# Patient Record
Sex: Female | Born: 1955 | Race: Black or African American | Hispanic: No | Smoking: Never smoker
Health system: Southern US, Community
[De-identification: ages and names within clinical notes are randomized; demographics above are authoritative.]

## PROBLEM LIST (undated history)

## (undated) DIAGNOSIS — R011 Cardiac murmur, unspecified: Secondary | ICD-10-CM

## (undated) DIAGNOSIS — J45909 Unspecified asthma, uncomplicated: Secondary | ICD-10-CM

## (undated) DIAGNOSIS — D649 Anemia, unspecified: Secondary | ICD-10-CM

## (undated) DIAGNOSIS — T7840XA Allergy, unspecified, initial encounter: Secondary | ICD-10-CM

## (undated) DIAGNOSIS — K259 Gastric ulcer, unspecified as acute or chronic, without hemorrhage or perforation: Secondary | ICD-10-CM

## (undated) DIAGNOSIS — I10 Essential (primary) hypertension: Secondary | ICD-10-CM

## (undated) DIAGNOSIS — F419 Anxiety disorder, unspecified: Secondary | ICD-10-CM

## (undated) HISTORY — PX: DILATION AND CURETTAGE OF UTERUS: SHX78

## (undated) HISTORY — DX: Gastric ulcer, unspecified as acute or chronic, without hemorrhage or perforation: K25.9

## (undated) HISTORY — PX: WISDOM TOOTH EXTRACTION: SHX21

## (undated) HISTORY — PX: OTHER SURGICAL HISTORY: SHX169

## (undated) HISTORY — DX: Anxiety disorder, unspecified: F41.9

## (undated) HISTORY — DX: Cardiac murmur, unspecified: R01.1

## (undated) HISTORY — DX: Essential (primary) hypertension: I10

## (undated) HISTORY — DX: Allergy, unspecified, initial encounter: T78.40XA

## (undated) HISTORY — DX: Anemia, unspecified: D64.9

---

## 1986-05-29 HISTORY — PX: PILONIDAL CYST EXCISION: SHX744

## 2010-11-22 ENCOUNTER — Inpatient Hospital Stay (INDEPENDENT_AMBULATORY_CARE_PROVIDER_SITE_OTHER)
Admission: RE | Admit: 2010-11-22 | Discharge: 2010-11-22 | Disposition: A | Discharge status: Home / Self Care | Payer: Self-pay | Source: Ambulatory Visit | Attending: Family Medicine | Admitting: Family Medicine

## 2010-11-22 DIAGNOSIS — I10 Essential (primary) hypertension: Secondary | ICD-10-CM

## 2010-11-22 LAB — POCT I-STAT, CHEM 8
Calcium, Ion: 1.16 mmol/L (ref 1.12–1.32)
Glucose, Bld: 113 mg/dL — ABNORMAL HIGH (ref 70–99)
HCT: 40 % (ref 36.0–46.0)
Hemoglobin: 13.6 g/dL (ref 12.0–15.0)

## 2011-05-30 DIAGNOSIS — K259 Gastric ulcer, unspecified as acute or chronic, without hemorrhage or perforation: Secondary | ICD-10-CM

## 2011-05-30 HISTORY — DX: Gastric ulcer, unspecified as acute or chronic, without hemorrhage or perforation: K25.9

## 2011-12-24 ENCOUNTER — Inpatient Hospital Stay (HOSPITAL_COMMUNITY)
Admission: EM | Admit: 2011-12-24 | Discharge: 2011-12-28 | DRG: 383 | Disposition: A | Discharge status: Home / Self Care | Payer: PRIVATE HEALTH INSURANCE | Attending: Internal Medicine | Admitting: Internal Medicine

## 2011-12-24 ENCOUNTER — Encounter (HOSPITAL_COMMUNITY): Payer: Self-pay | Admitting: Cardiology

## 2011-12-24 ENCOUNTER — Emergency Department (HOSPITAL_COMMUNITY): Payer: PRIVATE HEALTH INSURANCE

## 2011-12-24 ENCOUNTER — Emergency Department (HOSPITAL_COMMUNITY)
Admission: EM | Admit: 2011-12-24 | Discharge: 2011-12-24 | Disposition: A | Discharge status: Nursing Home (Includes ICF) | Payer: Self-pay | Source: Home / Self Care | Attending: Family Medicine | Admitting: Family Medicine

## 2011-12-24 DIAGNOSIS — IMO0001 Reserved for inherently not codable concepts without codable children: Secondary | ICD-10-CM | POA: Diagnosis present

## 2011-12-24 DIAGNOSIS — J45909 Unspecified asthma, uncomplicated: Secondary | ICD-10-CM | POA: Diagnosis present

## 2011-12-24 DIAGNOSIS — R1013 Epigastric pain: Secondary | ICD-10-CM | POA: Diagnosis present

## 2011-12-24 DIAGNOSIS — K859 Acute pancreatitis without necrosis or infection, unspecified: Secondary | ICD-10-CM | POA: Diagnosis present

## 2011-12-24 DIAGNOSIS — R0789 Other chest pain: Secondary | ICD-10-CM

## 2011-12-24 DIAGNOSIS — Z6841 Body Mass Index (BMI) 40.0 and over, adult: Secondary | ICD-10-CM

## 2011-12-24 DIAGNOSIS — R112 Nausea with vomiting, unspecified: Secondary | ICD-10-CM | POA: Diagnosis present

## 2011-12-24 DIAGNOSIS — R030 Elevated blood-pressure reading, without diagnosis of hypertension: Secondary | ICD-10-CM | POA: Diagnosis present

## 2011-12-24 DIAGNOSIS — R110 Nausea: Secondary | ICD-10-CM

## 2011-12-24 DIAGNOSIS — Z7982 Long term (current) use of aspirin: Secondary | ICD-10-CM

## 2011-12-24 DIAGNOSIS — J4520 Mild intermittent asthma, uncomplicated: Secondary | ICD-10-CM | POA: Diagnosis present

## 2011-12-24 DIAGNOSIS — K269 Duodenal ulcer, unspecified as acute or chronic, without hemorrhage or perforation: Principal | ICD-10-CM | POA: Diagnosis present

## 2011-12-24 DIAGNOSIS — Z79899 Other long term (current) drug therapy: Secondary | ICD-10-CM

## 2011-12-24 HISTORY — DX: Unspecified asthma, uncomplicated: J45.909

## 2011-12-24 LAB — COMPREHENSIVE METABOLIC PANEL
ALT: 17 U/L (ref 0–35)
AST: 25 U/L (ref 0–37)
Albumin: 3.6 g/dL (ref 3.5–5.2)
CO2: 27 mEq/L (ref 19–32)
Calcium: 9 mg/dL (ref 8.4–10.5)
GFR calc non Af Amer: >90 mL/min (ref >90)
Sodium: 139 mEq/L (ref 135–145)
Total Protein: 7.6 g/dL (ref 6.0–8.3)

## 2011-12-24 LAB — CBC WITH DIFFERENTIAL/PLATELET
Basophils Absolute: 0 10*3/uL (ref 0.0–0.1)
Eosinophils Absolute: 0.1 10*3/uL (ref 0.0–0.7)
Lymphocytes Relative: 44 % (ref 12–46)
Lymphs Abs: 3.1 10*3/uL (ref 0.7–4.0)
MCH: 24.9 pg — ABNORMAL LOW (ref 26.0–34.0)
Neutrophils Relative %: 43 % (ref 43–77)
Platelets: 312 10*3/uL (ref 150–400)
RBC: 4.49 MIL/uL (ref 3.87–5.11)
WBC: 6.9 10*3/uL (ref 4.0–10.5)

## 2011-12-24 LAB — POCT I-STAT, CHEM 8
Hemoglobin: 12.2 g/dL (ref 12.0–15.0)
Sodium: 140 mEq/L (ref 135–145)
TCO2: 23 mmol/L (ref 0–100)

## 2011-12-24 LAB — POCT URINALYSIS DIP (DEVICE)
Leukocytes, UA: NEGATIVE
Protein, ur: NEGATIVE mg/dL
Urobilinogen, UA: 0.2 mg/dL (ref 0.0–1.0)

## 2011-12-24 LAB — TROPONIN I: Troponin I: <0.3 ng/mL (ref <0.30)

## 2011-12-24 MED ORDER — SODIUM CHLORIDE 0.9 % IV BOLUS (SEPSIS)
1000.0000 mL | Freq: Once | INTRAVENOUS | Status: AC
  Administered 2011-12-24: 1000 mL via INTRAVENOUS

## 2011-12-24 MED ORDER — SODIUM CHLORIDE 0.9 % IV SOLN: INTRAVENOUS | Status: DC

## 2011-12-24 MED ORDER — ONDANSETRON HCL 4 MG/2ML IJ SOLN
4.0000 mg | Freq: Once | INTRAMUSCULAR | Status: AC
  Administered 2011-12-24: 4 mg via INTRAVENOUS
  Filled 2011-12-24: qty 2

## 2011-12-24 MED ORDER — ASPIRIN 81 MG PO CHEW
CHEWABLE_TABLET | ORAL | Status: AC
  Filled 2011-12-24: qty 4

## 2011-12-24 MED ORDER — ASPIRIN 81 MG PO CHEW
324.0000 mg | CHEWABLE_TABLET | Freq: Once | ORAL | Status: AC
  Administered 2011-12-24: 324 mg via ORAL

## 2011-12-24 MED ORDER — ONDANSETRON HCL 4 MG/2ML IJ SOLN
INTRAMUSCULAR | Status: AC
  Filled 2011-12-24: qty 2

## 2011-12-24 MED ORDER — ONDANSETRON HCL 4 MG/2ML IJ SOLN
4.0000 mg | Freq: Once | INTRAMUSCULAR | Status: AC
  Administered 2011-12-24: 4 mg via INTRAVENOUS

## 2011-12-24 NOTE — ED Notes (Signed)
Patient transfered from Urgent Care via [facility]. [facility] reports onset of chest pain 2 months ago. Currently pain free.

## 2011-12-24 NOTE — Progress Notes (Signed)
10:58 PM 56 yo woman sent from urgent care center because of "chest pain."  What she had was abdominal pain.  EKG and cardiac markers negative.  Lipase elevated at 283.  Recommend abdominal Ultrasound to check for gallstones.

## 2011-12-24 NOTE — ED Notes (Signed)
[facility] notified for transport 

## 2011-12-24 NOTE — ED Provider Notes (Signed)
History     CSN: [id number]  Arrival date & time 12/24/11  1641   First MD Initiated Contact with Patient 12/24/11 1710      Chief Complaint  Patient presents with  . Nausea  . Dizziness  . Abdominal Pain    (Consider location/radiation/quality/duration/timing/severity/associated sxs/prior treatment) HPI Comments: 56 year old female nondiabetic here complaining of epigastric pain associated with diaphoresis, dizziness and chest pressure intermittently for the last 2 months. Worse is getting outside or exerting. Recurrent symptoms today patient reports epigastric pain associated with cold sweat, dizziness and nausea after eating lunch at 11 AM also reports associated feeling of chest heaviness "like an elephant sitting in my chest"  here with persistent epigastric discomfort and nausea also reports persistent chest pressure type of discomfort.    Past Medical History  Diagnosis Date  . Asthma     as child    History reviewed. No pertinent past surgical history.  No family history on file.  History  Substance Use Topics  . Smoking status: Never Smoker   . Smokeless tobacco: Not on file  . Alcohol Use: No    OB History    Grav Para Term Preterm Abortions TAB SAB Ect Mult Living                  Review of Systems  Constitutional: Positive for diaphoresis. Negative for fever and chills.  Respiratory: Positive for chest tightness.   Cardiovascular: Negative for palpitations and leg swelling.  Gastrointestinal: Positive for nausea and abdominal pain. Negative for vomiting and diarrhea.  Genitourinary: Negative for dysuria.  Skin: Negative for rash.  Neurological: Positive for dizziness.  All other systems reviewed and are negative.    Allergies  Penicillins  Home Medications   Current Outpatient Rx  Name Route Sig Dispense Refill  . ASPIRIN 325 MG PO TABS Oral Take 325 mg by mouth daily.    Marland Kitchen CALCIUM CARBONATE ANTACID 500 MG PO CHEW Oral Chew 2 tablets by mouth  as needed.      BP 197/88  Pulse 70  Temp 97.9 F (36.6 C) (Oral)  Resp 20  SpO2 98%  Physical Exam  Nursing note and vitals reviewed. Constitutional: She is oriented to person, place, and time. She appears well-developed and well-nourished.       Looks uncomfortable  HENT:  Head: Normocephalic and atraumatic.  Mouth/Throat: No oropharyngeal exudate.  Eyes: Conjunctivae and EOM are normal. Pupils are equal, round, and reactive to light. No scleral icterus.  Neck: Neck supple. No JVD present. No thyromegaly present.  Cardiovascular: Normal rate, regular rhythm and normal heart sounds.  Exam reveals no gallop and no friction rub.   No murmur heard. Pulmonary/Chest: Effort normal and breath sounds normal. No respiratory distress. She has no wheezes. She has no rales. She exhibits no tenderness.  Abdominal: Soft. Bowel sounds are normal. She exhibits no distension and no mass. There is no rebound and no guarding.       Epigastric tenderness  Lymphadenopathy:    She has no cervical adenopathy.  Neurological: She is alert and oriented to person, place, and time.  Skin: No rash noted.    ED Course  Procedures (including critical care time)   Labs Reviewed  POCT URINALYSIS DIP (DEVICE)  POCT I-STAT, CHEM 8   No results found.   1. Nausea   2. Epigastric pain   3. Chest discomfort       MDM  56 year old female nondiabetic here complaining of epigastric pain  associated with diaphoresis and chest pressure intermittently for the last 2 months. Recurrent symptoms today patient reports epigastric pain diaphoresis and nausea after eating lunch at 11 AM here with persistent epigastric discomfort and nausea also reports perssitent chest pressure. Non diaphoretic here.  EKG: Ventricular rate 59 beats per minute. With T-wave he inversions in D1, D2 aVR aVL and lateral leads (V5,V6). No ST changes or other acute ischemic changes. Vital signs stable with blood pressure with a wide  differential 197/88. Possible angina pectoris, concerned about acute coronary syndrome vs thoracic aortic disease. Decided to transfer to the emergency department via EMS. Ondansetron 4 mg IV administered at [facility] prior to discharge.         Sharin Grave, MD 12/25/11 1136

## 2011-12-24 NOTE — ED Notes (Addendum)
Pt reports nausea, dizziness, abdominal pain that feels like a "runners cramp" for the 2 months. Pt thought is was the flu back in June but has not felt good sent. Pt had diarrhea episode at the onset of symptoms back in June with bright red blood in it. Pt reports temp at home 98.7 that feels warm for her. Pt has not been able to drink water as she was 2 weeks ago now only able to sip. Pt reports chest feeling like something is sitting on chest and won't move. Worse when outside.

## 2011-12-24 NOTE — ED Notes (Signed)
Pt given 4 baby aspirin prior to departure for [facility] ED. Report given to [facility] with [facility]. Pt to [facility] ED.

## 2011-12-24 NOTE — ED Notes (Signed)
Patient C/O nausea for 2 months.  Nausea is intermittent and is brought on by drinking water. Patient is asymptomatic at this time. States that she cannot handle feeling bad any longer.

## 2011-12-24 NOTE — ED Notes (Signed)
Report called to [place] Ana at [facility] ED.

## 2011-12-25 ENCOUNTER — Emergency Department (HOSPITAL_COMMUNITY): Payer: PRIVATE HEALTH INSURANCE

## 2011-12-25 ENCOUNTER — Encounter (HOSPITAL_COMMUNITY): Payer: Self-pay | Admitting: *Deleted

## 2011-12-25 DIAGNOSIS — R112 Nausea with vomiting, unspecified: Secondary | ICD-10-CM | POA: Diagnosis present

## 2011-12-25 DIAGNOSIS — K859 Acute pancreatitis without necrosis or infection, unspecified: Secondary | ICD-10-CM

## 2011-12-25 DIAGNOSIS — J45909 Unspecified asthma, uncomplicated: Secondary | ICD-10-CM

## 2011-12-25 DIAGNOSIS — R030 Elevated blood-pressure reading, without diagnosis of hypertension: Secondary | ICD-10-CM

## 2011-12-25 DIAGNOSIS — J4520 Mild intermittent asthma, uncomplicated: Secondary | ICD-10-CM | POA: Diagnosis present

## 2011-12-25 DIAGNOSIS — IMO0001 Reserved for inherently not codable concepts without codable children: Secondary | ICD-10-CM | POA: Diagnosis present

## 2011-12-25 DIAGNOSIS — R1013 Epigastric pain: Secondary | ICD-10-CM | POA: Diagnosis present

## 2011-12-25 DIAGNOSIS — R52 Pain, unspecified: Secondary | ICD-10-CM

## 2011-12-25 LAB — POCT I-STAT TROPONIN I

## 2011-12-25 MED ORDER — ONDANSETRON HCL 4 MG/2ML IJ SOLN: 4.0000 mg | Freq: Four times a day (QID) | INTRAMUSCULAR | Status: DC | PRN

## 2011-12-25 MED ORDER — ONDANSETRON HCL 4 MG/2ML IJ SOLN
4.0000 mg | Freq: Once | INTRAMUSCULAR | Status: AC
  Administered 2011-12-25: 4 mg via INTRAVENOUS
  Filled 2011-12-25: qty 2

## 2011-12-25 MED ORDER — ONDANSETRON HCL 4 MG PO TABS: 4.0000 mg | ORAL_TABLET | Freq: Four times a day (QID) | ORAL | Status: DC | PRN

## 2011-12-25 MED ORDER — MORPHINE SULFATE 2 MG/ML IJ SOLN
2.0000 mg | INTRAMUSCULAR | Status: DC | PRN
  Administered 2011-12-25: 2 mg via INTRAVENOUS
  Filled 2011-12-25: qty 2

## 2011-12-25 MED ORDER — HYDRALAZINE HCL 20 MG/ML IJ SOLN
5.0000 mg | INTRAMUSCULAR | Status: DC | PRN
  Administered 2011-12-25 – 2011-12-28 (×5): 5 mg via INTRAVENOUS
  Filled 2011-12-25 (×3): qty 0.25

## 2011-12-25 MED ORDER — MORPHINE SULFATE 4 MG/ML IJ SOLN: 4.0000 mg | INTRAMUSCULAR | Status: DC | PRN

## 2011-12-25 MED ORDER — SODIUM CHLORIDE 0.9 % IV SOLN
INTRAVENOUS | Status: AC
  Administered 2011-12-25 (×2): via INTRAVENOUS

## 2011-12-25 MED ORDER — SODIUM CHLORIDE 0.9 % IV SOLN
INTRAVENOUS | Status: DC
  Administered 2011-12-25: 04:00:00 via INTRAVENOUS

## 2011-12-25 MED ORDER — SODIUM CHLORIDE 0.9 % IV SOLN: INTRAVENOUS | Status: DC

## 2011-12-25 MED ORDER — HYDROMORPHONE HCL PF 1 MG/ML IJ SOLN
1.0000 mg | INTRAMUSCULAR | Status: AC
  Administered 2011-12-25: 1 mg via INTRAVENOUS
  Filled 2011-12-25: qty 1

## 2011-12-25 MED ORDER — MORPHINE SULFATE 2 MG/ML IJ SOLN
2.0000 mg | INTRAMUSCULAR | Status: DC | PRN
  Administered 2011-12-25: 2 mg via INTRAVENOUS
  Filled 2011-12-25 (×2): qty 1

## 2011-12-25 MED ORDER — MORPHINE SULFATE 2 MG/ML IJ SOLN
1.0000 mg | INTRAMUSCULAR | Status: DC | PRN
  Administered 2011-12-25: 1 mg via INTRAVENOUS
  Filled 2011-12-25: qty 1

## 2011-12-25 MED ORDER — ONDANSETRON HCL 4 MG/2ML IJ SOLN: 4.0000 mg | Freq: Three times a day (TID) | INTRAMUSCULAR | Status: DC | PRN

## 2011-12-25 NOTE — ED Notes (Signed)
Patient to US via stretcher

## 2011-12-25 NOTE — ED Notes (Signed)
Patient continues to await [place] of abd. States that she pain medication help a little. No acute distress noted at present.

## 2011-12-25 NOTE — ED Provider Notes (Signed)
Medical screening examination/treatment/procedure(s) were performed by non-physician practitioner and as supervising physician I was immediately available for consultation/collaboration.   Can Lucci M Belinda Bringhurst, DO 12/25/11 1038 

## 2011-12-25 NOTE — Progress Notes (Signed)
Notified Craige Cotta, NP by text page that patient's BP is 175/79 HR 58. No call returned. Will continue to monitor patient. Nelda Marseille, RN

## 2011-12-25 NOTE — Progress Notes (Signed)
-  NPO, continue IV fluids IV narcotics. Alejandra Vega sign positive, with significant epigastric tenderness. -Get a renal ultrasound to rule out stones. -LFTs within normal limits triglycerides within normal limits she does not consume alcohol.

## 2011-12-25 NOTE — ED Notes (Signed)
Patient reports having abd pain. EDP

## 2011-12-25 NOTE — ED Provider Notes (Signed)
4:09 AM Spoke with Dr. Onalee Hua who will accept the patient for admission for pancreatitis. Discussed with patient and Dr. Clarene Duke , Who agree with plan   Results for orders placed during the hospital encounter of 12/24/11  CBC WITH DIFFERENTIAL      Component Value Range   WBC 6.9  4.0 - 10.5 K/uL   RBC 4.49  3.87 - 5.11 MIL/uL   Hemoglobin 11.2 (*) 12.0 - 15.0 g/dL   HCT 16.1 (*) 09.6 - 04.5 %   MCV 78.2  78.0 - 100.0 fL   MCH 24.9 (*) 26.0 - 34.0 pg   MCHC 31.9  30.0 - 36.0 g/dL   RDW 40.9 (*) 81.1 - 91.4 %   Platelets 312  150 - 400 K/uL   Neutrophils Relative 43  43 - 77 %   Neutro Abs 3.0  1.7 - 7.7 K/uL   Lymphocytes Relative 44  12 - 46 %   Lymphs Abs 3.1  0.7 - 4.0 K/uL   Monocytes Relative 10  3 - 12 %   Monocytes Absolute 0.7  0.1 - 1.0 K/uL   Eosinophils Relative 2  0 - 5 %   Eosinophils Absolute 0.1  0.0 - 0.7 K/uL   Basophils Relative 1  0 - 1 %   Basophils Absolute 0.0  0.0 - 0.1 K/uL  TROPONIN I      Component Value Range   Troponin I <0.30  <0.30 ng/mL  COMPREHENSIVE METABOLIC PANEL      Component Value Range   Sodium 139  135 - 145 mEq/L   Potassium 4.2  3.5 - 5.1 mEq/L   Chloride 105  96 - 112 mEq/L   CO2 27  19 - 32 mEq/L   Glucose, Bld 97  70 - 99 mg/dL   BUN 9  6 - 23 mg/dL   Creatinine, Ser 7.82  0.50 - 1.10 mg/dL   Calcium 9.0  8.4 - 95.6 mg/dL   Total Protein 7.6  6.0 - 8.3 g/dL   Albumin 3.6  3.5 - 5.2 g/dL   AST 25  0 - 37 U/L   ALT 17  0 - 35 U/L   Alkaline Phosphatase 75  39 - 117 U/L   Total Bilirubin 0.4  0.3 - 1.2 mg/dL   GFR calc non Af Amer >90  >90 mL/min   GFR calc Af Amer >90  >90 mL/min  LIPASE, BLOOD      Component Value Range   Lipase 283 (*) 11 - 59 U/L   Dg Chest 2 View  12/24/2011  *RADIOLOGY REPORT*  Clinical Data: Epigastric pain off and on for 2 months.  Shortness of breath.  CHEST - 2 VIEW  Comparison: None  Findings: Heart is mildly enlarged.  No focal consolidations or pleural effusions.  No pulmonary edema. Visualized  osseous structures have a normal appearance.  IMPRESSION:  1.  Cardiomegaly. 2. No evidence for acute pulmonary abnormality.  Original Report Authenticated By: Patterson Hammersmith, M.D.   US Abdomen Complete  12/25/2011  *RADIOLOGY REPORT*  Clinical Data:  Epigastric pain, elevated lipase  COMPLETE ABDOMINAL ULTRASOUND  Comparison:  None.  Findings:  Gallbladder:  No gallstones, gallbladder wall thickening, or pericholecystic fluid.  Common bile duct:  Measures 4 mm where seen proximally.  The distal duct is obscured.  Liver:  Mildly heterogeneous/increased in echogenicity.  This limits focal liver lesion detection.  IVC:  Appears normal.  Pancreas:  Poorly visualized due to overlying bowel gas  artifact.  Spleen:  Measures proximally 9 cm.  No focal abnormality.  Right Kidney:  Measures 11.0 cm.  No hydronephrosis or focal abnormality.  Left Kidney:  Measures 11.6 cm.  Hydronephrosis or focal abnormality.  Abdominal aorta:  Measures up to 2.2 cm proximally.  The distal aorta / bifurcation is obscured by overlying bowel gas artifact.  IMPRESSION: No cholelithiasis or sonographic evidence for cholecystitis.  Heterogeneous liver echogenicity may reflect fatty infiltration. Focal lesion detection is limited in the setting.  Pancreas is poorly visualized.  Original Report Authenticated By: Waneta Martins, M.D.      Thomasene Lot, PA-C 12/25/11 0410

## 2011-12-25 NOTE — ED Provider Notes (Signed)
History     CSN: [id number]  Arrival date & time 12/24/11  1952   First MD Initiated Contact with Patient 12/24/11 2033      Chief Complaint  Patient presents with  . Chest Pain    (Consider location/radiation/quality/duration/timing/severity/associated sxs/prior treatment) HPI  Pt to the ER from the Urgent Care with complaints of epigastric pain. She denies having chest pain or SOB but does admit that she has had some chest pressure over the past month but not in the past couple of days. She believes that she is having indigestion and feels that she is fine. She denies that she has had vomiting or diarrhea but admits to extreme nausea.  She denies having LUQ or RLQ abdominal pain.She has not had any temperature. She is in NAD and is unsure of why the UC sent her here.   Past Medical History  Diagnosis Date  . Asthma     as child    No past surgical history on file.  No family history on file.  History  Substance Use Topics  . Smoking status: Never Smoker   . Smokeless tobacco: Not on file  . Alcohol Use: No    OB History    Grav Para Term Preterm Abortions TAB SAB Ect Mult Living                  Review of Systems   HEENT: denies blurry vision or change in hearing PULMONARY: Denies difficulty breathing and SOB CARDIAC: denies chest pain or heart palpitations MUSCULOSKELETAL:  denies being unable to ambulate ABDOMEN AL: denies diarrhea GU: denies loss of bowel or urinary control NEURO: denies numbness and tingling in extremities SKIN: no new rashes PSYCH: patient denies anxiety or depression. NECK: Pt denies having neck pain     Allergies  Glutethimides; Penicillins; Shrimp; and Pork-derived products  Home Medications   Current Outpatient Rx  Name Route Sig Dispense Refill  . ASPIRIN 325 MG PO TABS Oral Take 325 mg by mouth daily.    Marland Kitchen CALCIUM CARBONATE ANTACID 500 MG PO CHEW Oral Chew 2 tablets by mouth as needed.    Marland Kitchen CETIRIZINE HCL 10 MG PO TABS  Oral Take 10 mg by mouth daily.    Marland Kitchen NAPHAZOLINE-GLYCERIN 0.012-0.2 % OP SOLN Both Eyes Place 1-2 drops into both eyes every 4 (four) hours as needed. For allergy relief      BP 150/97  Pulse 70  Resp 18  SpO2 98%  Physical Exam  Nursing note and vitals reviewed. Constitutional: She appears well-developed and well-nourished. No distress.  HENT:  Head: Normocephalic and atraumatic.  Eyes: Pupils are equal, round, and reactive to light.  Neck: Normal range of motion. Neck supple.  Cardiovascular: Normal rate and regular rhythm.   Pulmonary/Chest: Effort normal. She has no wheezes. She has no rales.  Abdominal: Soft. Bowel sounds are normal. There is tenderness in the epigastric area. There is no rigidity, no rebound, no guarding, no CVA tenderness, no tenderness at McBurney's point and negative Murphy's sign.    Neurological: She is alert.  Skin: Skin is warm and dry.    ED Course  Procedures (including critical care time)  Labs Reviewed  CBC WITH DIFFERENTIAL - Abnormal; Notable for the following:    Hemoglobin 11.2 (*)     HCT 35.1 (*)     MCH 24.9 (*)     RDW 15.8 (*)     All other components within normal limits  LIPASE, BLOOD -  Abnormal; Notable for the following:    Lipase 283 (*)     All other components within normal limits  TROPONIN I  COMPREHENSIVE METABOLIC PANEL   Dg Chest 2 View  12/24/2011  *RADIOLOGY REPORT*  Clinical Data: Epigastric pain off and on for 2 months.  Shortness of breath.  CHEST - 2 VIEW  Comparison: None  Findings: Heart is mildly enlarged.  No focal consolidations or pleural effusions.  No pulmonary edema. Visualized osseous structures have a normal appearance.  IMPRESSION:  1.  Cardiomegaly. 2. No evidence for acute pulmonary abnormality.  Original Report Authenticated By: Patterson Hammersmith, M.D.     No diagnosis found.    MDM  Dr. Ignacia Palma has seen patient as well.  Highly doubt patients symptoms are cardiac. Patient lipase is  elevated, no hx of pancreatitis, or gall bladder disease. She denies being a big alcohol drinker. Dr. Ignacia Palma has advised that I order a US abdomen to r/o gall bladder disease.   End of shift care, pt still waiting for [place] abd, pt care handed over to [place], [place].       Dorthula Matas, PA 12/25/11 [phone]

## 2011-12-25 NOTE — Progress Notes (Signed)
Patient's B/P 196/85. Dr. Truitt Merle. Will continue to monitor.

## 2011-12-25 NOTE — ED Notes (Signed)
Pt updated about poss admission to hosp

## 2011-12-25 NOTE — H&P (Signed)
Chief Complaint:  abd pain  HPI: 56 yo female with on/off epi abd pain for over a week with radiation to ruq and associated nausea with minimal vomiting.  Nonbloody.  She thought it was heartburn but over the last several days has gotten severe.  No diarrhea.  No fevers.  Still has gallbladder.  Asked to admit for acute panc,.  Review of Systems:  O/w neg no new meds recetnly  Past Medical History: Past Medical History  Diagnosis Date  . Asthma     as child   No past surgical history on file.  Medications: Prior to Admission medications   Medication Sig Start Date End Date Taking? Authorizing Provider  aspirin 325 MG tablet Take 325 mg by mouth daily.   Yes Historical Provider, MD  calcium carbonate (TUMS - DOSED IN MG ELEMENTAL CALCIUM) 500 MG chewable tablet Chew 2 tablets by mouth as needed.   Yes Historical Provider, MD  cetirizine (ZYRTEC) 10 MG tablet Take 10 mg by mouth daily.   Yes Historical Provider, MD  naphazoline-glycerin (CLEAR EYES) 0.012-0.2 % SOLN Place 1-2 drops into both eyes every 4 (four) hours as needed. For allergy relief   Yes Historical Provider, MD    Allergies:   Allergies  Allergen Reactions  . Glutethimides Shortness Of Breath  . Penicillins Hives, Shortness Of Breath, Itching and Rash  . Shrimp (Shellfish Allergy) Anaphylaxis  . Pork-Derived Products Other (See Comments)    Head ache     Social History:  reports that she has never smoked. She does not have any smokeless tobacco history on file. She reports that she does not drink alcohol or use illicit drugs.  Physical Exam: Filed Vitals:   12/25/11 0100 12/25/11 0200 12/25/11 0330 12/25/11 0400  BP: 153/94 146/95 151/96 135/87  Pulse: 65 61 68 61  Resp: 16 16 15 13   SpO2: 96% 98% 97% 97%   General appearance: alert, cooperative and no distress Lungs: clear to auscultation bilaterally Heart: regular rate and rhythm, S1, S2 normal, no murmur, click, rub or gallop Abdomen: soft,  non-tender; bowel sounds normal; no masses,  no organomegaly Extremities: extremities normal, atraumatic, no cyanosis or edema Pulses: 2+ and symmetric Skin: Skin color, texture, turgor normal. No rashes or lesions Neurologic: Grossly normal    Labs on Admission:   [facility] 12/24/11 2058 12/24/11 1807  NA 139 140  K 4.2 3.9  CL 105 106  CO2 27 --  GLUCOSE 97 93  BUN 9 10  CREATININE 0.67 0.80  CALCIUM 9.0 --  MG -- --  PHOS -- --    Basename 12/24/11 2058  AST 25  ALT 17  ALKPHOS 75  BILITOT 0.4  PROT 7.6  ALBUMIN 3.6    Basename 12/24/11 2058  LIPASE 283*  AMYLASE --    Basename 12/24/11 2058 12/24/11 1807  WBC 6.9 --  NEUTROABS 3.0 --  HGB 11.2* 12.2  HCT 35.1* 36.0  MCV 78.2 --  PLT 312 --    Basename 12/24/11 2100  CKTOTAL --  CKMB --  CKMBINDEX --  TROPONINI <0.30   Radiological Exams on Admission: Dg Chest 2 View  12/24/2011  *RADIOLOGY REPORT*  Clinical Data: Epigastric pain off and on for 2 months.  Shortness of breath.  CHEST - 2 VIEW  Comparison: None  Findings: Heart is mildly enlarged.  No focal consolidations or pleural effusions.  No pulmonary edema. Visualized osseous structures have a normal appearance.  IMPRESSION:  1.  Cardiomegaly. 2. No  evidence for acute pulmonary abnormality.  Original Report Authenticated By: Patterson Hammersmith, M.D.   US Abdomen Complete  12/25/2011  *RADIOLOGY REPORT*  Clinical Data:  Epigastric pain, elevated lipase  COMPLETE ABDOMINAL ULTRASOUND  Comparison:  None.  Findings:  Gallbladder:  No gallstones, gallbladder wall thickening, or pericholecystic fluid.  Common bile duct:  Measures 4 mm where seen proximally.  The distal duct is obscured.  Liver:  Mildly heterogeneous/increased in echogenicity.  This limits focal liver lesion detection.  IVC:  Appears normal.  Pancreas:  Poorly visualized due to overlying bowel gas artifact.  Spleen:  Measures proximally 9 cm.  No focal abnormality.  Right Kidney:  Measures  11.0 cm.  No hydronephrosis or focal abnormality.  Left Kidney:  Measures 11.6 cm.  Hydronephrosis or focal abnormality.  Abdominal aorta:  Measures up to 2.2 cm proximally.  The distal aorta / bifurcation is obscured by overlying bowel gas artifact.  IMPRESSION: No cholelithiasis or sonographic evidence for cholecystitis.  Heterogeneous liver echogenicity may reflect fatty infiltration. Focal lesion detection is limited in the setting.  Pancreas is poorly visualized.  Original Report Authenticated By: Waneta Martins, M.D.    Assessment/Plan Present on Admission:  56 yo female with acute pancreatitis .Nausea & vomiting .Abdominal pain, acute, epigastric .Acute pancreatitis .Asthma .Elevated BP  Acute panc, npo ice chips.  Bowel rest.  Ivf.  lfts and alk phos nml.  May have passed a stone.  Will eventually need surgical eval to see if needs gallbladder out.  Ck trig level.    Ludia Gartland A [phone] 12/25/2011, 4:38 AM

## 2011-12-25 NOTE — ED Provider Notes (Signed)
Medical screening examination/treatment/procedure(s) were conducted as a shared visit with non-physician practitioner(s) and myself.  I personally evaluated the patient during the encounter 10:58 PM  56 yo woman sent from urgent care center because of "chest pain." What she had was abdominal pain. EKG and cardiac markers negative. Lipase elevated at 283. Recommend abdominal Ultrasound to check for gallstones.        Carleene Cooper III, MD 12/25/11 [phone]

## 2011-12-25 NOTE — ED Notes (Signed)
Patient continues to await [place]. No reports of pain.

## 2011-12-25 NOTE — Progress Notes (Signed)
Shyenne Maggard [id number] Admission Data: 12/25/2011 10:03 AM Attending Provider: Marinda Elk, MD  PCP:No primary provider on file. Consults/ Treatment Team:    Allia Sylvain is a 56 y.o. female patient admitted from ED awake, alert  & orientated  X 3,  Full Code, VSS - Blood pressure 123/77, pulse 66, temperature 98.9 F (37.2 C), temperature source Oral, resp. rate 18, SpO2 99.00%., no c/o shortness of breath, no distress noted. .   IV site WDL:  hand left, condition patent and no redness with a transparent dsg that's clean dry and intact.  Allergies:   Allergies  Allergen Reactions  . Glutethimides Shortness Of Breath  . Penicillins Hives, Shortness Of Breath, Itching and Rash  . Shrimp (Shellfish Allergy) Anaphylaxis  . Pork-Derived Products Other (See Comments)    Head ache      Past Medical History  Diagnosis Date  . Asthma     as child    History:  obtained from the patient. Tobacco/alcohol: denied none  Pt orientation to unit, room and routine. Information packet given to patient/family and safety video watched.  Admission INP armband ID verified with patient/family, and in place. SR up x 2, fall risk assessment complete with Patient and family verbalizing understanding of risks associated with falls. Pt verbalizes an understanding of how to use the call bell and to call for help before getting out of bed.  Skin, clean-dry- intact without evidence of bruising, or skin tears.   No evidence of skin break down noted on exam. no rashes, no ecchymoses, no petechiae, no nodules, no jaundice, no purpura, no wounds    Will cont to monitor and assist as needed.  Adilson Grafton Consuella Lose, RN 12/25/2011 10:03 AM

## 2011-12-26 DIAGNOSIS — R112 Nausea with vomiting, unspecified: Secondary | ICD-10-CM

## 2011-12-26 LAB — CBC
Hemoglobin: 11.8 g/dL — ABNORMAL LOW (ref 12.0–15.0)
Platelets: 327 10*3/uL (ref 150–400)
RBC: 4.72 MIL/uL (ref 3.87–5.11)
WBC: 6.9 10*3/uL (ref 4.0–10.5)

## 2011-12-26 LAB — COMPREHENSIVE METABOLIC PANEL
AST: 25 U/L (ref 0–37)
BUN: 6 mg/dL (ref 6–23)
CO2: 29 mEq/L (ref 19–32)
Calcium: 9.6 mg/dL (ref 8.4–10.5)
Creatinine, Ser: 0.73 mg/dL (ref 0.50–1.10)
GFR calc Af Amer: >90 mL/min (ref >90)
GFR calc non Af Amer: >90 mL/min (ref >90)
Glucose, Bld: 98 mg/dL (ref 70–99)

## 2011-12-26 MED ORDER — DEXTROSE-NACL 5-0.45 % IV SOLN
INTRAVENOUS | Status: DC
  Administered 2011-12-26 – 2011-12-27 (×2): via INTRAVENOUS

## 2011-12-26 MED ORDER — ACETAMINOPHEN 325 MG PO TABS
650.0000 mg | ORAL_TABLET | Freq: Once | ORAL | Status: AC
  Administered 2011-12-26: 650 mg via ORAL
  Filled 2011-12-26: qty 2

## 2011-12-26 NOTE — Progress Notes (Signed)
[facility] PROGRESS NOTE  Alejandra Vega EXB:[id number] DOB: Mar 27, 1956 DOA: 12/24/2011   Assessment/Plan: Patient Active Hospital Problem List: Acute pancreatitis (12/25/2011) -continues to have abdominal pain, she tried ice chips and exacerbated her pain. Continue n.p.o., continue IV fluids and IV narcotics. -Unclear etiology, we'll check an ANA.Marland Kitchen  Asthma () -stable continue to monitor.  Elevated BP without diagnosis of hypertension (12/25/2011) -her blood pressure continues to be elevated been fluctuating this most likely secondary to pain.  Code Status: Full code Family Communication: Sister Disposition Plan: To be determined   LOS: 2 days   Subjective: She relates that it shouldn't made her stomach hurt. Is comfortable laying in bed will like to continue n.p.o.  Objective: Filed Vitals:   12/26/11 0453 12/26/11 0605 12/26/11 0642 12/26/11 1320  BP: 189/91 187/83 163/73 151/89  Pulse: 68   71  Temp: 98.3 F (36.8 C)   98.1 F (36.7 C)  TempSrc: Oral   Oral  Resp:      Height:      Weight:      SpO2: 95%   98%    Intake/Output Summary (Last 24 hours) at 12/26/11 1636 Last data filed at 12/26/11 1300  Gross per 24 hour  Intake    915 ml  Output      0 ml  Net    915 ml   Weight change:   Exam:  General: Alert, awake, oriented x3, in no acute distress.  HEENT: No bruits, no goiter.  Heart: Regular rate and rhythm, without murmurs, rubs, gallops.  Lungs: Good air movement, bilateral air movement.  Abdomen: Soft, , nondistended epigastric tenderness positive bowel sounds.   Data Reviewed: Basic Metabolic Panel:  Lab 12/26/11 1027 12/24/11 2058 12/24/11 1807  NA 139 139 140  K 3.8 4.2 --  CL 101 105 106  CO2 29 27 --  GLUCOSE 98 97 93  BUN 6 9 10   CREATININE 0.73 0.67 0.80  CALCIUM 9.6 9.0 --  MG -- -- --  PHOS -- -- --   Liver Function Tests:  Lab 12/26/11 0606 12/24/11 2058  AST 25 25  ALT 17 17  ALKPHOS 76 75  BILITOT 0.5 0.4    PROT 7.8 7.6  ALBUMIN 3.7 3.6    Lab 12/24/11 2058  LIPASE 283*  AMYLASE --   No results found for this basename: AMMONIA:5 in the last 168 hours CBC:  Lab 12/26/11 0606 12/24/11 2058 12/24/11 1807  WBC 6.9 6.9 --  NEUTROABS -- 3.0 --  HGB 11.8* 11.2* 12.2  HCT 37.0 35.1* 36.0  MCV 78.4 78.2 --  PLT 327 312 --   Cardiac Enzymes:  Lab 12/24/11 2100  CKTOTAL --  CKMB --  CKMBINDEX --  TROPONINI <0.30   BNP: No components found with this basename: POCBNP:5 CBG: No results found for this basename: GLUCAP:5 in the last 168 hours  No results found for this or any previous visit (from the past 240 hour(s)).   Studies: Dg Chest 2 View  12/24/2011  *RADIOLOGY REPORT*  Clinical Data: Epigastric pain off and on for 2 months.  Shortness of breath.  CHEST - 2 VIEW  Comparison: None  Findings: Heart is mildly enlarged.  No focal consolidations or pleural effusions.  No pulmonary edema. Visualized osseous structures have a normal appearance.  IMPRESSION:  1.  Cardiomegaly. 2. No evidence for acute pulmonary abnormality.  Original Report Authenticated By: Patterson Hammersmith, M.D.   US Abdomen Complete  12/25/2011  *RADIOLOGY REPORT*  Clinical Data:  Epigastric pain, elevated lipase  COMPLETE ABDOMINAL ULTRASOUND  Comparison:  None.  Findings:  Gallbladder:  No gallstones, gallbladder wall thickening, or pericholecystic fluid.  Common bile duct:  Measures 4 mm where seen proximally.  The distal duct is obscured.  Liver:  Mildly heterogeneous/increased in echogenicity.  This limits focal liver lesion detection.  IVC:  Appears normal.  Pancreas:  Poorly visualized due to overlying bowel gas artifact.  Spleen:  Measures proximally 9 cm.  No focal abnormality.  Right Kidney:  Measures 11.0 cm.  No hydronephrosis or focal abnormality.  Left Kidney:  Measures 11.6 cm.  Hydronephrosis or focal abnormality.  Abdominal aorta:  Measures up to 2.2 cm proximally.  The distal aorta / bifurcation is  obscured by overlying bowel gas artifact.  IMPRESSION: No cholelithiasis or sonographic evidence for cholecystitis.  Heterogeneous liver echogenicity may reflect fatty infiltration. Focal lesion detection is limited in the setting.  Pancreas is poorly visualized.  Original Report Authenticated By: Waneta Martins, M.D.    Scheduled Meds:   . acetaminophen  650 mg Oral Once   Continuous Infusions:   . sodium chloride 75 mL/hr at 12/25/11 1807  . dextrose 5 % and 0.45% NaCl 75 mL/hr at 12/26/11 1255    Lambert Keto, MD  [facility] Pager [phone]  If 7PM-7AM, please contact night-coverage [email or web address] Password [facility] 12/26/2011, 4:36 PM

## 2011-12-26 NOTE — Progress Notes (Signed)
Text paged Craige Cotta, NP two times patient requesting tynenol for headache. Notified Craige Cotta, NP that patient's BP is 187/83, gave hydrazaline IV as ordered, follow up BP 167/73.  Patient states her headache is a little better now. No call returned. Will continue to monitor patient. Nelda Marseille, RN

## 2011-12-27 LAB — ANA: Anti Nuclear Antibody(ANA): NEGATIVE

## 2011-12-27 MED ORDER — ALUM & MAG HYDROXIDE-SIMETH 200-200-20 MG/5ML PO SUSP: 15.0000 mL | ORAL | Status: DC | PRN

## 2011-12-27 MED ORDER — PANTOPRAZOLE SODIUM 40 MG PO TBEC
40.0000 mg | DELAYED_RELEASE_TABLET | Freq: Every day | ORAL | Status: DC
  Administered 2011-12-27 – 2011-12-28 (×2): 40 mg via ORAL
  Filled 2011-12-27: qty 1
  Filled 2011-12-27: qty 2

## 2011-12-27 NOTE — Progress Notes (Signed)
Patient ambulated in hallway with RN this morning. Will continue to monitor patient. Alejandra Vega

## 2011-12-27 NOTE — Progress Notes (Signed)
[facility] PROGRESS NOTE  Alejandra Vega WUJ:[id number] DOB: 1956/01/17 DOA: 12/24/2011   Assessment/Plan: Abdominal pain - suspect patient she may have actually duodenal ulcer - she tells me that her pain is better when eating.  - start PPI and resume diet    Asthma () -stable continue to monitor.  Elevated BP without diagnosis of hypertension (12/25/2011) -her blood pressure continues to be elevated - she may have HTN - will see how she does after po intake is resumed   Code Status: Full code Family Communication: Sister Disposition Plan: To be determined   LOS: 3 days   Subjective: Wants food because it helps with the pain   Objective: Filed Vitals:   12/26/11 2122 12/26/11 2205 12/26/11 2257 12/27/11 0350  BP:  179/95 165/93 161/101  Pulse: 64   75  Temp: 98 F (36.7 C)   98 F (36.7 C)  TempSrc: Oral   Oral  Resp: 18   20  Height:      Weight:      SpO2: 98%   95%    Intake/Output Summary (Last 24 hours) at 12/27/11 1252 Last data filed at 12/27/11 0900  Gross per 24 hour  Intake    815 ml  Output      0 ml  Net    815 ml   Weight change:   Exam:  General: Alert, awake, oriented x3, in no acute distress.  HEENT: No bruits, no goiter.  Heart: Regular rate and rhythm, without murmurs, rubs, gallops.  Lungs: Good air movement, bilateral air movement.  Abdomen: Soft, , has mild epigastric tenderness,  positive bowel sounds.   Data Reviewed: Basic Metabolic Panel:  Lab 12/26/11 9562 12/24/11 2058 12/24/11 1807  NA 139 139 140  K 3.8 4.2 --  CL 101 105 106  CO2 29 27 --  GLUCOSE 98 97 93  BUN 6 9 10   CREATININE 0.73 0.67 0.80  CALCIUM 9.6 9.0 --  MG -- -- --  PHOS -- -- --   Liver Function Tests:  Lab 12/26/11 0606 12/24/11 2058  AST 25 25  ALT 17 17  ALKPHOS 76 75  BILITOT 0.5 0.4  PROT 7.8 7.6  ALBUMIN 3.7 3.6    Lab 12/24/11 2058  LIPASE 283*  AMYLASE --   No results found for this basename: AMMONIA:5 in the last 168  hours CBC:  Lab 12/26/11 0606 12/24/11 2058 12/24/11 1807  WBC 6.9 6.9 --  NEUTROABS -- 3.0 --  HGB 11.8* 11.2* 12.2  HCT 37.0 35.1* 36.0  MCV 78.4 78.2 --  PLT 327 312 --   Cardiac Enzymes:  Lab 12/24/11 2100  CKTOTAL --  CKMB --  CKMBINDEX --  TROPONINI <0.30    Studies: Dg Chest 2 View  12/24/2011  *RADIOLOGY REPORT*  Clinical Data: Epigastric pain off and on for 2 months.  Shortness of breath.  CHEST - 2 VIEW  Comparison: None  Findings: Heart is mildly enlarged.  No focal consolidations or pleural effusions.  No pulmonary edema. Visualized osseous structures have a normal appearance.  IMPRESSION:  1.  Cardiomegaly. 2. No evidence for acute pulmonary abnormality.  Original Report Authenticated By: Patterson Hammersmith, M.D.   US Abdomen Complete  12/25/2011  *RADIOLOGY REPORT*  Clinical Data:  Epigastric pain, elevated lipase  COMPLETE ABDOMINAL ULTRASOUND  Comparison:  None.  Findings:  Gallbladder:  No gallstones, gallbladder wall thickening, or pericholecystic fluid.  Common bile duct:  Measures 4 mm where seen proximally.  The distal duct is obscured.  Liver:  Mildly heterogeneous/increased in echogenicity.  This limits focal liver lesion detection.  IVC:  Appears normal.  Pancreas:  Poorly visualized due to overlying bowel gas artifact.  Spleen:  Measures proximally 9 cm.  No focal abnormality.  Right Kidney:  Measures 11.0 cm.  No hydronephrosis or focal abnormality.  Left Kidney:  Measures 11.6 cm.  Hydronephrosis or focal abnormality.  Abdominal aorta:  Measures up to 2.2 cm proximally.  The distal aorta / bifurcation is obscured by overlying bowel gas artifact.  IMPRESSION: No cholelithiasis or sonographic evidence for cholecystitis.  Heterogeneous liver echogenicity may reflect fatty infiltration. Focal lesion detection is limited in the setting.  Pancreas is poorly visualized.  Original Report Authenticated By: Waneta Martins, M.D.    Scheduled Meds:    . pantoprazole   40 mg Oral Q1200   Continuous Infusions:    . DISCONTD: dextrose 5 % and 0.45% NaCl 75 mL/hr at 12/27/11 0021    Alejandra Vega [phone] If 7PM-7AM, please contact night-coverage [email or web address] Password [facility] 12/27/2011, 12:52 PM

## 2011-12-28 MED ORDER — ACETAMINOPHEN 500 MG PO TABS: 500.0000 mg | ORAL_TABLET | Freq: Four times a day (QID) | ORAL | Status: AC | PRN

## 2011-12-28 MED ORDER — TRIAMTERENE-HCTZ 37.5-25 MG PO CAPS: 1.0000 | ORAL_CAPSULE | ORAL | Status: DC

## 2011-12-28 MED ORDER — ACETAMINOPHEN 325 MG PO TABS
650.0000 mg | ORAL_TABLET | Freq: Four times a day (QID) | ORAL | Status: DC | PRN
  Administered 2011-12-28: 650 mg via ORAL

## 2011-12-28 MED ORDER — CAPSAICIN 0.1 % EX CREA: TOPICAL_CREAM | CUTANEOUS | Status: DC

## 2011-12-28 MED ORDER — OMEPRAZOLE 20 MG PO CPDR: 20.0000 mg | DELAYED_RELEASE_CAPSULE | Freq: Every day | ORAL | Status: DC

## 2011-12-28 NOTE — Discharge Summary (Signed)
Physician Discharge Summary  Alejandra Vega WUJ:[id number] DOB: September 23, 1955 DOA: 12/24/2011  PCP: No primary provider on file.  Admit date: 12/24/2011 Discharge date: 12/28/2011  Recommendations for Outpatient Follow-up:  1. Patient will need followup for hypertension 2. If patient has persistent abdominal pain she will need outpatient referral to gastroenterology for endoscopy  Discharge Diagnoses:  1. acute abdominal pain-most likely due to duodenal ulcer with probable mild acute pancreatitis 2. Hypertension History of asthma   Discharge Condition: Good  Diet recommendation: Regular diet  Wt Readings from Last 3 Encounters:  12/25/11 109.402 kg (241 lb 3 oz)    History of present illness:  56 year old woman presented to the emergency room with worsening progressive abdominal pain. She was found to have an elevated lipase and was referred for admission.   Hospital Course:  1. abdominal pain - Patient was kept initially n.p.o. but without much improvement in her abdominal pain. Once we resumed a diet and place the patient on a proton pump inhibitor she felt significantly better fast. I suspect that she may have a small duodenal ulcer may be related to nsaid use. Plan is to treat her for one month's with PPI and if she does not improve to refer to gastroenterology for endoscopy. 2. Hypertension - patient was initiated on a diuretic at discharge and she was urged to follow up with her primary care physician    Procedures:  Abdominal US  Consultations:  None  Discharge Exam: Filed Vitals:   12/28/11 0650  BP: 146/91  Pulse: 91  Temp:   Resp:    Filed Vitals:   12/27/11 2125 12/27/11 2250 12/28/11 0613 12/28/11 0650  BP: 176/88 168/78 186/102 146/91  Pulse: 75 81 81 91  Temp:  98.2 F (36.8 C) 98.2 F (36.8 C)   TempSrc:  Oral Oral   Resp:   22   Height:      Weight:      SpO2:   98%     General: Alert and oriented x3 Cardiovascular: Regular rate and  rhythm Respiratory: Clear to auscultation bilaterally Abdomen soft nontender bowel sounds are present  Discharge Instructions  Discharge Orders    Future Orders Please Complete By Expires   Diet - low sodium heart healthy      Increase activity slowly        Medication List  As of 12/28/2011 11:08 AM   STOP taking these medications         aspirin 325 MG tablet      naphazoline-glycerin 0.012-0.2 % Soln         TAKE these medications         acetaminophen 500 MG tablet   Commonly known as: TYLENOL   Take 1 tablet (500 mg total) by mouth every 6 (six) hours as needed for pain.      calcium carbonate 500 MG chewable tablet   Commonly known as: TUMS - dosed in mg elemental calcium   Chew 2 tablets by mouth as needed.      Capsaicin 0.1 % Crea   To the right knee as needed      cetirizine 10 MG tablet   Commonly known as: ZYRTEC   Take 10 mg by mouth daily.      omeprazole 20 MG capsule   Commonly known as: PRILOSEC   Take 1 capsule (20 mg total) by mouth daily.      triamterene-hydrochlorothiazide 37.5-25 MG per capsule   Commonly known as: DYAZIDE   Take  1 each (1 capsule total) by mouth every morning.           Follow-up Information    Follow up with [facility].   Contact information:   [phone]          The results of significant diagnostics from this hospitalization (including imaging, microbiology, ancillary and laboratory) are listed below for reference.    Significant Diagnostic Studies: Dg Chest 2 View  12/24/2011  *RADIOLOGY REPORT*  Clinical Data: Epigastric pain off and on for 2 months.  Shortness of breath.  CHEST - 2 VIEW  Comparison: None  Findings: Heart is mildly enlarged.  No focal consolidations or pleural effusions.  No pulmonary edema. Visualized osseous structures have a normal appearance.  IMPRESSION:  1.  Cardiomegaly. 2. No evidence for acute pulmonary abnormality.  Original Report Authenticated By: Patterson Hammersmith, M.D.    US Abdomen Complete  12/25/2011  *RADIOLOGY REPORT*  Clinical Data:  Epigastric pain, elevated lipase  COMPLETE ABDOMINAL ULTRASOUND  Comparison:  None.  Findings:  Gallbladder:  No gallstones, gallbladder wall thickening, or pericholecystic fluid.  Common bile duct:  Measures 4 mm where seen proximally.  The distal duct is obscured.  Liver:  Mildly heterogeneous/increased in echogenicity.  This limits focal liver lesion detection.  IVC:  Appears normal.  Pancreas:  Poorly visualized due to overlying bowel gas artifact.  Spleen:  Measures proximally 9 cm.  No focal abnormality.  Right Kidney:  Measures 11.0 cm.  No hydronephrosis or focal abnormality.  Left Kidney:  Measures 11.6 cm.  Hydronephrosis or focal abnormality.  Abdominal aorta:  Measures up to 2.2 cm proximally.  The distal aorta / bifurcation is obscured by overlying bowel gas artifact.  IMPRESSION: No cholelithiasis or sonographic evidence for cholecystitis.  Heterogeneous liver echogenicity may reflect fatty infiltration. Focal lesion detection is limited in the setting.  Pancreas is poorly visualized.  Original Report Authenticated By: Waneta Martins, M.D.    Microbiology: No results found for this or any previous visit (from the past 240 hour(s)).   Labs: Basic Metabolic Panel:  Lab 12/26/11 8295 12/24/11 2058 12/24/11 1807  NA 139 139 140  K 3.8 4.2 3.9  CL 101 105 106  CO2 29 27 --  GLUCOSE 98 97 93  BUN 6 9 10   CREATININE 0.73 0.67 0.80  CALCIUM 9.6 9.0 --  MG -- -- --  PHOS -- -- --   Liver Function Tests:  Lab 12/26/11 0606 12/24/11 2058  AST 25 25  ALT 17 17  ALKPHOS 76 75  BILITOT 0.5 0.4  PROT 7.8 7.6  ALBUMIN 3.7 3.6    Lab 12/24/11 2058  LIPASE 283*  AMYLASE --   No results found for this basename: AMMONIA:5 in the last 168 hours CBC:  Lab 12/26/11 0606 12/24/11 2058 12/24/11 1807  WBC 6.9 6.9 --  NEUTROABS -- 3.0 --  HGB 11.8* 11.2* 12.2  HCT 37.0 35.1* 36.0  MCV 78.4 78.2 --  PLT 327  312 --   Cardiac Enzymes:  Lab 12/24/11 2100  CKTOTAL --  CKMB --  CKMBINDEX --  TROPONINI <0.30   BNP: BNP (last 3 results) No results found for this basename: PROBNP:3 in the last 8760 hours CBG: No results found for this basename: GLUCAP:5 in the last 168 hours  Time coordinating discharge: 40 minutes  Signed:  Aylah Yeary  [facility] 12/28/2011, 11:08 AM

## 2011-12-28 NOTE — Care Management Note (Addendum)
    Page 1 of 2   01/01/2012     10:05:44 AM   CARE MANAGEMENT NOTE 01/01/2012  Patient:  Alejandra Vega, Alejandra Vega   Account Number:  [id number]  Date Initiated:  12/28/2011  Documentation initiated by:  Letha Cape  Subjective/Objective Assessment:   dx acute pancreatitis  admit- lives alone. pta independent.     Action/Plan:   Anticipated DC Date:  12/28/2011   Anticipated DC Plan:  HOME/SELF CARE      DC Planning Services  CM consult      Choice offered to / List presented to:             Status of service:  Completed, signed off Medicare Important Message given?   (If response is "NO", the following Medicare IM given date fields will be blank) Date Medicare IM given:   Date Additional Medicare IM given:    Discharge Disposition:  HOME/SELF CARE  Per UR Regulation:  Reviewed for med. necessity/level of care/duration of stay  If discussed at Long Length of Stay Meetings, dates discussed:    Comments:  01/01/12 10:03 Letha Cape RN, BSN [phone] spoke with patient, informed her I can make this appt for her is she likes, she states she wanted the appt on 8/23 at 9 am,  Called [facility], received appt for patient on 8/23 at 9:45 am which is the earliest they can see her. Called patient back and left vm message with appt time and date and my call back number if she has any questions.   12/29/11 17:24 Letha Cape RN,BSN [phone] Spoke with patient at [phone], she states she called [facility] to get her follow up appt and they told her that they do not take [employer].  Explained to patient [employer] is not medical insurance, she is considered as self pay (no insurance),  informed her to call on Monday morning and let them know that she knows the initial visit is $50 and she does not have insurance and would like a f/u appt.  I will check back with her on Monday to make sure she got an appt.  12/28/11 16:47 Letha Cape RN, BSN [phone] patient lives alone, pta  independent.  Patient is for dc today, patient is eligible for med ast if needed.

## 2011-12-28 NOTE — Progress Notes (Signed)
Alisi Lupien [id number] Discharge Data: 12/28/2011 3:48 PM Attending Provider: Lorane Gell, MD PCP:No primary provider on file.     Ame Tutton to be D/C'd Home per MD order.  Discussed with the patient the After Visit Summary and all questions fully answered. All IV's discontinued with no bleeding noted. All belongings returned to patient for patient to take home.   Last Vital Signs:  Blood pressure 175/95, pulse 91, temperature 98.5 F (36.9 C), temperature source Oral, resp. rate 20, height 5\' 3"  (1.6 m), weight 109.402 kg (241 lb 3 oz), SpO2 98.00%.  Discharge Medication List Medication List  As of 12/28/2011  3:48 PM   STOP taking these medications         aspirin 325 MG tablet      naphazoline-glycerin 0.012-0.2 % Soln         TAKE these medications         acetaminophen 500 MG tablet   Commonly known as: TYLENOL   Take 1 tablet (500 mg total) by mouth every 6 (six) hours as needed for pain.      calcium carbonate 500 MG chewable tablet   Commonly known as: TUMS - dosed in mg elemental calcium   Chew 2 tablets by mouth as needed.      Capsaicin 0.1 % Crea   To the right knee as needed      cetirizine 10 MG tablet   Commonly known as: ZYRTEC   Take 10 mg by mouth daily.      omeprazole 20 MG capsule   Commonly known as: PRILOSEC   Take 1 capsule (20 mg total) by mouth daily.      triamterene-hydrochlorothiazide 37.5-25 MG per capsule   Commonly known as: DYAZIDE   Take 1 each (1 capsule total) by mouth every morning.           Patient waiting on ride.  Susann Givens, RN, [facility] 12/28/2011 3:48 PM

## 2016-03-08 ENCOUNTER — Encounter: Payer: Self-pay | Admitting: Family Medicine

## 2016-03-08 ENCOUNTER — Ambulatory Visit (INDEPENDENT_AMBULATORY_CARE_PROVIDER_SITE_OTHER): Payer: 59 | Admitting: Family Medicine

## 2016-03-08 VITALS — BP 134/80 | HR 88 | Temp 98.2°F | Resp 16 | Ht 63.0 in | Wt 257.0 lb

## 2016-03-08 DIAGNOSIS — R0609 Other forms of dyspnea: Secondary | ICD-10-CM | POA: Diagnosis not present

## 2016-03-08 DIAGNOSIS — R9431 Abnormal electrocardiogram [ECG] [EKG]: Secondary | ICD-10-CM

## 2016-03-08 DIAGNOSIS — J4541 Moderate persistent asthma with (acute) exacerbation: Secondary | ICD-10-CM

## 2016-03-08 DIAGNOSIS — I499 Cardiac arrhythmia, unspecified: Secondary | ICD-10-CM | POA: Diagnosis not present

## 2016-03-08 DIAGNOSIS — M170 Bilateral primary osteoarthritis of knee: Secondary | ICD-10-CM

## 2016-03-08 DIAGNOSIS — Z6841 Body Mass Index (BMI) 40.0 and over, adult: Secondary | ICD-10-CM | POA: Diagnosis not present

## 2016-03-08 LAB — COMPREHENSIVE METABOLIC PANEL
ALBUMIN: 4 g/dL (ref 3.5–5.2)
ALK PHOS: 66 U/L (ref 39–117)
ALT: 15 U/L (ref 0–35)
AST: 21 U/L (ref 0–37)
BILIRUBIN TOTAL: 0.7 mg/dL (ref 0.2–1.2)
BUN: 11 mg/dL (ref 6–23)
CO2: 28 mEq/L (ref 19–32)
Calcium: 9.4 mg/dL (ref 8.4–10.5)
Chloride: 103 mEq/L (ref 96–112)
Creatinine, Ser: 0.81 mg/dL (ref 0.40–1.20)
GFR: 92.41 mL/min (ref >60.00)
GLUCOSE: 86 mg/dL (ref 70–99)
POTASSIUM: 3.9 meq/L (ref 3.5–5.1)
Sodium: 140 mEq/L (ref 135–145)
TOTAL PROTEIN: 7.9 g/dL (ref 6.0–8.3)

## 2016-03-08 LAB — TSH: TSH: 1.79 u[IU]/mL (ref 0.35–4.50)

## 2016-03-08 MED ORDER — ALBUTEROL SULFATE HFA 108 (90 BASE) MCG/ACT IN AERS: 2.0000 | INHALATION_SPRAY | Freq: Four times a day (QID) | RESPIRATORY_TRACT | 0 refills | Status: DC | PRN

## 2016-03-08 MED ORDER — DICLOFENAC SODIUM 1 % TD GEL: 4.0000 g | Freq: Four times a day (QID) | TRANSDERMAL | 3 refills | Status: DC

## 2016-03-08 MED ORDER — IPRATROPIUM-ALBUTEROL 0.5-2.5 (3) MG/3ML IN SOLN
1.5000 mL | Freq: Once | RESPIRATORY_TRACT | Status: AC
  Administered 2016-03-08: 1.5 mL via RESPIRATORY_TRACT

## 2016-03-08 MED ORDER — BUDESONIDE-FORMOTEROL FUMARATE 80-4.5 MCG/ACT IN AERO: 2.0000 | INHALATION_SPRAY | Freq: Two times a day (BID) | RESPIRATORY_TRACT | 3 refills | Status: DC

## 2016-03-08 NOTE — Progress Notes (Signed)
HPI:   [name] is a 60 y.o. female, who is here today to establish care with me.  Former PCP: N/A  Last preventive routine visit: 30 years ago.   Concerns today: SOB.  She is c/o exertional dyspnea for a while now but worse since 01/2016. She usually can walk about 2.5 blocks before she needs to stop because dyspnea. She denies associated chest pain or diaphoresis. Frequently symptoms are associated with wheezing and cough.  Symptoms are alleviated by rest. She has Hx of asthma and allergic rhinitis. + Nasal congestion, rhinorrhea, nose and eyes itching. No odynophagia or dysphagia.  She is not aware of loud snore or sleep apnea. Denies fever, chills, or myalgias.  + Fatigue, sleeps well and usually feels rested next day. No Hx of tobacco use. She takes OTC Zyrtec 10 mg, which helps with symptoms.   She is reporting history of hypertension, she is currently on non-pharmacologic treatment. Reporting Hx of heart murmur and irregular HR.  Denies severe/frequent headache, visual changes, palpitation, claudication, focal weakness, or edema.  History of peptic ulcer disease, she stopped Omeprazole because it was causing nausea. She has been able to control symptoms through diet, still has mild epigastric discomfort about every 6 months, usually exacerbated by certain food. She never had a colonoscopy before.  Denies nausea, vomiting, changes in bowel habits, blood in stool or melena.   She lives alone.  She has not been consistent with a healthy diet and does not exercise regularly.  -Also c/o years of knee pain, worse since she started working due to long walks. She denies Hx of knee trauma. Pain is exacerbated by walking, going up and down stairs. Alleviated by rest. No edema or erythema. Mild limitation of ROM. She does not take OTC medication.    Review of Systems  Constitutional: Positive for fatigue. Negative for activity change, appetite  change, fever and unexpected weight change.  HENT: Positive for congestion, rhinorrhea and sneezing. Negative for facial swelling, mouth sores, nosebleeds, sore throat and trouble swallowing.   Eyes: Positive for itching. Negative for pain, redness and visual disturbance.  Respiratory: Positive for cough, shortness of breath and wheezing. Negative for apnea.   Cardiovascular: Negative for chest pain, palpitations and leg swelling.  Gastrointestinal: Negative for abdominal pain, nausea and vomiting.       Negative for changes in bowel habits.  Genitourinary: Negative for decreased urine volume, difficulty urinating, dysuria and hematuria.  Musculoskeletal: Positive for arthralgias (knees). Negative for joint swelling and myalgias.  Skin: Negative for color change and rash.  Allergic/Immunologic: Positive for environmental allergies.  Neurological: Negative for seizures, syncope, weakness, numbness and headaches.  Hematological: Negative for adenopathy. Does not bruise/bleed easily.  Psychiatric/Behavioral: Negative for confusion and sleep disturbance. The patient is not nervous/anxious.       Current Outpatient Prescriptions on File Prior to Visit  Medication Sig Dispense Refill  . cetirizine (ZYRTEC) 10 MG tablet Take 10 mg by mouth daily.     No current facility-administered medications on file prior to visit.      Past Medical History:  Diagnosis Date  . Allergy   . Asthma    as child  . Hypertension    Allergies  Allergen Reactions  . Glutethimides Shortness Of Breath  . Penicillins Hives, Shortness Of Breath, Itching and Rash  . Shrimp [Shellfish Allergy] Anaphylaxis  . Pork-Derived Products Other (See Comments)    Head ache     Family History  Problem Relation Age of Onset  . Hypertension Mother   . Heart disease Father     Social History   Social History  . Marital status: Legally Separated    Spouse name: N/A  . Number of children: N/A  . Years of  education: N/A   Social History Main Topics  . Smoking status: Never Smoker  . Smokeless tobacco: Never Used  . Alcohol use No  . Drug use: No  . Sexual activity: Not Currently   Other Topics Concern  . None   Social History Narrative  . None    Vitals:   03/08/16 0903  BP: 134/80  Pulse: 88  Resp: 16  Temp: 98.2 F (36.8 C)   O2 sat at RA 97%  Body mass index is 45.53 kg/m.    Physical Exam  Nursing note and vitals reviewed. Constitutional: She is oriented to person, place, and time. She appears well-developed. She does not appear ill. No distress.  HENT:  Head: Atraumatic.  Mouth/Throat: Oropharynx is clear and moist and mucous membranes are normal.  Eyes: Conjunctivae and EOM are normal. Pupils are equal, round, and reactive to light.  Neck: No JVD present. No tracheal deviation present. No thyroid mass and no thyromegaly present.  Cardiovascular: Normal rate.  An irregular rhythm present.  No murmur heard. Pulses:      Dorsalis pedis pulses are 2+ on the right side, and 2+ on the left side.  Respiratory: Effort normal. No respiratory distress. She has wheezes (diffuse, bilateral). She has no rhonchi. She has no rales.  GI: Soft. She exhibits no mass. There is no hepatomegaly. There is no tenderness.  Musculoskeletal: She exhibits edema (trace pitting LE edema, bilateral). She exhibits no tenderness.  Knee crepitus bilateral, L>R. Limitation of flexion, R>L. No edema or erythema.  Lymphadenopathy:    She has no cervical adenopathy.  Neurological: She is alert and oriented to person, place, and time. She has normal strength. No cranial nerve deficit. Coordination normal.  Stable gait, no assistance needed.  Skin: Skin is warm. No erythema.  Psychiatric: She has a normal mood and affect.  Well groomed, good eye contact.      ASSESSMENT AND PLAN:    Alejandra Vega was seen today for establish care.  Diagnoses and all orders for this visit:    Lab Results    Component Value Date   TSH 1.79 03/08/2016     Chemistry      Component Value Date/Time   NA 140 03/08/2016 1054   K 3.9 03/08/2016 1054   CL 103 03/08/2016 1054   CO2 28 03/08/2016 1054   BUN 11 03/08/2016 1054   CREATININE 0.81 03/08/2016 1054      Component Value Date/Time   CALCIUM 9.4 03/08/2016 1054   ALKPHOS 66 03/08/2016 1054   AST 21 03/08/2016 1054   ALT 15 03/08/2016 1054   BILITOT 0.7 03/08/2016 1054      Exertional dyspnea  Possible causes discussed. Seems related to asthma exacerbation.  Will hold on imaging for now. EKG done today, abnormal, no major changes when compare with prior EKG's.  Clearly instructed about warning signs.   -     Ambulatory referral to Cardiology  Moderate persistent asthma with acute exacerbation  After Duoneb breathing treatment lung auscultation with no rales or rhonchi and wheezing sporadic. I do not think oral steroids are needed at this time.  Continue Albuterol inh 2 puff qid for a week then as needed. Symbicort daily,  some side effects discussed. F/U in 3-4 weeks, before if needed.   -     ipratropium-albuterol (DUONEB) 0.5-2.5 (3) MG/3ML nebulizer solution 1.5 mL; Take 1.5 mLs by nebulization once. -     budesonide-formoterol (SYMBICORT) 80-4.5 MCG/ACT inhaler; Inhale 2 puffs into the lungs 2 (two) times daily. -     albuterol (PROVENTIL HFA;VENTOLIN HFA) 108 (90 Base) MCG/ACT inhaler; Inhale 2 puffs into the lungs every 6 (six) hours as needed for wheezing or shortness of breath.  Irregular heart rhythm  Reporting prior Hx. EKG SR, PAC's. Instructed about warning signs.   -     EKG 12-Lead -     Comprehensive metabolic panel  -     TSH  Bilateral primary osteoarthritis of knee  I do not recommend oral NSAID's but rather topical. Wt loss may help.  -     diclofenac sodium (VOLTAREN) 1 % GEL; Apply 4 g topically 4 (four) times daily.  BMI 45.0-49.9, adult ([place])  We discussed benefits of wt loss as  well as adverse effects of obesity. Consistency with healthy diet and physical activity recommended. Weight Watchers is a good option. For now she will hold on exercise until cardiology evaluation.   Nonspecific abnormal electrocardiogram (ECG) (EKG)  EKG SR, inverted T waves lateral leads, LAE. EKG in 12/2011 similar changes. She states that she has not had cardiology evaluation or stress test done in the past.  Instructed clearly about warning signs.  -     Ambulatory referral to Cardiology          Betty G. Martinique, MD  [facility]. [place] office.

## 2016-03-08 NOTE — Patient Instructions (Addendum)
A few things to remember from today's visit:   Irregular heart rhythm - Plan: EKG 12-Lead, Comprehensive metabolic panel, TSH  Moderate persistent asthma with acute exacerbation - Plan: ipratropium-albuterol (DUONEB) 0.5-2.5 (3) MG/3ML nebulizer solution 1.5 mL, budesonide-formoterol (SYMBICORT) 80-4.5 MCG/ACT inhaler, albuterol (PROVENTIL HFA;VENTOLIN HFA) 108 (90 Base) MCG/ACT inhaler  BMI 45.0-49.9, adult ([facility])  Exertional dyspnea - Plan: Ambulatory referral to Cardiology  Nonspecific abnormal electrocardiogram (ECG) (EKG) - Plan: Ambulatory referral to Cardiology  Albuterol 2 puffs every 6 hours for a week and then as needed. If Symbicort is not covered please let us know which one they would cover. For now hold on exercise until cardiology visit.  Weight Watchers is a good option for weight loss, small and frequent meals. Flu vaccine and pneumonia vaccine strongly recommended.  Please be sure medication list is accurate. If a new problem present, please set up appointment sooner than planned today.

## 2016-03-09 ENCOUNTER — Other Ambulatory Visit: Payer: Self-pay

## 2016-03-09 DIAGNOSIS — R0602 Shortness of breath: Secondary | ICD-10-CM

## 2016-04-14 ENCOUNTER — Telehealth: Payer: Self-pay

## 2016-04-14 ENCOUNTER — Ambulatory Visit (INDEPENDENT_AMBULATORY_CARE_PROVIDER_SITE_OTHER): Payer: 59 | Admitting: Family Medicine

## 2016-04-14 ENCOUNTER — Encounter: Payer: Self-pay | Admitting: Family Medicine

## 2016-04-14 VITALS — BP 170/100 | HR 86 | Temp 97.8°F | Resp 12 | Ht 63.0 in | Wt 255.5 lb

## 2016-04-14 DIAGNOSIS — J069 Acute upper respiratory infection, unspecified: Secondary | ICD-10-CM | POA: Diagnosis not present

## 2016-04-14 DIAGNOSIS — J45901 Unspecified asthma with (acute) exacerbation: Secondary | ICD-10-CM | POA: Diagnosis not present

## 2016-04-14 DIAGNOSIS — I10 Essential (primary) hypertension: Secondary | ICD-10-CM

## 2016-04-14 MED ORDER — BENZONATATE 100 MG PO CAPS: 200.0000 mg | ORAL_CAPSULE | Freq: Two times a day (BID) | ORAL | 0 refills | Status: AC | PRN

## 2016-04-14 MED ORDER — IPRATROPIUM-ALBUTEROL 0.5-2.5 (3) MG/3ML IN SOLN
3.0000 mL | Freq: Once | RESPIRATORY_TRACT | Status: AC
  Administered 2016-04-14: 3 mL via RESPIRATORY_TRACT

## 2016-04-14 MED ORDER — PREDNISONE 20 MG PO TABS: 40.0000 mg | ORAL_TABLET | Freq: Every day | ORAL | 0 refills | Status: AC

## 2016-04-14 MED ORDER — FLUTICASONE-SALMETEROL 250-50 MCG/DOSE IN AEPB: 1.0000 | INHALATION_SPRAY | Freq: Two times a day (BID) | RESPIRATORY_TRACT | 3 refills | Status: DC

## 2016-04-14 MED ORDER — AMLODIPINE BESYLATE 5 MG PO TABS
5.0000 mg | ORAL_TABLET | Freq: Every day | ORAL | 2 refills | Status: DC
Start: 2016-04-14 — End: 2016-07-27

## 2016-04-14 NOTE — Patient Instructions (Addendum)
  ACUTE VISIT:  A few things to remember from today's visit:   Moderate asthma with acute exacerbation, unspecified whether persistent - Plan: Fluticasone-Salmeterol (ADVAIR DISKUS) 250-50 MCG/DOSE AEPB, predniSONE (DELTASONE) 20 MG tablet  URI, acute - Plan: benzonatate (TESSALON) 100 MG capsule  Essential hypertension, benign - Plan: amLODipine (NORVASC) 5 MG tablet  Blood pressure goal for most people is less than 140/90.  Elevated blood pressure increases the risk of strokes, heart and kidney disease, and eye problems. Regular physical activity and a healthy diet (DASH diet) usually help. Low salt diet. Take medications as instructed. Caution with some over the counter medications as cold medications, dietary products (for weight loss), and Ibuprofen or Aleve (frequent use);all these medications could cause elevation of blood pressure.   No cold meds. Please have chest X ray done today.  PLAIN Mucinex may help.  Prednisone with food, 3-5 days.   Please be sure medication list is accurate. If a new problem present, please set up appointment sooner than planned today.

## 2016-04-14 NOTE — Progress Notes (Signed)
HPI:  ACUTE VISIT:  Chief Complaint  Patient presents with  . Cough    started 7-8 days ago, low grade fever. Water makes pt nauseous.   . Hypertension    bp started rising on Tuesday.     [name] is a 60 y.o. female, who is here today complaining of a week of respiratory symptoms.   3 days ago started with nasal congestion, rhinorrhea, and post nasal drainage.  "Low grade fever", reporting 96 F temp. "Little bit of cough", cannot bring sputum up.   She had some chills and body aches. + Dysphonia.  No Hx of recent travel. Sick contact: No No known insect bite.  +Hx of allergies: Asthma, last time she used her Albuterol inh last week.   Medication OTC for this problem: Tylenol, denies use of cold medications.   Symptoms otherwise stable.  -Last OV she was c/o SOB, felt greatly better after she used Albuterol inh x 7 days. Since she started URI symptoms she has had mild exertional dyspnea but not as bad as she had before. She has not noted much wheezing.  She is not using Symbicort because causing shaking sensation. I last see her 03/08/16, missed f/u appt and did not have CXR done.   Elevated BP  BP yesterday was elevated, health fair in her neighborhood: > 200/120, re-checked 190/120 and 182/118. According to patient, she was recommended to go to the ER but she refused. She is now monitoring BP at home.  She attributes elevated blood pressure to chronic pain.  She has history of hypertension, she used to be on pharmacologic treatment, she does not recall name of medication.  She denies any frequent/severe headache, visual changes, chest pain, palpitation, abdominal pain, nausea, vomiting, or edema.    Review of Systems  Constitutional: Positive for fatigue and fever. Negative for appetite change, chills and diaphoresis.  HENT: Positive for congestion, postnasal drip, rhinorrhea and voice change. Negative for ear discharge, ear pain,  nosebleeds, sore throat and trouble swallowing.   Eyes: Negative for photophobia, pain, discharge and redness.  Respiratory: Positive for cough and shortness of breath. Negative for chest tightness and wheezing.   Cardiovascular: Negative for chest pain, palpitations and leg swelling.  Gastrointestinal: Positive for nausea. Negative for abdominal pain, diarrhea and vomiting.  Genitourinary: Negative for decreased urine volume, difficulty urinating and hematuria.  Musculoskeletal: Positive for arthralgias and myalgias.  Skin: Negative for color change and rash.  Neurological: Negative for seizures, syncope, facial asymmetry, weakness and headaches.  Hematological: Negative for adenopathy. Does not bruise/bleed easily.  Psychiatric/Behavioral: Positive for sleep disturbance. Negative for confusion. The patient is nervous/anxious.       Current Outpatient Prescriptions on File Prior to Visit  Medication Sig Dispense Refill  . albuterol (PROVENTIL HFA;VENTOLIN HFA) 108 (90 Base) MCG/ACT inhaler Inhale 2 puffs into the lungs every 6 (six) hours as needed for wheezing or shortness of breath. 1 Inhaler 0  . cetirizine (ZYRTEC) 10 MG tablet Take 10 mg by mouth daily.    . diclofenac sodium (VOLTAREN) 1 % GEL Apply 4 g topically 4 (four) times daily. 5 Tube 3   No current facility-administered medications on file prior to visit.      Past Medical History:  Diagnosis Date  . Allergy   . Asthma    as child  . Hypertension    Allergies  Allergen Reactions  . Glutethimides Shortness Of Breath  . Penicillins Hives, Shortness Of Breath, Itching  and Rash  . Shrimp [Shellfish Allergy] Anaphylaxis  . Pork-Derived Products Other (See Comments)    Head ache     Social History   Social History  . Marital status: Legally Separated    Spouse name: N/A  . Number of children: N/A  . Years of education: N/A   Social History Main Topics  . Smoking status: Never Smoker  . Smokeless tobacco:  Never Used  . Alcohol use No  . Drug use: No  . Sexual activity: Not Currently   Other Topics Concern  . None   Social History Narrative  . None    Vitals:   04/14/16 1006  BP: (!) 170/100  Pulse: 86  Resp: 12  Temp: 97.8 F (36.6 C)   O2 sat at RA 96%   Body mass index is 45.26 kg/m.   Physical Exam  Nursing note and vitals reviewed. Constitutional: She is oriented to person, place, and time. She appears well-developed. She does not appear ill. No distress.  HENT:  Head: Atraumatic.  Right Ear: Tympanic membrane, external ear and ear canal normal.  Left Ear: Tympanic membrane, external ear and ear canal normal.  Nose: Rhinorrhea present. Right sinus exhibits no maxillary sinus tenderness and no frontal sinus tenderness. Left sinus exhibits no maxillary sinus tenderness and no frontal sinus tenderness.  Mouth/Throat: Oropharynx is clear and moist and mucous membranes are normal.  Post nasal drainage. Moderate dysphonia.  Eyes: Conjunctivae and EOM are normal.  Neck: No edema and no erythema present.  Cardiovascular: Normal rate and regular rhythm.   No murmur heard. Respiratory: Effort normal. No stridor. No respiratory distress. She has wheezes. She has no rales.  Musculoskeletal: She exhibits no edema.  Lymphadenopathy:       Head (right side): No submandibular adenopathy present.       Head (left side): No submandibular adenopathy present.    She has no cervical adenopathy.  Neurological: She is alert and oriented to person, place, and time. She has normal strength. No cranial nerve deficit. Coordination and gait normal.  Skin: Skin is warm. No rash noted. No erythema.  Psychiatric: She has a normal mood and affect. Her speech is normal.  Well groomed, good eye contact.      ASSESSMENT AND PLAN:     Alejandra Vega was seen today for cough and hypertension.  Diagnoses and all orders for this visit:    Moderate asthma with acute exacerbation, unspecified  whether persistent  Here in the office after verbal consent and discussion of some side effects she received DuoNeb treatment, still wheezing but improved, no rales appreciated. She agrees with trying Advair twice daily and prednisone for 3-5 days. Albuterol 2 puffs every 4-6 hours for a week and then as needed. We discussed some side effects of medications. Further recommendations would be given after report of chest x-ray is received, strongly recommended to have it done today. Clearly instructed about warning signs. Follow-up in 2 weeks, before if needed.  -     Fluticasone-Salmeterol (ADVAIR DISKUS) 250-50 MCG/DOSE AEPB; Inhale 1 puff into the lungs 2 (two) times daily. -     predniSONE (DELTASONE) 20 MG tablet; Take 2 tablets (40 mg total) by mouth daily with breakfast.  URI, acute  Most likely vital. On examination today I do not appreciate signs of bacterial infection, so for now antibiotic was not recommended. Given elevated BP I do not recommend OTC cold medications/decongestants. Plenty of by mouth fluids. Plain Mucinex might help.  -  benzonatate (TESSALON) 100 MG capsule; Take 2 capsules (200 mg total) by mouth 2 (two) times daily as needed for cough. -     ipratropium-albuterol (DUONEB) 0.5-2.5 (3) MG/3ML nebulizer solution 3 mL; Take 3 mLs by nebulization once.  Essential hypertension, benign  Not well controlled. Possible complications of elevated BP discussed. Amlodipine 5 mg recommended. Monitor BP at home. Clearly instructed about warning signs. F/U in 2 weeks.  -     amLODipine (NORVASC) 5 MG tablet; Take 1 tablet (5 mg total) by mouth daily.    Return in about 2 weeks (around 04/28/2016) for HTN,asthma.     -[name] was advised to return or notify a doctor immediately if symptoms worsen or  new concerns arise, she voices understanding.       Loistine Eberlin G. Martinique, MD  [facility]. [place] office.

## 2016-04-14 NOTE — Telephone Encounter (Signed)
Pt presented to office with c/o increased blood pressure, cough and ShOB for several days. Pt states that BP has been 190/120 yesterday. Pt does not take medications for HTN. Pt triaged and scheduled to see Dr. Martinique @ 10am today.

## 2016-04-14 NOTE — Progress Notes (Signed)
Pre visit review using our clinic review tool, if applicable. No additional management support is needed unless otherwise documented below in the visit note. 

## 2016-04-28 ENCOUNTER — Ambulatory Visit (INDEPENDENT_AMBULATORY_CARE_PROVIDER_SITE_OTHER)
Admission: RE | Admit: 2016-04-28 | Discharge: 2016-04-28 | Disposition: A | Discharge status: Home / Self Care | Payer: 59 | Source: Ambulatory Visit | Attending: Family Medicine | Admitting: Family Medicine

## 2016-04-28 ENCOUNTER — Ambulatory Visit (INDEPENDENT_AMBULATORY_CARE_PROVIDER_SITE_OTHER): Payer: 59 | Admitting: Family Medicine

## 2016-04-28 ENCOUNTER — Encounter: Payer: Self-pay | Admitting: Family Medicine

## 2016-04-28 VITALS — BP 132/80 | HR 92 | Resp 12 | Ht 63.0 in | Wt 257.1 lb

## 2016-04-28 DIAGNOSIS — J4540 Moderate persistent asthma, uncomplicated: Secondary | ICD-10-CM | POA: Diagnosis not present

## 2016-04-28 DIAGNOSIS — R0609 Other forms of dyspnea: Secondary | ICD-10-CM | POA: Diagnosis not present

## 2016-04-28 DIAGNOSIS — R0602 Shortness of breath: Secondary | ICD-10-CM

## 2016-04-28 MED ORDER — AEROCHAMBER PLUS MISC: 2 refills | Status: AC

## 2016-04-28 NOTE — Progress Notes (Signed)
Pre visit review using our clinic review tool, if applicable. No additional management support is needed unless otherwise documented below in the visit note. 

## 2016-04-28 NOTE — Progress Notes (Signed)
HPI:   Alejandra Vega is a 60 y.o. female, who is here today to follow on asthma exacerbation, 04/14/16.  She was having acute URI like symptoms + wosening dyspnea and on examination diffuse wheezing.  She has Hx of asthma, she was on Symbicort but reporting "jittery" sensation, so change to Advair. Albuterol qid and Prednisone recommended.   She is not using Advair, she still has Symbicort instead, which stopped using because was making cough worse.  She has been resting, sleeping most of the time and she feels like this really helps. Also drnking fluids and applying Vick vapor rub on chest. She did not use Albuterol inh as instructed because she was sleeping most of the time. Last Albuterol inh dose yesterday. She did not take Prednisone.   She is breathing "much better", still exertional dyspnea but back to her baseline.  Reporting "low grade fever", 98.1 F, uses frontal strip.  I saw her initially 03/08/16, then she was c/o 1-2 months of exertional dyspnea, associated wheezing and cough. She denies chest pain or diaphoresis. EKG was done, abnormal but no significant changes from a prior one in 12/2011.     Last OV her BP was also elevated. She started Amlodipine 5 mg daily. She is not checking BP at home.  She does not exercise regularly, she has an active job. She has not been consistent with diet.   Review of Systems  Constitutional: Positive for fatigue. Negative for chills and diaphoresis.  HENT: Negative for facial swelling, mouth sores, nosebleeds and trouble swallowing.   Respiratory: Positive for cough and shortness of breath. Negative for wheezing.   Cardiovascular: Negative for chest pain and palpitations.  Gastrointestinal: Negative for abdominal pain, nausea and vomiting.  Neurological: Negative for syncope, weakness and headaches.  Psychiatric/Behavioral: Negative for confusion. The patient is nervous/anxious.       Current Outpatient  Prescriptions on File Prior to Visit  Medication Sig Dispense Refill  . albuterol (PROVENTIL HFA;VENTOLIN HFA) 108 (90 Base) MCG/ACT inhaler Inhale 2 puffs into the lungs every 6 (six) hours as needed for wheezing or shortness of breath. 1 Inhaler 0  . amLODipine (NORVASC) 5 MG tablet Take 1 tablet (5 mg total) by mouth daily. 30 tablet 2  . cetirizine (ZYRTEC) 10 MG tablet Take 10 mg by mouth daily.    . diclofenac sodium (VOLTAREN) 1 % GEL Apply 4 g topically 4 (four) times daily. 5 Tube 3  . Fluticasone-Salmeterol (ADVAIR DISKUS) 250-50 MCG/DOSE AEPB Inhale 1 puff into the lungs 2 (two) times daily. 1 each 3   No current facility-administered medications on file prior to visit.      Past Medical History:  Diagnosis Date  . Allergy   . Asthma    as child  . Hypertension    Allergies  Allergen Reactions  . Glutethimides Shortness Of Breath  . Penicillins Hives, Shortness Of Breath, Itching and Rash  . Shrimp [Shellfish Allergy] Anaphylaxis  . Pork-Derived Products Other (See Comments)    Head ache     Social History   Social History  . Marital status: Legally Separated    Spouse name: N/A  . Number of children: N/A  . Years of education: N/A   Social History Main Topics  . Smoking status: Never Smoker  . Smokeless tobacco: Never Used  . Alcohol use No  . Drug use: No  . Sexual activity: Not Currently   Other Topics Concern  . None  Social History Narrative  . None    Vitals:   04/28/16 1035  BP: 132/80  Pulse: 92  Resp: 12    O2 sat at RA 95%  Body mass index is 45.55 kg/m.    Physical Exam  Nursing note and vitals reviewed. Constitutional: She is oriented to person, place, and time. She appears well-developed. No distress.  HENT:  Head: Atraumatic.  Mouth/Throat: Oropharynx is clear and moist and mucous membranes are normal.  Eyes: Conjunctivae and EOM are normal.  Cardiovascular: Normal rate.  An irregular rhythm present.  No murmur  heard. Pulses:      Dorsalis pedis pulses are 2+ on the right side, and 2+ on the left side.  Respiratory: Effort normal and breath sounds normal. No respiratory distress.  GI: Soft. She exhibits no mass. There is no hepatomegaly. There is no tenderness.  Musculoskeletal: She exhibits edema (1+ pitting LE edema ,bilateral.).  Neurological: She is alert and oriented to person, place, and time. She has normal strength. Coordination normal.  Skin: Skin is warm. No erythema.  Psychiatric: She has a normal mood and affect.  Well groomed, good eye contact.      ASSESSMENT AND PLAN:     Tresa was seen today for follow-up.  Diagnoses and all orders for this visit:  Exertional dyspnea  Improved but not resolved. We reviewed possible causes again: asthma, deconditioning, among some but cardiac etiology also needs to be considered. Given her abnormal EKG and no prior cardiology work-up cardiology referral will be arranged. Instructed about warning signs. For now hold on regular exercise.   -     Ambulatory referral to Cardiology  Moderate persistent asthma, unspecified whether complicated  Acute exacerbation resolved. Not well controlled, not using inhalers as instructed. She wants to continue Symbicot for now, spacer may help to better deliver medication and therefore may help with cough. Wt loss also will help. Albuterol inh qid as needed. F/U in 3 months.   Other orders -     Spacer/Aero-Holding Chambers (AEROCHAMBER PLUS) inhaler; Use as instructed      -Alejandra Vega was advised to return sooner than planned today if new concerns arise.       Ahmeer Tuman G. Martinique, MD  [facility]. [place] office.

## 2016-04-28 NOTE — Patient Instructions (Addendum)
A few things to remember from today's visit:   Exertional dyspnea - Plan: Ambulatory referral to Cardiology  Moderate persistent asthma, unspecified whether complicated  BMI [id number], adult ([place])  Cough can last a few more days. Hold on exercise for now until cardiology appointment. Decreased meals potions, try to eat healthier.

## 2016-07-26 NOTE — Progress Notes (Signed)
      HPI:   [name] is a 61 y.o. female, who is here today to follow on some chronic medical problems.   Last seen on 04/28/17, then she was referred to cardiologists because exertional dyspnea and abnormal EKG. She was also referred in 02/2016,   Asthma: Currently she is on Symbicort 80-4.5 mcg bid and Albuterol inh as needed.   HTN: She is on Norvasc 5 mg daily. Denies severe/frequent headache, visual changes, chest pain, dyspnea, palpitation, claudication, focal weakness, or edema.  Lab Results  Component Value Date   CREATININE 0.81 03/08/2016   BUN 11 03/08/2016   NA 140 03/08/2016   K 3.9 03/08/2016   CL 103 03/08/2016   CO2 28 03/08/2016      Review of Systems    Current Outpatient Prescriptions on File Prior to Visit  Medication Sig Dispense Refill  . albuterol (PROVENTIL HFA;VENTOLIN HFA) 108 (90 Base) MCG/ACT inhaler Inhale 2 puffs into the lungs every 6 (six) hours as needed for wheezing or shortness of breath. 1 Inhaler 0  . amLODipine (NORVASC) 5 MG tablet Take 1 tablet (5 mg total) by mouth daily. 30 tablet 2  . cetirizine (ZYRTEC) 10 MG tablet Take 10 mg by mouth daily.    . diclofenac sodium (VOLTAREN) 1 % GEL Apply 4 g topically 4 (four) times daily. 5 Tube 3  . Fluticasone-Salmeterol (ADVAIR DISKUS) 250-50 MCG/DOSE AEPB Inhale 1 puff into the lungs 2 (two) times daily. (Patient not taking: Reported on 04/29/2016) 1 each 3  . Spacer/Aero-Holding Chambers (AEROCHAMBER PLUS) inhaler Use as instructed 1 each 2   No current facility-administered medications on file prior to visit.      Past Medical History:  Diagnosis Date  . Allergy   . Asthma    as child  . Hypertension    Allergies  Allergen Reactions  . Glutethimides Shortness Of Breath  . Penicillins Hives, Shortness Of Breath, Itching and Rash  . Shrimp [Shellfish Allergy] Anaphylaxis  . Pork-Derived Products Other (See Comments)    Head ache     Social History   Social  History  . Marital status: Legally Separated    Spouse name: N/A  . Number of children: N/A  . Years of education: N/A   Social History Main Topics  . Smoking status: Never Smoker  . Smokeless tobacco: Never Used  . Alcohol use No  . Drug use: No  . Sexual activity: Not Currently   Other Topics Concern  . Not on file   Social History Narrative  . No narrative on file    There were no vitals filed for this visit. There is no height or weight on file to calculate BMI.      Physical Exam    ASSESSMENT AND PLAN:     There are no diagnoses linked to this encounter.         -[name] was advised to return sooner than planned today if new concerns arise.       Alejandra Vega G. Martinique, MD  [facility]. [place] office.

## 2016-07-27 ENCOUNTER — Encounter: Payer: Self-pay | Admitting: Family Medicine

## 2016-07-27 ENCOUNTER — Encounter: Payer: 59 | Admitting: Family Medicine

## 2016-07-27 ENCOUNTER — Ambulatory Visit (INDEPENDENT_AMBULATORY_CARE_PROVIDER_SITE_OTHER): Payer: 59 | Admitting: Family Medicine

## 2016-07-27 VITALS — Ht 63.0 in

## 2016-07-27 VITALS — BP 130/80 | HR 93 | Resp 12 | Ht 63.0 in | Wt 249.1 lb

## 2016-07-27 DIAGNOSIS — J4540 Moderate persistent asthma, uncomplicated: Secondary | ICD-10-CM

## 2016-07-27 DIAGNOSIS — Z6841 Body Mass Index (BMI) 40.0 and over, adult: Secondary | ICD-10-CM

## 2016-07-27 DIAGNOSIS — M170 Bilateral primary osteoarthritis of knee: Secondary | ICD-10-CM | POA: Diagnosis not present

## 2016-07-27 DIAGNOSIS — J309 Allergic rhinitis, unspecified: Secondary | ICD-10-CM

## 2016-07-27 DIAGNOSIS — I10 Essential (primary) hypertension: Secondary | ICD-10-CM

## 2016-07-27 MED ORDER — MONTELUKAST SODIUM 10 MG PO TABS: 10.0000 mg | ORAL_TABLET | Freq: Every day | ORAL | 3 refills | Status: DC

## 2016-07-27 MED ORDER — BECLOMETHASONE DIPROPIONATE 80 MCG/ACT IN AERS: 2.0000 | INHALATION_SPRAY | Freq: Two times a day (BID) | RESPIRATORY_TRACT | 4 refills | Status: DC

## 2016-07-27 NOTE — Patient Instructions (Addendum)
A few things to remember from today's visit:   BMI 40.0-44.9, adult ([place])  Essential hypertension, benign  Moderate persistent asthma, unspecified whether complicated - Plan: montelukast (SINGULAIR) 10 MG tablet, beclomethasone (QVAR) 80 MCG/ACT inhaler  Bilateral primary osteoarthritis of knee  Allergic rhinitis, unspecified chronicity, unspecified seasonality, unspecified trigger - Plan: montelukast (SINGULAIR) 10 MG tablet  Topical Icy hot with Lidocaine.   Ask pharmacist:  Ruthe Mannan or [facility] or Advair   Please be sure medication list is accurate. If a new problem present, please set up appointment sooner than planned today.

## 2016-07-27 NOTE — Progress Notes (Signed)
Pre visit review using our clinic review tool, if applicable. No additional management support is needed unless otherwise documented below in the visit note. 

## 2016-07-27 NOTE — Progress Notes (Signed)
HPI:   Alejandra Vega is a 61 y.o. female, who is here today to follow on some chronic medical problems.   Last seen on 04/28/17, then she was referred to cardiologists due to persistent exertional dyspnea and abnormal EKG. She did not keep appt. She was also referred in 02/2016, did not keep appt.   Asthma: Currently she is supposed on Symbicort 80-4.5 mcg bid but she is not using consistently, once per week.She feels like medication makes her sick: Itchy throat and scratchy throat.  She is using a spacer.  Albuterol inh as needed, 2-3 times per week, nocturnal symptoms. Cold weather also exacerbate symptoms. In general exertional dyspnea and wheezing have improved. Denies cough.  -She tells me that she feels like food may aggravate some foods, so she decreased bread intake, sugar,and sodas. Increase fruit intake.She is now wearing size 18, she was size 20.She is not sure about how much wt she has lost so far. She does not exercise regularly due to knee pain but trying to be more active and walk briskly.    HTN: She is not taking Norvasc 5 mg, it causes swelling. BP "high" sometimes, attributes it to stress.  Denies severe/frequent headache, visual changes, chest pain, palpitation, claudication, focal weakness, or edema.  Lab Results  Component Value Date   CREATININE 0.81 03/08/2016   BUN 11 03/08/2016   NA 140 03/08/2016   K 3.9 03/08/2016   CL 103 03/08/2016   CO2 28 03/08/2016     Concerns today:   Sleep: Waking up frequently during the night, q 3 hours, she does not feel rested. Sleeps seems better when she increases fluid intake during the day.  Last night she had some numbness on right hand but improved with movement, no prior Hx. No weakness.   -Knee pain: Medial knee pain, L>R. Exacerbated with prolonged walking and palpation.  + "Popping" with certain movements and does not always causes pain. No recent trauma. This is a chronic  problem.  She did not fill Voltaren gel because cost.  Tylenol at bedtime helps some as well as topical menthol.  -Itchy eyes,nasal congestion, and rhinorrhea. She has not had fever,chills,or sore throat. Hx of allergic rhinitis. No sick contact.   Review of Systems  Constitutional: Positive for fatigue. Negative for chills and diaphoresis.  HENT: Positive for congestion, rhinorrhea and sneezing. Negative for mouth sores, nosebleeds, sore throat and trouble swallowing.   Eyes: Positive for itching. Negative for redness and visual disturbance.  Respiratory: Positive for shortness of breath and wheezing. Negative for cough.   Cardiovascular: Negative for chest pain and palpitations.  Gastrointestinal: Negative for abdominal pain, nausea and vomiting.       No changes in bowel movements.  Genitourinary: Negative for decreased urine volume and hematuria.  Musculoskeletal: Positive for arthralgias. Negative for gait problem, joint swelling and myalgias.  Skin: Negative for rash.  Allergic/Immunologic: Positive for environmental allergies.  Neurological: Negative for syncope, weakness and headaches.  Psychiatric/Behavioral: Positive for sleep disturbance. Negative for confusion. The patient is not nervous/anxious.       Current Outpatient Prescriptions on File Prior to Visit  Medication Sig Dispense Refill  . albuterol (PROVENTIL HFA;VENTOLIN HFA) 108 (90 Base) MCG/ACT inhaler Inhale 2 puffs into the lungs every 6 (six) hours as needed for wheezing or shortness of breath. 1 Inhaler 0  . cetirizine (ZYRTEC) 10 MG tablet Take 10 mg by mouth daily.    . Fluticasone-Salmeterol (ADVAIR DISKUS) 250-50  MCG/DOSE AEPB Inhale 1 puff into the lungs 2 (two) times daily. 1 each 3  . Spacer/Aero-Holding Chambers (AEROCHAMBER PLUS) inhaler Use as instructed 1 each 2   No current facility-administered medications on file prior to visit.      Past Medical History:  Diagnosis Date  . Allergy   .  Asthma    as child  . Hypertension    Allergies  Allergen Reactions  . Glutethimides Shortness Of Breath  . Penicillins Hives, Shortness Of Breath, Itching and Rash  . Shrimp [Shellfish Allergy] Anaphylaxis  . Pork-Derived Products Other (See Comments)    Head ache     Social History   Social History  . Marital status: Legally Separated    Spouse name: N/A  . Number of children: N/A  . Years of education: N/A   Social History Main Topics  . Smoking status: Never Smoker  . Smokeless tobacco: Never Used  . Alcohol use No  . Drug use: No  . Sexual activity: Not Currently   Other Topics Concern  . None   Social History Narrative  . None    Vitals:   07/27/16 0927  BP: 130/80  Pulse: 93  Resp: 12  O2 sat 94% at RA  Body mass index is 44.13 kg/m.  Wt Readings from Last 3 Encounters:  07/27/16 249 lb 2 oz (113 kg)  04/28/16 257 lb 2 oz (116.6 kg)  04/14/16 255 lb 8 oz (115.9 kg)     Physical Exam  Nursing note and vitals reviewed. Constitutional: She is oriented to person, place, and time. She appears well-developed. No distress.  HENT:  Head: Atraumatic.  Nose: Right sinus exhibits no maxillary sinus tenderness and no frontal sinus tenderness. Left sinus exhibits no maxillary sinus tenderness and no frontal sinus tenderness.  Mouth/Throat: Oropharynx is clear and moist and mucous membranes are normal.  Eyes: Conjunctivae and EOM are normal.  Cardiovascular: Normal rate and regular rhythm.   No murmur heard. Pulses:      Dorsalis pedis pulses are 2+ on the right side, and 2+ on the left side.  Respiratory: Effort normal and breath sounds normal. No respiratory distress.  GI: Soft. She exhibits no mass. There is no hepatomegaly. There is no tenderness.  Musculoskeletal: She exhibits edema (Trace pitting LE edema ,bilateral.).  Knee with limitation flexion, L>R. Right knee pain elicited and crepitus. Pain upon palpation on medial joint line medial,left  knee. Anterior and posterior drawer test negative. Mild laxity noted LCL bilateral.  Neurological: She is alert and oriented to person, place, and time. She has normal strength. Coordination and gait normal.  Skin: Skin is warm. No erythema.  Psychiatric: Her mood appears anxious.  Well groomed, good eye contact.      ASSESSMENT AND PLAN:    Russell was seen today for follow-up.  Diagnoses and all orders for this visit:  BMI 40.0-44.9, adult ([place])  Lost about 7 Lb since her last OV. We discussed benefits of wt loss as well as adverse effects of obesity. Consistency with healthy diet and physical activity recommended.   Essential hypertension, benign  Adequately controlled. Continue non pharmacologic treatment for now.  DASH low salt diet recommended. Eye exam recommended annually. F/U in 2-3 months, before if needed.  Moderate persistent asthma, unspecified whether complicated  Not well controlled,compliance is an issue.  Stop Symbicort. QVAR and Singulair added today. Albuterol to continue as needed.       Refused Pneumovax.  -  montelukast (SINGULAIR) 10 MG tablet; Take 1 tablet (10 mg total) by mouth at bedtime. -     beclomethasone (QVAR) 80 MCG/ACT inhaler; Inhale 2 puffs into the lungs 2 (two) times daily.  Bilateral primary osteoarthritis of knee  For now continue topical OTC medications: Icy hot with Lidocaine. Tylenol 650 mg tid as needed. May consider Cymbalta.  Allergic rhinitis, unspecified chronicity, unspecified seasonality, unspecified trigger  OTC antihistaminic like Claritin or Zyrtec. Nasal saline daily as needed. OTC intranasal steroid spray daily   -     montelukast (SINGULAIR) 10 MG tablet; Take 1 tablet (10 mg total) by mouth at bedtime.      -Alejandra Vega was advised to return sooner than planned today if new concerns arise.       Betty G. Martinique, MD  [facility]. [place]  office.

## 2016-09-25 NOTE — Progress Notes (Signed)
HPI:   [name] is a 61 y.o. female, who is here today to follow on recent OV.   She was seen on 07/27/16.  HTN: Last OV she had already stopped antihypertensive medication, so we agreed on trying continuing non pharmacologic treatment. She was on Amlodipine 5 mg, which she discontinued because caused LE edema. BP's at home: 140's/80-90, she checks BP "random." Following low salt diet: Not consistently but has tried not to add salt to her food.  + Stress. Denies visual changes, chest pain, worsening dyspnea, palpitation, claudication, focal weakness, or edema.  Obesity:  Dietary changes since her last OV: Drinking more water, still eating season fries from [place] but not as frequent as she did before, 2-3 times per week instead daily. Decreased candy intake and increased vegetables. Exercising:Doing some "stretching" for her knees.     Concerns today:   Allergies: Nasal congestion and rhinorrhea. Hx of asthma and shellfish allergies.She does not have an EpiPen. She denies fever or chills. No sick contact. Fatigue worse for 2-3 weeks.   Albuterol inh as needed, she states that just once since her last OV. She is taking Benadryl 25 mg at night, states that it helps with wheezing.  She is not using QVAR, which was recommended last OV. She still has Advair 250-50 mcg and using it at night ,still "hurts" her throat.Advair was recommended to discontinued because side effects, throat irritation.  She is not taking Cetirizine and she does not take Singulair 10 mg daily. Symptoms: Dyspnea,cough,and wheezing seem to be aggravated by flower and plants at work.  "Mole" changes:  She has this mole on left forearm for about 10 years. She thinks she was bitten by a insect on area because noted a "big bump" right beside mole about 1-2 months. She applied abx oint and covered it. + Pruritus. She has also applied alcohol. Last week she had "some" pain  on forearm  where lesion is and mild erythema. Denies joint edema or erythema. Denies easy bleeding or increasing in size.   Review of Systems  Constitutional: Positive for fatigue. Negative for activity change, appetite change, chills, fever and unexpected weight change.  HENT: Positive for congestion, postnasal drip, rhinorrhea, sinus pressure and sneezing. Negative for ear pain, facial swelling, mouth sores, nosebleeds, sore throat and trouble swallowing.   Eyes: Positive for itching. Negative for redness and visual disturbance.  Respiratory: Positive for cough, shortness of breath and wheezing.   Cardiovascular: Negative for chest pain, palpitations and leg swelling.  Gastrointestinal: Negative for abdominal pain, nausea and vomiting.       Negative for changes in bowel habits.  Genitourinary: Negative for decreased urine volume, difficulty urinating, dysuria and hematuria.  Musculoskeletal: Positive for arthralgias. Negative for gait problem.  Skin: Positive for rash. Negative for wound.  Allergic/Immunologic: Positive for environmental allergies.  Neurological: Positive for headaches (frontal pressure,mild). Negative for syncope, weakness and numbness.  Hematological: Negative for adenopathy. Does not bruise/bleed easily.  Psychiatric/Behavioral: Negative for confusion. The patient is nervous/anxious.       Current Outpatient Prescriptions on File Prior to Visit  Medication Sig Dispense Refill  . albuterol (PROVENTIL HFA;VENTOLIN HFA) 108 (90 Base) MCG/ACT inhaler Inhale 2 puffs into the lungs every 6 (six) hours as needed for wheezing or shortness of breath. 1 Inhaler 0  . beclomethasone (QVAR) 80 MCG/ACT inhaler Inhale 2 puffs into the lungs 2 (two) times daily. 1 Inhaler 4  . cetirizine (ZYRTEC) 10 MG tablet  Take 10 mg by mouth daily.    . montelukast (SINGULAIR) 10 MG tablet Take 1 tablet (10 mg total) by mouth at bedtime. 30 tablet 3  . Spacer/Aero-Holding Chambers (AEROCHAMBER PLUS)  inhaler Use as instructed 1 each 2   No current facility-administered medications on file prior to visit.      Past Medical History:  Diagnosis Date  . Allergy   . Asthma    as child  . Hypertension    Allergies  Allergen Reactions  . Glutethimides Shortness Of Breath  . Penicillins Hives, Shortness Of Breath, Itching and Rash  . Shrimp [Shellfish Allergy] Anaphylaxis  . Pork-Derived Products Other (See Comments)    Head ache     Social History   Social History  . Marital status: Legally Separated    Spouse name: N/A  . Number of children: N/A  . Years of education: N/A   Social History Main Topics  . Smoking status: Never Smoker  . Smokeless tobacco: Never Used  . Alcohol use No  . Drug use: No  . Sexual activity: Not Currently   Other Topics Concern  . None   Social History Narrative  . None    Vitals:   09/26/16 0929  BP: 130/84  Pulse: 88  Resp: 12  O2 sat at RA 96% Body mass index is 43.44 kg/m.  Wt Readings from Last 3 Encounters:  09/26/16 245 lb 4 oz (111.2 kg)  07/27/16 249 lb 2 oz (113 kg)  04/28/16 257 lb 2 oz (116.6 kg)    Physical Exam  Nursing note and vitals reviewed. Constitutional: She is oriented to person, place, and time. She appears well-developed. No distress.  HENT:  Head: Atraumatic.  Nose: Right sinus exhibits no maxillary sinus tenderness and no frontal sinus tenderness. Left sinus exhibits no maxillary sinus tenderness and no frontal sinus tenderness.  Mouth/Throat: Oropharynx is clear and moist and mucous membranes are normal.  Hypertrophic turbinates.  Eyes: Conjunctivae and EOM are normal. Pupils are equal, round, and reactive to light.  Cardiovascular: Normal rate and regular rhythm.   No murmur heard. Pulses:      Dorsalis pedis pulses are 2+ on the right side, and 2+ on the left side.  Respiratory: Effort normal and breath sounds normal. No respiratory distress.  GI: Soft. She exhibits no mass. There is no  hepatomegaly. There is no tenderness.  Musculoskeletal: She exhibits no edema or tenderness.  Lymphadenopathy:    She has no cervical adenopathy.  Neurological: She is alert and oriented to person, place, and time. She has normal strength. Gait normal.  Skin: Skin is warm. Rash noted. No ecchymosis noted. Rash is papular.     Right forearm,volar mid aspect there is a 3-4 mm, raised , rounded lesion, not tender, minimally erythematous, not indurated.  Psychiatric: She has a normal mood and affect.  Well groomed, good eye contact.      ASSESSMENT AND PLAN:   Alejandra Vega was seen today for follow-up.  Diagnoses and all orders for this visit:  Moderate persistent asthma, unspecified whether complicated  Not well controlled. Today I do not appreciate wheezing but I noted mild elevation on RR for a few seconds when trying to get on exam table. Exertional dyspnea most likely associated to asthma and allergies, it seems to happen just at work. She was referred to cardiologists x 2, missed appts and has not re-scheduled. She is not taking Singulair as instructed, has not started QVAR, and continue Advair but  not as initially recommended.  Encouraged to fill QVAR, if not covered this can be changed to Flovent or the one her insurance will cover.  Essential hypertension, benign  Adequately BP here in office but reporting some BP's > 140/90. Continue non pharmacologic treatment, educated about complications of poorly controlled HTN. DASH-low salt diet consistency recommended. Eye exam is due. F/U in 4 months, before if needed.  BMI 45.0-49.9, adult [facility])  She has lost about 4 lb since 07/2016. We discussed benefits of wt loss as well as adverse effects of obesity. Consistency with healthy diet and physical activity recommended. Daily brisk walking for 15-30 min as tolerated.  Allergic rhinitis, unspecified seasonality, unspecified trigger  Cetirizine 10 mg daily and Singulair 10 mg at  night. I do not recommend continuing Benadryl,some side effects discussed. Nasal irrigations as needed. OTC nasal intranasal steroid may also help.  Shellfish allergy -     EPINEPHrine 0.3 mg/0.3 mL IJ SOAJ injection; Inject 0.3 mLs (0.3 mg total) into the muscle as directed. As needed for anaphylaxis  Skin lesion of left arm  Lesion does not look suspicious, ? Insect bite. Topical steroid,small amount bid for 14 days recommended. Monitor for changes, in which case Bx may be necessary.  -     triamcinolone cream (KENALOG) 0.1 %; Apply 1 application topically 2 (two) times daily.  Non compliance with medical treatment   Educated about importance of compliance with treatment recommendations as well as appt. In general no new symptoms reported since her first visit, dyspnea has improved some as well as wheezing, although still symptomatic. She is not taking medications as instructed. We reviewed medication doses, side effects, indications as well as possible complications of chronic medical problems.  Explained next OV will be for routine physical, which we have postponed waiting for her chronic medical problems to be more stable.     Betty G. Martinique, MD  [facility]. [place] office.

## 2016-09-26 ENCOUNTER — Encounter: Payer: Self-pay | Admitting: Family Medicine

## 2016-09-26 ENCOUNTER — Ambulatory Visit (INDEPENDENT_AMBULATORY_CARE_PROVIDER_SITE_OTHER): Payer: 59 | Admitting: Family Medicine

## 2016-09-26 VITALS — BP 130/84 | HR 88 | Resp 12 | Ht 63.0 in | Wt 245.2 lb

## 2016-09-26 DIAGNOSIS — J309 Allergic rhinitis, unspecified: Secondary | ICD-10-CM | POA: Diagnosis not present

## 2016-09-26 DIAGNOSIS — Z6841 Body Mass Index (BMI) 40.0 and over, adult: Secondary | ICD-10-CM | POA: Diagnosis not present

## 2016-09-26 DIAGNOSIS — L989 Disorder of the skin and subcutaneous tissue, unspecified: Secondary | ICD-10-CM

## 2016-09-26 DIAGNOSIS — I10 Essential (primary) hypertension: Secondary | ICD-10-CM

## 2016-09-26 DIAGNOSIS — Z91013 Allergy to seafood: Secondary | ICD-10-CM

## 2016-09-26 DIAGNOSIS — Z9119 Patient's noncompliance with other medical treatment and regimen: Secondary | ICD-10-CM

## 2016-09-26 DIAGNOSIS — J4540 Moderate persistent asthma, uncomplicated: Secondary | ICD-10-CM

## 2016-09-26 DIAGNOSIS — Z91199 Patient's noncompliance with other medical treatment and regimen due to unspecified reason: Secondary | ICD-10-CM

## 2016-09-26 MED ORDER — EPINEPHRINE 0.3 MG/0.3ML IJ SOAJ: 0.3000 mg | INTRAMUSCULAR | 1 refills | Status: DC

## 2016-09-26 MED ORDER — TRIAMCINOLONE ACETONIDE 0.1 % EX CREA: 1.0000 "application " | TOPICAL_CREAM | Freq: Two times a day (BID) | CUTANEOUS | 0 refills | Status: AC

## 2016-09-26 NOTE — Patient Instructions (Addendum)
A few things to remember from today's visit:   Essential hypertension, benign  BMI 45.0-49.9, adult ([place])  Allergic rhinitis, unspecified seasonality, unspecified trigger  Moderate persistent asthma, unspecified whether complicated  Shellfish allergy  YOU NEED TO TAKE SINGULAIR DAILY,STOP BENADRYL,QVAR WAS SENT TO PHARMACY LAST VISIT TO REPLACE ADVAIR.  IF LESION IS NOT BETTER OR GETS WORSE DERMATOLOGY CONSULTATION CAN BE CONSIDERED  Please be sure medication list is accurate. If a new problem present, please set up appointment sooner than planned today.

## 2016-09-26 NOTE — Progress Notes (Signed)
Pre visit review using our clinic review tool, if applicable. No additional management support is needed unless otherwise documented below in the visit note. 

## 2017-01-29 NOTE — Progress Notes (Signed)
HPI:   Ms.Carrol Lenna Sciara Cassar is a 61 y.o. female, who is here today for her routine physical. She has not had a CPE in years.   Regular exercise 3 or more time per week: "Exercising some." Following a healthy diet: She is trying to eat healthy. Smaller portions.  She lives alone.  Chronic medical problems: HTN, HLD, obesity,fatigue, allergic rhinitis, asthma,PUD, back pain, and knee OA among some.  Pap smear 20+ years. Hx of abnormal pap smears: Denies Hx of STD's: Denies. Denies sexual activity.  Denies abdominal pain, vaginal bleeding or discharge,dysuria,increased urinary frequency, gross hematuria,or decreased urine output.  G: 4 L:2 A:2 LMP at 57.  Mammogram: She has not had one in years. Colonoscopy: 10 years or more, she is not sure about date. Reporting EGD in 2010. Hep C screening: Not done before.  Asthma, she is using Albuterol inh as needed for exertional dyspnea and wheezing. She has not ben consistent with using QVAR 80 mcg. She is on Singulair 10 mg at bedtime.  Still having exertional dyspnea but not as bad as it was a few months ago.  She was referred to cardiologists x 2 due to exertional dyspnea, she did not keep appts.    Review of Systems  Constitutional: Positive for fatigue. Negative for appetite change, fever and unexpected weight change.  HENT: Negative for dental problem, hearing loss, nosebleeds, sore throat, trouble swallowing and voice change.   Eyes: Negative for redness and visual disturbance.  Respiratory: Positive for shortness of breath and wheezing. Negative for cough.   Cardiovascular: Negative for chest pain and leg swelling.  Gastrointestinal: Negative for abdominal pain, blood in stool, nausea and vomiting.       No changes in bowel habits.  Endocrine: Negative for cold intolerance, heat intolerance, polydipsia, polyphagia and polyuria.  Genitourinary: Negative for decreased urine volume, dysuria, hematuria, menstrual  problem, vaginal bleeding and vaginal discharge.       No breast tenderness or nipple discharge.  Musculoskeletal: Positive for arthralgias and back pain. Negative for gait problem and myalgias.  Skin: Negative for rash.  Allergic/Immunologic: Positive for environmental allergies.  Neurological: Negative for syncope, weakness and headaches.  Hematological: Negative for adenopathy. Does not bruise/bleed easily.  Psychiatric/Behavioral: Negative for confusion and sleep disturbance. The patient is nervous/anxious.   All other systems reviewed and are negative.     Current Outpatient Prescriptions on File Prior to Visit  Medication Sig Dispense Refill  . albuterol (PROVENTIL HFA;VENTOLIN HFA) 108 (90 Base) MCG/ACT inhaler Inhale 2 puffs into the lungs every 6 (six) hours as needed for wheezing or shortness of breath. 1 Inhaler 0  . beclomethasone (QVAR) 80 MCG/ACT inhaler Inhale 2 puffs into the lungs 2 (two) times daily. 1 Inhaler 4  . cetirizine (ZYRTEC) 10 MG tablet Take 10 mg by mouth daily.    Marland Kitchen EPINEPHrine 0.3 mg/0.3 mL IJ SOAJ injection Inject 0.3 mLs (0.3 mg total) into the muscle as directed. As needed for anaphylaxis 1 Device 1  . montelukast (SINGULAIR) 10 MG tablet Take 1 tablet (10 mg total) by mouth at bedtime. 30 tablet 3  . Spacer/Aero-Holding Chambers (AEROCHAMBER PLUS) inhaler Use as instructed 1 each 2   No current facility-administered medications on file prior to visit.      Past Medical History:  Diagnosis Date  . Allergy   . Asthma    as child  . Hypertension    No past surgical history on file.  Allergies  Allergen  Reactions  . Glutethimides Shortness Of Breath  . Penicillins Hives, Shortness Of Breath, Itching and Rash  . Shrimp [Shellfish Allergy] Anaphylaxis  . Pork-Derived Products Other (See Comments)    Head ache     Family History  Problem Relation Age of Onset  . Hypertension Mother   . Heart disease Father     Social History   Social  History  . Marital status: Legally Separated    Spouse name: N/A  . Number of children: N/A  . Years of education: N/A   Social History Main Topics  . Smoking status: Never Smoker  . Smokeless tobacco: Never Used  . Alcohol use No  . Drug use: No  . Sexual activity: Not Currently   Other Topics Concern  . None   Social History Narrative  . None     Vitals:   01/30/17 0806  BP: 126/80  Pulse: 77  Resp: 12  SpO2: 97%   Body mass index is 43.62 kg/m.   Wt Readings from Last 3 Encounters:  01/30/17 246 lb 4 oz (111.7 kg)  09/26/16 245 lb 4 oz (111.2 kg)  07/27/16 249 lb 2 oz (113 kg)    Physical Exam  Nursing note and vitals reviewed. Constitutional: She is oriented to person, place, and time. She appears well-developed. No distress.  HENT:  Head: Normocephalic and atraumatic.  Right Ear: Hearing, tympanic membrane, external ear and ear canal normal.  Left Ear: Hearing, tympanic membrane, external ear and ear canal normal.  Mouth/Throat: Uvula is midline, oropharynx is clear and moist and mucous membranes are normal.  Eyes: Pupils are equal, round, and reactive to light. Conjunctivae and EOM are normal.  Neck: No JVD present. No tracheal deviation present. No thyromegaly present.  Cardiovascular: Normal rate and regular rhythm.   No murmur heard. Pulses:      Dorsalis pedis pulses are 2+ on the right side, and 2+ on the left side.  Respiratory: Effort normal and breath sounds normal. No respiratory distress.  GI: Soft. She exhibits no mass. There is no hepatomegaly. There is no tenderness.  Genitourinary: No breast swelling or tenderness. There is no rash, tenderness or lesion on the right labia. There is no rash, tenderness or lesion on the left labia. Uterus is not enlarged and not tender. Cervix exhibits no motion tenderness, no discharge and no friability. Right adnexum displays no mass, no tenderness and no fullness. Left adnexum displays no mass, no tenderness  and no fullness. No erythema, tenderness or bleeding in the vagina. No vaginal discharge found.  Genitourinary Comments: Left nipple is not round, unchanged from pt report. No breast masses, skin changes,or nipple discharge.  Hypopigmentation changes noted on vulva, no skin lesions appreciated. Pap smear collected.  Musculoskeletal: She exhibits edema (Trace pitting LE eema,bilateral.). She exhibits no tenderness.       Lumbar back: She exhibits no tenderness and no bony tenderness.  No major deformity or signs of synovitis appreciated. Knee crepitus bilateral. Antalgic gait.   Lymphadenopathy:    She has no cervical adenopathy.       Right: No inguinal and no supraclavicular adenopathy present.       Left: No inguinal and no supraclavicular adenopathy present.  Neurological: She is alert and oriented to person, place, and time. She has normal strength. No cranial nerve deficit. Coordination and gait normal.  Reflex Scores:      Bicep reflexes are 2+ on the right side and 2+ on the left side.  Patellar reflexes are 2+ on the right side and 2+ on the left side. Skin: Skin is warm. No rash noted. No erythema.  Psychiatric: Her mood appears anxious. Cognition and memory are normal.  Well groomed, good eye contact.     ASSESSMENT AND PLAN:   Ms. Jaylina was seen today for annual exam.  Diagnoses and all orders for this visit:  Routine general medical examination at a health care facility   We discussed the importance of regular physical activity and healthy diet for prevention of chronic illness and/or complications. Preventive guidelines reviewed. Vaccination some given today,refused Zoster vaccine. Mammogram and colonoscopy will be arranged. Ca++ and vit D supplementation recommended. Next CPE in 1 year.   Breast cancer screening -     Mammogram Digital Screening; Future  Encounter for HCV screening test for high risk patient -     Hepatitis C antibody screen  Mixed  hyperlipidemia  Further recommendations will be given according to labs/imaging results. Low fat diet for now.  -     Lipid panel  Encounter for screening for HIV -     HIV antibody  Colon cancer screening -     Ambulatory referral to Gastroenterology  Diabetes mellitus screening -     Basic metabolic panel  Cervical cancer screening -     Cytology - PAP ([place])  Need for Tdap vaccination -     Tdap vaccine greater than or equal to 7yo IM  Need for 23-polyvalent pneumococcal polysaccharide vaccine -     Pneumococcal polysaccharide vaccine 23-valent greater than or equal to 2yo subcutaneous/IM   Moderate persistent asthma, unspecified whether complicated  Not well controlled. Non compliant with treatments. Strongly recommend using QVAR as instructed. Due to cost she could not have LABA/ICS.    Return in 4 months (on 06/01/2017) for asthma.     Moneka Mcquinn G. Martinique, MD  [facility]. [place] office.

## 2017-01-30 ENCOUNTER — Encounter: Payer: Self-pay | Admitting: Family Medicine

## 2017-01-30 ENCOUNTER — Ambulatory Visit (INDEPENDENT_AMBULATORY_CARE_PROVIDER_SITE_OTHER): Payer: 59 | Admitting: Family Medicine

## 2017-01-30 ENCOUNTER — Other Ambulatory Visit (HOSPITAL_COMMUNITY)
Admission: RE | Admit: 2017-01-30 | Discharge: 2017-01-30 | Disposition: A | Discharge status: Home / Self Care | Payer: 59 | Source: Ambulatory Visit | Attending: Family Medicine | Admitting: Family Medicine

## 2017-01-30 ENCOUNTER — Encounter: Payer: Self-pay | Admitting: Internal Medicine

## 2017-01-30 VITALS — BP 126/80 | HR 77 | Resp 12 | Ht 63.0 in | Wt 246.2 lb

## 2017-01-30 DIAGNOSIS — Z1211 Encounter for screening for malignant neoplasm of colon: Secondary | ICD-10-CM

## 2017-01-30 DIAGNOSIS — E782 Mixed hyperlipidemia: Secondary | ICD-10-CM | POA: Diagnosis not present

## 2017-01-30 DIAGNOSIS — J4540 Moderate persistent asthma, uncomplicated: Secondary | ICD-10-CM

## 2017-01-30 DIAGNOSIS — Z114 Encounter for screening for human immunodeficiency virus [HIV]: Secondary | ICD-10-CM | POA: Diagnosis not present

## 2017-01-30 DIAGNOSIS — Z124 Encounter for screening for malignant neoplasm of cervix: Secondary | ICD-10-CM | POA: Insufficient documentation

## 2017-01-30 DIAGNOSIS — Z1159 Encounter for screening for other viral diseases: Secondary | ICD-10-CM | POA: Diagnosis not present

## 2017-01-30 DIAGNOSIS — E785 Hyperlipidemia, unspecified: Secondary | ICD-10-CM | POA: Insufficient documentation

## 2017-01-30 DIAGNOSIS — Z0000 Encounter for general adult medical examination without abnormal findings: Secondary | ICD-10-CM | POA: Diagnosis not present

## 2017-01-30 DIAGNOSIS — Z23 Encounter for immunization: Secondary | ICD-10-CM

## 2017-01-30 DIAGNOSIS — Z9189 Other specified personal risk factors, not elsewhere classified: Secondary | ICD-10-CM | POA: Diagnosis not present

## 2017-01-30 DIAGNOSIS — Z1239 Encounter for other screening for malignant neoplasm of breast: Secondary | ICD-10-CM

## 2017-01-30 DIAGNOSIS — Z1231 Encounter for screening mammogram for malignant neoplasm of breast: Secondary | ICD-10-CM | POA: Diagnosis not present

## 2017-01-30 DIAGNOSIS — Z131 Encounter for screening for diabetes mellitus: Secondary | ICD-10-CM

## 2017-01-30 NOTE — Patient Instructions (Signed)
A few things to remember from today's visit:   Routine general medical examination at a health care facility  Breast cancer screening - Plan: Mammogram Digital Screening  Encounter for HCV screening test for high risk patient - Plan: Hepatitis C antibody screen  Screening for lipid disorders - Plan: Lipid panel  Encounter for screening for HIV - Plan: HIV antibody  Colon cancer screening - Plan: Ambulatory referral to Gastroenterology  Diabetes mellitus screening - Plan: Basic metabolic panel  Cervical cancer screening - Plan: Cytology - PAP ([place])    At least 150 minutes of moderate exercise per week, daily brisk walking for 15-30 min is a good exercise option. Healthy diet low in saturated (animal) fats and sweets and consisting of fresh fruits and vegetables, lean meats such as fish and white chicken and whole grains.   - Vaccines:  Tdap vaccine every 10 years.  Shingles vaccine recommended at age 91, could be given after 62 years of age but not sure about insurance coverage.  Pneumonia vaccines:  Prevnar 13 at 65 and Pneumovax at 55.  Screening recommendations for low/normal risk women:  Screening for diabetes at age 29-45 and every 3 years.  Cervical cancer prevention:  -HPV vaccination between 46-33 years old. -Pap smear starts at 62 years of age and continues periodically until 62 years old in low risk women. Pap smear every 3 years between 38 and 46 years old. Pap smear every 3 years between women 53 and older if pap smear negative and HPV screening negative.   -Breast cancer: Mammogram: There is disagreement between experts about when to start screening in low risk asymptomatic female but recent recommendations are to start screening at 71 and not later than 62 years old , every 1-2 years and after 62 yo q 2 years. Screening is recommended until 62 years old but some women can continue screening depending of healthy issues.   Colon cancer screening: starts  at 62 years old until 62 years old.  Cholesterol disorder screening at age 51 and every 3 years.  Also recommended:  1. Dental visit- Brush and floss your teeth twice daily; visit your dentist twice a year. 2. Eye doctor- Get an eye exam at least every 2 years. 3. Helmet use- Always wear a helmet when riding a bicycle, motorcycle, rollerblading or skateboarding. 4. Safe sex- If you may be exposed to sexually transmitted infections, use a condom. 5. Seat belts- Seat belts can save your live; always wear one. 6. Smoke/Carbon Monoxide detectors- These detectors need to be installed on the appropriate level of your home. Replace batteries at least once a year. 7. Skin cancer- When out in the sun please cover up and use sunscreen 15 SPF or higher. 8. Violence- If anyone is threatening or hurting you, please tell your healthcare provider.  9. Drink alcohol in moderation- Limit alcohol intake to one drink or less per day. Never drink and drive.   Please bring meds next visit.   Please be sure medication list is accurate. If a new problem present, please set up appointment sooner than planned today.

## 2017-02-01 LAB — CYTOLOGY - PAP
Adequacy: ABSENT
DIAGNOSIS: NEGATIVE
HPV (WINDOPATH): NOT DETECTED

## 2017-02-09 ENCOUNTER — Other Ambulatory Visit: Payer: 59

## 2017-02-27 ENCOUNTER — Encounter: Payer: Self-pay | Admitting: Family Medicine

## 2017-02-27 ENCOUNTER — Ambulatory Visit (INDEPENDENT_AMBULATORY_CARE_PROVIDER_SITE_OTHER): Payer: 59 | Admitting: Family Medicine

## 2017-02-27 VITALS — BP 130/80 | HR 93 | Temp 97.8°F | Resp 24 | Ht 63.0 in | Wt 246.5 lb

## 2017-02-27 DIAGNOSIS — J988 Other specified respiratory disorders: Secondary | ICD-10-CM

## 2017-02-27 DIAGNOSIS — J4541 Moderate persistent asthma with (acute) exacerbation: Secondary | ICD-10-CM | POA: Diagnosis not present

## 2017-02-27 DIAGNOSIS — R0600 Dyspnea, unspecified: Secondary | ICD-10-CM | POA: Diagnosis not present

## 2017-02-27 DIAGNOSIS — J309 Allergic rhinitis, unspecified: Secondary | ICD-10-CM

## 2017-02-27 MED ORDER — PREDNISONE 20 MG PO TABS: 40.0000 mg | ORAL_TABLET | Freq: Every day | ORAL | 0 refills | Status: AC

## 2017-02-27 MED ORDER — METHYLPREDNISOLONE ACETATE 80 MG/ML IJ SUSP
40.0000 mg | Freq: Once | INTRAMUSCULAR | Status: AC
  Administered 2017-02-27: 40 mg via INTRAMUSCULAR

## 2017-02-27 MED ORDER — ALBUTEROL SULFATE (2.5 MG/3ML) 0.083% IN NEBU: 2.5000 mg | INHALATION_SOLUTION | Freq: Three times a day (TID) | RESPIRATORY_TRACT | 1 refills | Status: DC | PRN

## 2017-02-27 MED ORDER — AZITHROMYCIN 250 MG PO TABS: ORAL_TABLET | ORAL | 0 refills | Status: AC

## 2017-02-27 MED ORDER — IPRATROPIUM-ALBUTEROL 0.5-2.5 (3) MG/3ML IN SOLN
1.5000 mL | Freq: Once | RESPIRATORY_TRACT | Status: AC
  Administered 2017-02-27: 1.5 mL via RESPIRATORY_TRACT

## 2017-02-27 MED ORDER — IPRATROPIUM-ALBUTEROL 0.5-2.5 (3) MG/3ML IN SOLN
3.0000 mL | Freq: Once | RESPIRATORY_TRACT | Status: AC
  Administered 2017-02-27: 3 mL via RESPIRATORY_TRACT

## 2017-02-27 NOTE — Progress Notes (Signed)
ACUTE VISIT  HPI:  Chief Complaint  Patient presents with  . cough & congestion    [name] is a 61 y.o.female here today complaining of respiratory symptoms since 01/29/2017 and getting worse. She has history of asthma, she has not been compliant with treatment.   10 days ago she started Symbicort 80-4 0.5 g twice daily and a "gray" inhaler, which I think his albuterol as needed.  She used nebulized medication a few days ago, she is not sure about which medication she took. According to patient, he was her sister's medication. She denies chills,rash, body aches.   Shortness of Breath  The current episode started 1 to 4 weeks ago. The problem occurs constantly. The problem has been gradually worsening. Associated symptoms include rhinorrhea, a sore throat, sputum production and wheezing. Pertinent negatives include no abdominal pain, chest pain, coryza, ear pain, fever, headaches, hemoptysis, leg swelling, rash, swollen glands, syncope or vomiting. The symptoms are aggravated by exercise. The patient has no known risk factors for DVT/PE. She has tried beta agonist inhalers and steroid inhalers for the symptoms. The treatment provided no relief. Her past medical history is significant for allergies and asthma.   Symptoms are also exacerbated by laying down at night.  Productive cough with clear sputum.  No Hx of recent overseas travel.she was recently in [place] but she was already having respiratory symptoms. No sick contact. No known insect bite.  Hx of allergies: Yes.  OTC medications for this problem: No OTC med. Neb treatment ? Albuterol.  According to patient, her daughter asked her to go to the ER but she doesn't want to do so.   She has had exertional dyspnea for a while now, she has been poorly compliant with asthma treatment. She was referred to cardiologist but she didn't keep appointments.  Allergic rhinitis: She is currently on Zyrtec 10  mg daily. She also takes Singulair 10 mg daily.  Mild nasal congestion and post nasal drainage. + "Scratchy" throat. She denies dysphagia or stridor.   Review of Systems  Constitutional: Positive for activity change, appetite change and fatigue. Negative for chills and fever.  HENT: Positive for postnasal drip, rhinorrhea and sore throat. Negative for congestion, ear pain, facial swelling, mouth sores, nosebleeds and trouble swallowing.   Eyes: Negative for redness and visual disturbance.  Respiratory: Positive for cough, sputum production, chest tightness, shortness of breath and wheezing. Negative for hemoptysis and stridor.   Cardiovascular: Negative for chest pain, leg swelling and syncope.  Gastrointestinal: Negative for abdominal pain, nausea and vomiting.  Genitourinary: Negative for decreased urine volume, dysuria and hematuria.  Musculoskeletal: Negative for gait problem and myalgias.  Skin: Negative for pallor and rash.  Allergic/Immunologic: Positive for environmental allergies.  Neurological: Negative for syncope, weakness and headaches.  Hematological: Negative for adenopathy. Does not bruise/bleed easily.  Psychiatric/Behavioral: Positive for sleep disturbance. Negative for confusion. The patient is nervous/anxious.       Current Outpatient Prescriptions on File Prior to Visit  Medication Sig Dispense Refill  . albuterol (PROVENTIL HFA;VENTOLIN HFA) 108 (90 Base) MCG/ACT inhaler Inhale 2 puffs into the lungs every 6 (six) hours as needed for wheezing or shortness of breath. 1 Inhaler 0  . cetirizine (ZYRTEC) 10 MG tablet Take 10 mg by mouth daily.    Marland Kitchen EPINEPHrine 0.3 mg/0.3 mL IJ SOAJ injection Inject 0.3 mLs (0.3 mg total) into the muscle as directed. As needed for anaphylaxis 1 Device 1  .  montelukast (SINGULAIR) 10 MG tablet Take 1 tablet (10 mg total) by mouth at bedtime. 30 tablet 3  . Spacer/Aero-Holding Chambers (AEROCHAMBER PLUS) inhaler Use as instructed 1 each  2   No current facility-administered medications on file prior to visit.      Past Medical History:  Diagnosis Date  . Allergy   . Asthma    as child  . Hypertension    Allergies  Allergen Reactions  . Glutethimides Shortness Of Breath  . Penicillins Hives, Shortness Of Breath, Itching and Rash  . Shrimp [Shellfish Allergy] Anaphylaxis  . Pork-Derived Products Other (See Comments)    Head ache     Social History   Social History  . Marital status: Legally Separated    Spouse name: N/A  . Number of children: N/A  . Years of education: N/A   Social History Main Topics  . Smoking status: Never Smoker  . Smokeless tobacco: Never Used  . Alcohol use No  . Drug use: No  . Sexual activity: Not Currently   Other Topics Concern  . None   Social History Narrative  . None    Vitals:   02/27/17 1407 02/27/17 1450  BP: 130/80   Pulse: 93   Resp: (!) 28 (!) 24  Temp: 97.8 F (36.6 C)   SpO2: 95% 97%   Body mass index is 43.67 kg/m.   Physical Exam  Nursing note and vitals reviewed. Constitutional: She is oriented to person, place, and time. She appears well-developed. She does not appear ill. She appears distressed.  HENT:  Head: Normocephalic and atraumatic.  Nose: Right sinus exhibits no maxillary sinus tenderness and no frontal sinus tenderness. Left sinus exhibits no maxillary sinus tenderness and no frontal sinus tenderness.  Mouth/Throat: Oropharynx is clear and moist and mucous membranes are normal.  Postnasal drainage.  Eyes: Pupils are equal, round, and reactive to light. Conjunctivae are normal.  Neck: No muscular tenderness present. No edema and no erythema present.  Cardiovascular: Normal rate and regular rhythm.   No murmur heard. Respiratory: Effort normal. No stridor. Tachypnea noted. No respiratory distress. She has wheezes. She has no rales.  Musculoskeletal: She exhibits no edema or tenderness.  Lymphadenopathy:    She has no cervical  adenopathy.  Neurological: She is alert and oriented to person, place, and time. She has normal strength. Gait normal.  Skin: Skin is warm. No rash noted. No erythema.  Psychiatric: She has a normal mood and affect.  Well groomed, poor eye contact. She is texting during the office visit    ASSESSMENT AND PLAN:   Jaynia was seen today for cough & congestion.  Diagnoses and all orders for this visit:  Dyspnea, unspecified type  Worse for the past few weeks. Improved after DuoNeb treatments. Further recommendations would be given according to imaging results. Clearly instructed about warning signs. Follow-up in a week.  -     DG Chest 2 View; Future  Moderate persistent asthma with acute exacerbation  Here in the office and after verbal consent and discussion of side effects, she received DuoNeb treatments x 2 (20 min apart). She tolerated medication well, she will continue with Albuterol neb tid for 7 days. Her niece has a nebulizer that she can use. We discussed side effects of medications prescribed today. She is not sure about insurance coverage for LABA/ ICS inhalers, Symbicort has helped, so we need to consider other options. She was instructed about warning signs and will follow in a week.  -  ipratropium-albuterol (DUONEB) 0.5-2.5 (3) MG/3ML nebulizer solution 3 mL; Take 3 mLs by nebulization once. -     methylPREDNISolone acetate (DEPO-MEDROL) injection 40 mg; Inject 0.5 mLs (40 mg total) into the muscle once. -     predniSONE (DELTASONE) 20 MG tablet; Take 2 tablets (40 mg total) by mouth daily with breakfast. -     albuterol (PROVENTIL) (2.5 MG/3ML) 0.083% nebulizer solution; Take 3 mLs (2.5 mg total) by nebulization every 8 (eight) hours as needed for wheezing or shortness of breath. -     DG Chest 2 View; Future -     ipratropium-albuterol (DUONEB) 0.5-2.5 (3) MG/3ML nebulizer solution 1.5 mL; Take 1.5 mLs by nebulization once.  Allergic rhinitis, unspecified  seasonality, unspecified trigger  She was instructed to continue Zyrtec 10 mg daily and Singulair 10 mg at night.   Respiratory tract infection  Today I do not appreciate rales or rhonchi on auscultation. Empiric antibiotic treatment was recommended, some side effects discussed. Further recommendations will be given according CXR to results.  -     azithromycin (ZITHROMAX) 250 MG tablet; 2 tabs day one then 1 tab daily for 4 days.    2:48 pm: Still wheezing diffuse bilateral. No rales, O2 sat improved but still mild tachypnea. 3:24 pm: Moderate diffuse wheezing, non productive cough, no rales, RR 20/min  She is reporting improvement of respiratory symptoms and clinically she improved after Duoneb treatment and Depo Medrol IM. As far as she is compliant with recommendations given today, I think she can be treated as outpatient.   -Ms. Izabella MARKAN CAZAREZ advised to seek attention immediately if symptoms worsen.     Lailana Shira G. [place], MD  [facility]. [place] office.

## 2017-02-27 NOTE — Patient Instructions (Signed)
A few things to remember from today's visit:   Dyspnea, unspecified type - Plan: DG Chest 2 View  Moderate persistent asthma with acute exacerbation - Plan: ipratropium-albuterol (DUONEB) 0.5-2.5 (3) MG/3ML nebulizer solution 3 mL, methylPREDNISolone acetate (DEPO-MEDROL) injection 40 mg, azithromycin (ZITHROMAX) 250 MG tablet, predniSONE (DELTASONE) 20 MG tablet, albuterol (PROVENTIL) (2.5 MG/3ML) 0.083% nebulizer solution, DG Chest 2 View  Allergic rhinitis, unspecified seasonality, unspecified trigger   Asthma Attack Prevention, Adult Although you may not be able to control the fact that you have asthma, you can take actions to prevent episodes of asthma (asthma attacks). These actions include:  Creating a written plan for managing and treating your asthma attacks (asthma action plan).  Monitoring your asthma.  Avoiding things that can irritate your airways or make your asthma symptoms worse (asthma triggers).  Taking your medicines as directed.  Acting quickly if you have signs or symptoms of an asthma attack.  What are some ways to prevent an asthma attack? Create a plan Work with your health care provider to create an asthma action plan. This plan should include:  A list of your asthma triggers and how to avoid them.  A list of symptoms that you experience during an asthma attack.  Information about when to take medicine and how much medicine to take.  Information to help you understand your peak flow measurements.  Contact information for your health care providers.  Daily actions that you can take to control asthma.  Monitor your asthma  To monitor your asthma:  Use your peak flow meter every morning and every evening for 2-3 weeks. Record the results in a journal. A drop in your peak flow numbers on one or more days may mean that you are starting to have an asthma attack, even if you are not having symptoms.  When you have asthma symptoms, write them down in a  journal.  Avoid asthma triggers  Work with your health care provider to find out what your asthma triggers are. This can be done by:  Being tested for allergies.  Keeping a journal that notes when asthma attacks occur and what may have contributed to them.  Asking your health care provider whether other medical conditions make your asthma worse.  Common asthma triggers include:  Dust.  Smoke. This includes campfire smoke and secondhand smoke from tobacco products.  Pet dander.  Trees, grasses or pollens.  Very cold, dry, or humid air.  Mold.  Foods that contain high amounts of sulfites.  Strong smells.  Engine exhaust and air pollution.  Aerosol sprays and fumes from household cleaners.  Household pests and their droppings, including dust mites and cockroaches.  Certain medicines, including NSAIDs.  Once you have determined your asthma triggers, take steps to avoid them. Depending on your triggers, you may be able to reduce the chance of an asthma attack by:  Keeping your home clean. Have someone dust and vacuum your home for you 1 or 2 times a week. If possible, have them use a high-efficiency particulate arrestance (HEPA) vacuum.  Washing your sheets weekly in hot water.  Using allergy-proof mattress covers and casings on your bed.  Keeping pets out of your home.  Taking care of mold and water problems in your home.  Avoiding areas where people smoke.  Avoiding using strong perfumes or odor sprays.  Avoid spending a lot of time outdoors when pollen counts are high and on very windy days.  Talking with your health care provider before stopping  or starting any new medicines.  Medicines Take over-the-counter and prescription medicines only as told by your health care provider. Many asthma attacks can be prevented by carefully following your medicine schedule. Taking your medicines correctly is especially important when you cannot avoid certain asthma  triggers. Even if you are doing well, do not stop taking your medicine and do not take less medicine. Act quickly If an asthma attack happens, acting quickly can decrease how severe it is and how long it lasts. Take these actions:  Pay attention to your symptoms. If you are coughing, wheezing, or having difficulty breathing, do not wait to see if your symptoms go away on their own. Follow your asthma action plan.  If you have followed your asthma action plan and your symptoms are not improving, call your health care provider or seek immediate medical care at the nearest hospital.  It is important to write down how often you need to use your fast-acting rescue inhaler. You can track how often you use an inhaler in your journal. If you are using your rescue inhaler more often, it may mean that your asthma is not under control. Adjusting your asthma treatment plan may help you to prevent future asthma attacks and help you to gain better control of your condition. How can I prevent an asthma attack when I exercise?  Exercise is a common asthma trigger. To prevent asthma attacks during exercise:  Follow advice from your health care provider about whether you should use your fast-acting inhaler before exercising. Many people with asthma experience exercise-induced bronchoconstriction (EIB). This condition often worsens during vigorous exercise in cold, humid, or dry environments. Usually, people with EIB can stay very active by using a fast-acting inhaler before exercising.  Avoid exercising outdoors in very cold or humid weather.  Avoid exercising outdoors when pollen counts are high.  Warm up and cool down when exercising.  Stop exercising right away if asthma symptoms start.  Consider taking part in exercises that are less likely to cause asthma symptoms such as:  Indoor swimming.  Biking.  Walking.  Hiking.  Playing football.  This information is not intended to replace advice given  to you by your health care provider. Make sure you discuss any questions you have with your health care provider. Document Released: 05/03/2009 Document Revised: 01/14/2016 Document Reviewed: 10/30/2015 Elsevier Interactive Patient Education  2017 [place].  Please be sure medication list is accurate. If a new problem present, please set up appointment sooner than planned today.

## 2017-03-01 ENCOUNTER — Ambulatory Visit (INDEPENDENT_AMBULATORY_CARE_PROVIDER_SITE_OTHER)
Admission: RE | Admit: 2017-03-01 | Discharge: 2017-03-01 | Disposition: A | Discharge status: Home / Self Care | Payer: 59 | Source: Ambulatory Visit | Attending: Family Medicine | Admitting: Family Medicine

## 2017-03-01 ENCOUNTER — Other Ambulatory Visit: Payer: 59

## 2017-03-01 DIAGNOSIS — R0600 Dyspnea, unspecified: Secondary | ICD-10-CM

## 2017-03-01 DIAGNOSIS — J4541 Moderate persistent asthma with (acute) exacerbation: Secondary | ICD-10-CM

## 2017-03-01 LAB — BASIC METABOLIC PANEL
BUN: 16 mg/dL (ref 6–23)
CHLORIDE: 101 meq/L (ref 96–112)
CO2: 29 mEq/L (ref 19–32)
Calcium: 10.1 mg/dL (ref 8.4–10.5)
Creatinine, Ser: 0.89 mg/dL (ref 0.40–1.20)
GFR: 82.63 mL/min (ref >60.00)
GLUCOSE: 97 mg/dL (ref 70–99)
POTASSIUM: 3.8 meq/L (ref 3.5–5.1)
Sodium: 139 mEq/L (ref 135–145)

## 2017-03-01 LAB — LIPID PANEL
Cholesterol: 235 mg/dL — ABNORMAL HIGH (ref 0–200)
HDL: 55.4 mg/dL (ref >39.00)
LDL Cholesterol: 160 mg/dL — ABNORMAL HIGH (ref 0–99)
NONHDL: 180.03
Total CHOL/HDL Ratio: 4
Triglycerides: 99 mg/dL (ref 0.0–149.0)
VLDL: 19.8 mg/dL (ref 0.0–40.0)

## 2017-03-01 NOTE — Addendum Note (Signed)
Addended by: Tomi Likens on: 03/01/2017 08:09 AM   Modules accepted: Orders

## 2017-03-02 LAB — HIV ANTIBODY (ROUTINE TESTING W REFLEX): HIV 1&2 Ab, 4th Generation: NONREACTIVE

## 2017-03-02 LAB — HEPATITIS C ANTIBODY
Hepatitis C Ab: NONREACTIVE
SIGNAL TO CUT-OFF: 0.01 (ref <1.00)

## 2017-03-05 NOTE — Progress Notes (Signed)
HPI:   Alejandra Vega is a 61 y.o. female, who is here today to follow on recent OV.   She was seen on 02/27/17 for asthma exacerbation.  She completed treatment with Azithromycin. Albuterol neb treatment tid and Prednisone recommended.  She decreased Albuterol neb to once daily in the morning because it was causing insomnia and nausea. She is taking 1/2 dose of Albuterol. Mild bitemporal headache , mild, alleviated by blowing her nose.  She has not been compliant with asthma treatments. She uses Symbicort as needed.   CXR 03/01/17: 1. Cardiac enlargement. 2. Pulmonary vascular congestion.  She denies orthopnea or PND. No known OSA.  Overall she is feeling better, no new symptoms reported. Cough and wheezing "every now and then."  She attributes symptoms to "contaminated" air conditioner unit in her apartment and humidity.  She has exertional dyspnea since 01/2016, not as bad as she did months ago. She denies chest pain, palpitations, or diaphoresis.  She was referred to cardiologists a couple times because dyspnea and abnormal EKG but she did not keep appts.   HLD:  She is trying to change her diet and is not interested in statin medication.  Lab Results  Component Value Date   CHOL 235 (H) 03/01/2017   HDL 55.40 03/01/2017   LDLCALC 160 (H) 03/01/2017   TRIG 99.0 03/01/2017   CHOLHDL 4 03/01/2017   She does not exercise regularly.   Review of Systems  Constitutional: Negative for activity change, appetite change, fatigue and fever.  HENT: Positive for congestion and rhinorrhea. Negative for mouth sores, nosebleeds, sinus pain and trouble swallowing.   Eyes: Negative for redness and visual disturbance.  Respiratory: Positive for cough and wheezing. Negative for shortness of breath.   Cardiovascular: Negative for chest pain, palpitations and leg swelling.  Gastrointestinal: Negative for abdominal pain, nausea and vomiting.       Negative for  changes in bowel habits.  Genitourinary: Negative for decreased urine volume and hematuria.  Skin: Negative for pallor and rash.  Allergic/Immunologic: Positive for environmental allergies.  Neurological: Positive for headaches. Negative for syncope and weakness.  Psychiatric/Behavioral: Positive for sleep disturbance. Negative for confusion.      Current Outpatient Prescriptions on File Prior to Visit  Medication Sig Dispense Refill  . cetirizine (ZYRTEC) 10 MG tablet Take 10 mg by mouth daily.    Marland Kitchen EPINEPHrine 0.3 mg/0.3 mL IJ SOAJ injection Inject 0.3 mLs (0.3 mg total) into the muscle as directed. As needed for anaphylaxis 1 Device 1  . montelukast (SINGULAIR) 10 MG tablet Take 1 tablet (10 mg total) by mouth at bedtime. 30 tablet 3  . Spacer/Aero-Holding Chambers (AEROCHAMBER PLUS) inhaler Use as instructed 1 each 2   No current facility-administered medications on file prior to visit.      Past Medical History:  Diagnosis Date  . Allergy   . Asthma    as child  . Hypertension    Allergies  Allergen Reactions  . Glutethimides Shortness Of Breath  . Penicillins Hives, Shortness Of Breath, Itching and Rash  . Shrimp [Shellfish Allergy] Anaphylaxis  . Pork-Derived Products Other (See Comments)    Head ache     Social History   Social History  . Marital status: Legally Separated    Spouse name: N/A  . Number of children: N/A  . Years of education: N/A   Social History Main Topics  . Smoking status: Never Smoker  . Smokeless tobacco: Never Used  .  Alcohol use No  . Drug use: No  . Sexual activity: Not Currently   Other Topics Concern  . None   Social History Narrative  . None    Vitals:   03/06/17 1044  BP: 130/80  Pulse: 83  Resp: 16  SpO2: 96%   Body mass index is 43.22 kg/m.   Physical Exam  Nursing note and vitals reviewed. Constitutional: She is oriented to person, place, and time. She appears well-developed. No distress.  HENT:  Head:  Normocephalic and atraumatic.  Mouth/Throat: Oropharynx is clear and moist and mucous membranes are normal.  Eyes: Pupils are equal, round, and reactive to light. Conjunctivae are normal.  Cardiovascular: Normal rate and regular rhythm.   No murmur heard. Pulses:      Dorsalis pedis pulses are 2+ on the right side, and 2+ on the left side.  Respiratory: Effort normal and breath sounds normal. No respiratory distress.  GI: Soft. She exhibits no mass. There is no hepatomegaly. There is no tenderness.  Musculoskeletal: She exhibits edema (1+ pitting LE edema,bilateral). She exhibits no tenderness.  Lymphadenopathy:    She has no cervical adenopathy.  Neurological: She is alert and oriented to person, place, and time. She has normal strength. Gait normal.  Skin: Skin is warm. No rash noted. No erythema.  Psychiatric: She has a normal mood and affect.  Well groomed, good eye contact.    ASSESSMENT AND PLAN:   Alejandra Vega was seen today for follow-up.  Diagnoses and all orders for this visit:  Moderate persistent asthma with acute exacerbation  Not well controlled. She agrees with daily Symbicort 80-4.5 mcg 1 puff bid, 2 puff causes cough. Continue Albuterol neb 1.5 ml daily as needed. Continue Singulair 10 mg at bedtime. If possible avoid trigger factors. F/U in 5 months.  -     budesonide-formoterol (SYMBICORT) 80-4.5 MCG/ACT inhaler; Inhale 1 puff into the lungs 2 (two) times daily. -     albuterol (PROVENTIL) (2.5 MG/3ML) 0.083% nebulizer solution; Take 3 mLs (2.5 mg total) by nebulization every 8 (eight) hours as needed for wheezing or shortness of breath.  BMI 45.0-49.9, adult ([place])  We discussed benefits of wt loss as well as adverse effects of obesity. Consistency with healthy diet and physical activity recommended. Daily brisk walking for 15-30 min as tolerated.  Mixed hyperlipidemia  She wants to try non pharmacologic treatment first. F/U in 5 months.  -      ECHOCARDIOGRAM COMPLETE; Future  Enlargement of cardiac chamber on chest x-ray -     ECHOCARDIOGRAM COMPLETE; Future   Rubie Ficco G. Martinique, MD  [facility]. [place] office.

## 2017-03-06 ENCOUNTER — Encounter: Payer: Self-pay | Admitting: Family Medicine

## 2017-03-06 ENCOUNTER — Ambulatory Visit (INDEPENDENT_AMBULATORY_CARE_PROVIDER_SITE_OTHER): Payer: 59 | Admitting: Family Medicine

## 2017-03-06 ENCOUNTER — Other Ambulatory Visit: Payer: Self-pay | Admitting: Family Medicine

## 2017-03-06 VITALS — BP 130/80 | HR 83 | Resp 16 | Ht 63.0 in | Wt 244.0 lb

## 2017-03-06 DIAGNOSIS — R0609 Other forms of dyspnea: Secondary | ICD-10-CM | POA: Diagnosis not present

## 2017-03-06 DIAGNOSIS — Z6841 Body Mass Index (BMI) 40.0 and over, adult: Secondary | ICD-10-CM

## 2017-03-06 DIAGNOSIS — Z1231 Encounter for screening mammogram for malignant neoplasm of breast: Secondary | ICD-10-CM

## 2017-03-06 DIAGNOSIS — J4541 Moderate persistent asthma with (acute) exacerbation: Secondary | ICD-10-CM | POA: Diagnosis not present

## 2017-03-06 DIAGNOSIS — I517 Cardiomegaly: Secondary | ICD-10-CM

## 2017-03-06 DIAGNOSIS — E782 Mixed hyperlipidemia: Secondary | ICD-10-CM

## 2017-03-06 MED ORDER — BUDESONIDE-FORMOTEROL FUMARATE 80-4.5 MCG/ACT IN AERO: 1.0000 | INHALATION_SPRAY | Freq: Two times a day (BID) | RESPIRATORY_TRACT | 6 refills | Status: DC

## 2017-03-06 MED ORDER — ALBUTEROL SULFATE (2.5 MG/3ML) 0.083% IN NEBU: 2.5000 mg | INHALATION_SOLUTION | Freq: Three times a day (TID) | RESPIRATORY_TRACT | 1 refills | Status: DC | PRN

## 2017-03-06 NOTE — Patient Instructions (Addendum)
A few things to remember from today's visit:   Moderate persistent asthma with acute exacerbation - Plan: budesonide-formoterol (SYMBICORT) 80-4.5 MCG/ACT inhaler, albuterol (PROVENTIL) (2.5 MG/3ML) 0.083% nebulizer solution  BMI 45.0-49.9, adult ([facility])  Mixed hyperlipidemia  Echo will be arranged.  Fat and Cholesterol Restricted Diet Getting too much fat and cholesterol in your diet may cause health problems. Following this diet helps keep your fat and cholesterol at normal levels. This can keep you from getting sick. What types of fat should I choose?  Choose monosaturated and polyunsaturated fats. These are found in foods such as olive oil, canola oil, flaxseeds, walnuts, almonds, and seeds.  Eat more omega-3 fats. Good choices include salmon, mackerel, sardines, tuna, flaxseed oil, and ground flaxseeds.  Limit saturated fats. These are in animal products such as meats, butter, and cream. They can also be in plant products such as palm oil, palm kernel oil, and coconut oil.  Avoid foods with partially hydrogenated oils in them. These contain trans fats. Examples of foods that have trans fats are stick margarine, some tub margarines, cookies, crackers, and other baked goods. What general guidelines do I need to follow?  Check food labels. Look for the words "trans fat" and "saturated fat."  When preparing a meal: ? Fill half of your plate with vegetables and green salads. ? Fill one fourth of your plate with whole grains. Look for the word "whole" as the first word in the ingredient list. ? Fill one fourth of your plate with lean protein foods.  Eat more foods that have fiber, like apples, carrots, beans, peas, and barley.  Eat more home-cooked foods. Eat less at restaurants and buffets.  Limit or avoid alcohol.  Limit foods high in starch and sugar.  Limit fried foods.  Cook foods without frying them. Baking, boiling, grilling, and broiling are all great options.  Lose  weight if you are overweight. Losing even a small amount of weight can help your overall health. It can also help prevent diseases such as diabetes and heart disease. What foods can I eat? Grains Whole grains, such as whole wheat or whole grain breads, crackers, cereals, and pasta. Unsweetened oatmeal, bulgur, barley, quinoa, or brown rice. Corn or whole wheat flour tortillas. Vegetables Fresh or frozen vegetables (raw, steamed, roasted, or grilled). Green salads. Fruits All fresh, canned (in natural juice), or frozen fruits. Meat and Other Protein Products Ground beef (85% or leaner), grass-fed beef, or beef trimmed of fat. Skinless chicken or [place]. Ground chicken or [place]. Pork trimmed of fat. All fish and seafood. Eggs. Dried beans, peas, or lentils. Unsalted nuts or seeds. Unsalted canned or dry beans. Dairy Low-fat dairy products, such as skim or 1% milk, 2% or reduced-fat cheeses, low-fat ricotta or cottage cheese, or plain low-fat yogurt. Fats and Oils Tub margarines without trans fats. Light or reduced-fat mayonnaise and salad dressings. Avocado. Olive, canola, sesame, or safflower oils. Natural peanut or almond butter (choose ones without added sugar and oil). The items listed above may not be a complete list of recommended foods or beverages. Contact your dietitian for more options. What foods are not recommended? Grains White bread. White pasta. White rice. Cornbread. Bagels, pastries, and croissants. Crackers that contain trans fat. Vegetables White potatoes. Corn. Creamed or fried vegetables. Vegetables in a cheese sauce. Fruits Dried fruits. Canned fruit in light or heavy syrup. Fruit juice. Meat and Other Protein Products Fatty cuts of meat. Ribs, chicken wings, bacon, sausage, bologna, salami, chitterlings, fatback, hot dogs,  bratwurst, and packaged luncheon meats. Liver and organ meats. Dairy Whole or 2% milk, cream, half-and-half, and cream cheese. Whole milk cheeses.  Whole-fat or sweetened yogurt. Full-fat cheeses. Nondairy creamers and whipped toppings. Processed cheese, cheese spreads, or cheese curds. Sweets and Desserts Corn syrup, sugars, honey, and molasses. Candy. Jam and jelly. Syrup. Sweetened cereals. Cookies, pies, cakes, donuts, muffins, and ice cream. Fats and Oils Butter, stick margarine, lard, shortening, ghee, or bacon fat. Coconut, palm kernel, or palm oils. Beverages Alcohol. Sweetened drinks (such as sodas, lemonade, and fruit drinks or punches). The items listed above may not be a complete list of foods and beverages to avoid. Contact your dietitian for more information. This information is not intended to replace advice given to you by your health care provider. Make sure you discuss any questions you have with your health care provider. Document Released: 11/14/2011 Document Revised: 01/20/2016 Document Reviewed: 08/14/2013 Elsevier Interactive Patient Education  [employer].   Please be sure medication list is accurate. If a new problem present, please set up appointment sooner than planned today.

## 2017-03-07 ENCOUNTER — Ambulatory Visit
Admission: RE | Admit: 2017-03-07 | Discharge: 2017-03-07 | Disposition: A | Discharge status: Home / Self Care | Payer: 59 | Source: Ambulatory Visit | Attending: Family Medicine | Admitting: Family Medicine

## 2017-03-07 DIAGNOSIS — Z1231 Encounter for screening mammogram for malignant neoplasm of breast: Secondary | ICD-10-CM | POA: Diagnosis not present

## 2017-03-09 ENCOUNTER — Telehealth: Payer: Self-pay | Admitting: *Deleted

## 2017-03-09 ENCOUNTER — Other Ambulatory Visit: Payer: Self-pay | Admitting: Family Medicine

## 2017-03-09 DIAGNOSIS — R928 Other abnormal and inconclusive findings on diagnostic imaging of breast: Secondary | ICD-10-CM

## 2017-03-09 NOTE — Telephone Encounter (Signed)
Yes, she should see cardiology and have appropriate testing first before colonoscopy

## 2017-03-09 NOTE — Telephone Encounter (Signed)
Dr. Hilarie Fredrickson,   I was reviewing patient's chart and she was recently seen by Dr. Martinique for Dyspnea. She does have a history of Asthma. Dr. Martinique wanted patient to have Echocardiogram and it looks as if she has not had as yet.  She was referred to Cardiologist x 2 for abnormal EKG and CXR that showed enlargement of her heart and patient canceled both appointments.  Her PV is 03/15/17 and Colonsocopy 03/29/17.  Do you feel patient needs cardiac clearance first before having procedure or see you in the office first? Please review and advise.  Thanks, B.Shareen Capwell, CMA-PV.

## 2017-03-12 NOTE — Telephone Encounter (Signed)
LMOM.  Cancelled PV and procedure. Alejandra Vega/PV

## 2017-03-16 ENCOUNTER — Ambulatory Visit
Admission: RE | Admit: 2017-03-16 | Discharge: 2017-03-16 | Disposition: A | Discharge status: Home / Self Care | Payer: 59 | Source: Ambulatory Visit | Attending: Family Medicine | Admitting: Family Medicine

## 2017-03-16 ENCOUNTER — Ambulatory Visit (HOSPITAL_COMMUNITY): Payer: 59

## 2017-03-16 DIAGNOSIS — R928 Other abnormal and inconclusive findings on diagnostic imaging of breast: Secondary | ICD-10-CM

## 2017-03-22 ENCOUNTER — Ambulatory Visit (HOSPITAL_COMMUNITY): Payer: 59 | Attending: Cardiovascular Disease

## 2017-03-22 ENCOUNTER — Other Ambulatory Visit: Payer: Self-pay

## 2017-03-22 DIAGNOSIS — R0609 Other forms of dyspnea: Secondary | ICD-10-CM

## 2017-03-22 DIAGNOSIS — Z8249 Family history of ischemic heart disease and other diseases of the circulatory system: Secondary | ICD-10-CM | POA: Insufficient documentation

## 2017-03-22 DIAGNOSIS — I371 Nonrheumatic pulmonary valve insufficiency: Secondary | ICD-10-CM | POA: Insufficient documentation

## 2017-03-22 DIAGNOSIS — I517 Cardiomegaly: Secondary | ICD-10-CM | POA: Insufficient documentation

## 2017-03-22 DIAGNOSIS — R0602 Shortness of breath: Secondary | ICD-10-CM | POA: Insufficient documentation

## 2017-03-22 DIAGNOSIS — I071 Rheumatic tricuspid insufficiency: Secondary | ICD-10-CM | POA: Diagnosis not present

## 2017-03-22 DIAGNOSIS — E785 Hyperlipidemia, unspecified: Secondary | ICD-10-CM | POA: Diagnosis not present

## 2017-03-29 ENCOUNTER — Encounter: Payer: 59 | Admitting: Internal Medicine

## 2017-04-04 ENCOUNTER — Telehealth: Payer: Self-pay | Admitting: Family Medicine

## 2017-04-04 ENCOUNTER — Encounter: Payer: Self-pay | Admitting: Internal Medicine

## 2017-04-04 NOTE — Telephone Encounter (Signed)
Pt would like results of echo done 10/25.

## 2017-04-27 ENCOUNTER — Encounter: Payer: 59 | Admitting: Family Medicine

## 2017-05-03 ENCOUNTER — Ambulatory Visit (AMBULATORY_SURGERY_CENTER): Payer: Self-pay

## 2017-05-03 VITALS — Ht 63.5 in | Wt 246.0 lb

## 2017-05-03 DIAGNOSIS — Z1211 Encounter for screening for malignant neoplasm of colon: Secondary | ICD-10-CM

## 2017-05-03 MED ORDER — NA SULFATE-K SULFATE-MG SULF 17.5-3.13-1.6 GM/177ML PO SOLN: 1.0000 | Freq: Once | ORAL | 0 refills | Status: AC

## 2017-05-03 NOTE — Progress Notes (Signed)
Per pt, no allergies to egg products.   SOY causes diarrhea!   Pt not taking any weight loss meds or using  O2 at home.  Sedation causes anxiety per pt!  Pt refused emmi video.

## 2017-05-17 ENCOUNTER — Encounter: Payer: 59 | Admitting: Internal Medicine

## 2017-06-03 NOTE — Progress Notes (Signed)
HPI:   [name] is a 62 y.o. female, who is here today for 4 months follow up.   She was last seen on 03/06/17.  Since her last OV she has been evaluated by GI.  Asthma:  She is on Albuterol neb, which she uses about once per month. She also has Albuterol inh , which she uses 2 times daily, at night time and morning for wheezing and SOB. Symptoms seem to be exacerbated by stress.  On Symbicort 80-4.5 mcg bid and Singulair 10 mg daily, she has not taken neither one. Nocturnal symptoms: "Not really"  She takes Zyrtec 10 mg at night, she started it a few weeks ago.  In general she feels like she is doing better compared with prior years.  Obesity:  Dietary changes since last OV: "Some", she has not been consistently due to holidays. Exercise: None. She has not noted wt changes.  Hypertension:   Currently on non pharmacologic treatment.  Last eye exam: Over a year ago,has appt today. BP readings at home: No  She has not noted visual changes, exertional chest pain,focal weakness, or edema.   Lab Results  Component Value Date   CREATININE 0.89 03/01/2017   BUN 16 03/01/2017   NA 139 03/01/2017   K 3.8 03/01/2017   CL 101 03/01/2017   CO2 29 03/01/2017    Frontal headache,pressure,almost daily,milder than her "normal" migraine. Exacerbated by ice cream intake,allergies,and by eating pizza. No associated sx. Last migraine headache 02/2017.   Review of Systems  Constitutional: Negative for activity change, appetite change, fatigue and fever.  HENT: Negative for mouth sores, nosebleeds and trouble swallowing.   Eyes: Negative for redness and visual disturbance.  Respiratory: Positive for cough ("little bit"), shortness of breath and wheezing.   Cardiovascular: Negative for chest pain, palpitations and leg swelling.  Gastrointestinal: Negative for abdominal pain, nausea and vomiting.       Negative for changes in bowel habits.  Endocrine: Negative  for cold intolerance and heat intolerance.  Genitourinary: Negative for decreased urine volume and hematuria.  Allergic/Immunologic: Positive for environmental allergies.  Neurological: Positive for headaches. Negative for syncope, weakness and numbness.  Psychiatric/Behavioral: Negative for confusion. The patient is nervous/anxious.      Current Outpatient Medications on File Prior to Visit  Medication Sig Dispense Refill  . acetaminophen (TYLENOL) 500 MG tablet Take 500 mg by mouth as needed.    Marland Kitchen albuterol (PROVENTIL) (2.5 MG/3ML) 0.083% nebulizer solution Take 3 mLs (2.5 mg total) by nebulization every 8 (eight) hours as needed for wheezing or shortness of breath. 150 mL 1  . budesonide-formoterol (SYMBICORT) 80-4.5 MCG/ACT inhaler Inhale 1 puff into the lungs 2 (two) times daily. 1 Inhaler 6  . cetirizine (ZYRTEC) 10 MG tablet Take 10 mg by mouth daily.    . cholecalciferol (VITAMIN D) 1000 units tablet Take 1,000 Units by mouth daily.    . Cyanocobalamin (VITAMIN B 12 PO) Take by mouth daily.    . diphenhydrAMINE (BENADRYL) 25 MG tablet Take 25 mg by mouth as needed.    Marland Kitchen EPINEPHrine 0.3 mg/0.3 mL IJ SOAJ injection Inject 0.3 mLs (0.3 mg total) into the muscle as directed. As needed for anaphylaxis 1 Device 1  . montelukast (SINGULAIR) 10 MG tablet Take 1 tablet (10 mg total) by mouth at bedtime. 30 tablet 3  . multivitamin (VIT W/EXTRA C) CHEW chewable tablet Chew 1 tablet by mouth daily.    Marland Kitchen Spacer/Aero-Holding Chambers (AEROCHAMBER  PLUS) inhaler Use as instructed 1 each 2   No current facility-administered medications on file prior to visit.      Past Medical History:  Diagnosis Date  . Allergy   . Anxiety   . Asthma    as child/ severe  . Gastric ulcer 2013  . Heart murmur   . Hypertension    no meds/ due to stress   Allergies  Allergen Reactions  . Bee Venom     Anaphylaxis  . Glutethimides Shortness Of Breath  . Penicillins Hives, Shortness Of Breath, Itching  and Rash  . Shrimp [Shellfish Allergy] Anaphylaxis  . Latex     Itching on hands  . Soy Allergy     Causes diarrhea  . Pork-Derived Products Other (See Comments)    Head ache     Social History   Socioeconomic History  . Marital status: Legally Separated    Spouse name: None  . Number of children: None  . Years of education: None  . Highest education level: None  Social Needs  . Financial resource strain: None  . Food insecurity - worry: None  . Food insecurity - inability: None  . Transportation needs - medical: None  . Transportation needs - non-medical: None  Occupational History  . None  Tobacco Use  . Smoking status: Never Smoker  . Smokeless tobacco: Never Used  Substance and Sexual Activity  . Alcohol use: No  . Drug use: No  . Sexual activity: Not Currently  Other Topics Concern  . None  Social History Narrative  . None    Vitals:   06/04/17 0936  BP: 132/84  Pulse: 83  Resp: 16  Temp: 97.6 F (36.4 C)  SpO2: 96%   Body mass index is 43.07 kg/m.  Wt Readings from Last 3 Encounters:  06/04/17 247 lb (112 kg)  05/03/17 246 lb (111.6 kg)  03/06/17 244 lb (110.7 kg)     Physical Exam  Nursing note and vitals reviewed. Constitutional: She is oriented to person, place, and time. She appears well-developed. No distress.  HENT:  Head: Normocephalic and atraumatic.  Nose: Right sinus exhibits no maxillary sinus tenderness and no frontal sinus tenderness. Left sinus exhibits no maxillary sinus tenderness and no frontal sinus tenderness.  Mouth/Throat: Oropharynx is clear and moist and mucous membranes are normal.  Hypertrophic turbinates. Normal sinus transillumination. Nasal voice.  Eyes: Conjunctivae and EOM are normal. Pupils are equal, round, and reactive to light.  Cardiovascular: Normal rate and regular rhythm.  No murmur heard. Pulses:      Dorsalis pedis pulses are 2+ on the right side, and 2+ on the left side.  Respiratory: Effort normal  and breath sounds normal. No respiratory distress.  GI: Soft. She exhibits no mass. There is no hepatomegaly. There is no tenderness.  Musculoskeletal: She exhibits no edema.  Lymphadenopathy:    She has no cervical adenopathy.  Neurological: She is alert and oriented to person, place, and time. She has normal strength. No cranial nerve deficit. Coordination and gait normal.  Skin: Skin is warm. No erythema.  Psychiatric: Her mood appears anxious.  Well groomed, good eye contact.     ASSESSMENT AND PLAN:   Ms. DUYEN BECKOM was seen today for 4 months follow-up.   Diagnoses and all orders for this visit:   Moderate persistent asthma, unspecified whether complicated  Acute exacerbation resolved. Still problem is not well controlled. Wt loss may help. Encouraged to use Symbicort 80-4.5 mcg as  instructed, twice daily. Singulair 10 mg daily. Continue Albuterol inhaler as needed. Instructed about warning signs. She refused pneumonia vaccine.  BMI 45.0-49.9, adult ([facility])  Wt stable. We discussed benefits of wt loss as well as adverse effects of obesity. Consistency with healthy diet and physical activity recommended.  Allergic rhinitis, unspecified seasonality, unspecified trigger  Recommend continuing Zyrtec 10 mg daily and to resume Singulair 10 mg daily. Nasal irrigations with saline as needed.  Headache, unspecified headache type  Possible causes discussed: Allergies, tension headache, and migraine months sounds. Clearly instructed about warning signs. Avoid trigger factors she has already identified.     -[name] was advised to return sooner than planned today if new concerns arise.       Jadence Kinlaw G. Martinique, MD  [facility]. [place] office.

## 2017-06-04 ENCOUNTER — Ambulatory Visit (INDEPENDENT_AMBULATORY_CARE_PROVIDER_SITE_OTHER): Payer: 59 | Admitting: Family Medicine

## 2017-06-04 ENCOUNTER — Encounter: Payer: Self-pay | Admitting: Family Medicine

## 2017-06-04 VITALS — BP 132/84 | HR 83 | Temp 97.6°F | Resp 16 | Ht 63.5 in | Wt 247.0 lb

## 2017-06-04 DIAGNOSIS — R51 Headache: Secondary | ICD-10-CM

## 2017-06-04 DIAGNOSIS — J4540 Moderate persistent asthma, uncomplicated: Secondary | ICD-10-CM

## 2017-06-04 DIAGNOSIS — R519 Headache, unspecified: Secondary | ICD-10-CM

## 2017-06-04 DIAGNOSIS — J309 Allergic rhinitis, unspecified: Secondary | ICD-10-CM | POA: Diagnosis not present

## 2017-06-04 DIAGNOSIS — Z6841 Body Mass Index (BMI) 40.0 and over, adult: Secondary | ICD-10-CM | POA: Diagnosis not present

## 2017-06-04 NOTE — Patient Instructions (Addendum)
A few things to remember from today's visit:   BMI 45.0-49.9, adult ([place])  Moderate persistent asthma with acute exacerbation  Allergic rhinitis, unspecified seasonality, unspecified trigger  Headache, unspecified headache type  Essential hypertension, benign ? Tension headache.  ? Allergies.  There are 2 forms of allergic rhinitis: . Seasonal (hay fever): Caused by an allergy to pollen and/or mold spores in the air. Pollen is the fine powder that comes from the stamen of flowering plants. It can be carried through the air and is easily inhaled. Symptoms are seasonal and usually occur in spring, late summer, and fall. Marland Kitchen Perennial: Caused by other allergens such as dust mites, pet hair or dander, or mold. Symptoms occur year-round.  Symptoms: Your symptoms can vary, depending on the severity of your allergies. Symptoms can include: Sneezing, coughing.itching (mostly eyes, nose, mouth, throat and skin),runny nose,stuffy nose.headache,pressure in the nose and cheeks,ear fullness and popping, sore throat.watery, red, or swollen eyes,dark circles under your eyes,trouble smelling, and sometimes hives.  Allergic rhinitis cannot be prevented. You can help your symptoms by avoiding the things that you are allergic, including: . Keeping windows closed. This is especially important during high-pollen seasons. . Washing your hands after petting animals. . Using dust- and mite-proof bedding and mattress covers. . Wearing glasses outside to protect your eyes. . Showering before bed to wash off allergens from hair and skin. You can also avoid things that can make your symptoms worse, such as: . aerosol sprays . air pollution . cold temperatures . humidity . irritating fumes . tobacco smoke . wind . wood smoke.   Antihistamines help reduce the sneezing, runny nose, and itchiness of allergies. These come in pill form and as nasal sprays. Allegra,Zyrtec,or Claritin are some  examples. Decongestants, such as pseudoephedrine and phenylephrine, help temporarily relieve the stuffy nose of allergies. Decongestants are found in many medicines and come as pills, nose sprays, and nose drops. They could increase heart rate and cause tachycardia and tremor. Nasal Afrin should not be used for more than 3 days because you can become dependent on them. This causes you to feel even more stopped-up when you try to quit using them.  Nasal sprays: steroids or antihistaminics. Over the counter intranasal sterids: Nasocort,Rhinocort,or Flonase.You won't notice their benefits for up to 2 weeks after starting them. Allergy shots or sublingual tablets when other treatment do not help.This is done by immunologists.   DASH Eating Plan DASH stands for "Dietary Approaches to Stop Hypertension." The DASH eating plan is a healthy eating plan that has been shown to reduce high blood pressure (hypertension). It may also reduce your risk for type 2 diabetes, heart disease, and stroke. The DASH eating plan may also help with weight loss. What are tips for following this plan? General guidelines  Avoid eating more than 2,300 mg (milligrams) of salt (sodium) a day. If you have hypertension, you may need to reduce your sodium intake to 1,500 mg a day.  Limit alcohol intake to no more than 1 drink a day for nonpregnant women and 2 drinks a day for men. One drink equals 12 oz of beer, 5 oz of wine, or 1 oz of hard liquor.  Work with your health care provider to maintain a healthy body weight or to lose weight. Ask what an ideal weight is for you.  Get at least 30 minutes of exercise that causes your heart to beat faster (aerobic exercise) most days of the week. Activities may include walking, swimming, or  biking.  Work with your health care provider or diet and nutrition specialist (dietitian) to adjust your eating plan to your individual calorie needs. Reading food labels  Check food labels for the  amount of sodium per serving. Choose foods with less than 5 percent of the Daily Value of sodium. Generally, foods with less than 300 mg of sodium per serving fit into this eating plan.  To find whole grains, look for the word "whole" as the first word in the ingredient list. Shopping  Buy products labeled as "low-sodium" or "no salt added."  Buy fresh foods. Avoid canned foods and premade or frozen meals. Cooking  Avoid adding salt when cooking. Use salt-free seasonings or herbs instead of table salt or sea salt. Check with your health care provider or pharmacist before using salt substitutes.  Do not fry foods. Cook foods using healthy methods such as baking, boiling, grilling, and broiling instead.  Cook with heart-healthy oils, such as olive, canola, soybean, or sunflower oil. Meal planning   Eat a balanced diet that includes: ? 5 or more servings of fruits and vegetables each day. At each meal, try to fill half of your plate with fruits and vegetables. ? Up to 6-8 servings of whole grains each day. ? Less than 6 oz of lean meat, poultry, or fish each day. A 3-oz serving of meat is about the same size as a deck of cards. One egg equals 1 oz. ? 2 servings of low-fat dairy each day. ? A serving of nuts, seeds, or beans 5 times each week. ? Heart-healthy fats. Healthy fats called Omega-3 fatty acids are found in foods such as flaxseeds and coldwater fish, like sardines, salmon, and mackerel.  Limit how much you eat of the following: ? Canned or prepackaged foods. ? Food that is high in trans fat, such as fried foods. ? Food that is high in saturated fat, such as fatty meat. ? Sweets, desserts, sugary drinks, and other foods with added sugar. ? Full-fat dairy products.  Do not salt foods before eating.  Try to eat at least 2 vegetarian meals each week.  Eat more home-cooked food and less restaurant, buffet, and fast food.  When eating at a restaurant, ask that your food be  prepared with less salt or no salt, if possible. What foods are recommended? The items listed may not be a complete list. Talk with your dietitian about what dietary choices are best for you. Grains Whole-grain or whole-wheat bread. Whole-grain or whole-wheat pasta. Brown rice. [place]. Bulgur. Whole-grain and low-sodium cereals. Pita bread. Low-fat, low-sodium crackers. Whole-wheat flour tortillas. Vegetables Fresh or frozen vegetables (raw, steamed, roasted, or grilled). Low-sodium or reduced-sodium tomato and vegetable juice. Low-sodium or reduced-sodium tomato sauce and tomato paste. Low-sodium or reduced-sodium canned vegetables. Fruits All fresh, dried, or frozen fruit. Canned fruit in natural juice (without added sugar). Meat and other protein foods Skinless chicken or [place]. Ground chicken or [place]. Pork with fat trimmed off. Fish and seafood. Egg whites. Dried beans, peas, or lentils. Unsalted nuts, nut butters, and seeds. Unsalted canned beans. Lean cuts of beef with fat trimmed off. Low-sodium, lean deli meat. Dairy Low-fat (1%) or fat-free (skim) milk. Fat-free, low-fat, or reduced-fat cheeses. Nonfat, low-sodium ricotta or cottage cheese. Low-fat or nonfat yogurt. Low-fat, low-sodium cheese. Fats and oils Soft margarine without trans fats. Vegetable oil. Low-fat, reduced-fat, or light mayonnaise and salad dressings (reduced-sodium). Canola, safflower, olive, soybean, and sunflower oils. Avocado. Seasoning and other foods Herbs. Spices.  Seasoning mixes without salt. Unsalted popcorn and pretzels. Fat-free sweets. What foods are not recommended? The items listed may not be a complete list. Talk with your dietitian about what dietary choices are best for you. Grains Baked goods made with fat, such as croissants, muffins, or some breads. Dry pasta or rice meal packs. Vegetables Creamed or fried vegetables. Vegetables in a cheese sauce. Regular canned vegetables (not  low-sodium or reduced-sodium). Regular canned tomato sauce and paste (not low-sodium or reduced-sodium). Regular tomato and vegetable juice (not low-sodium or reduced-sodium). Angie Fava. Olives. Fruits Canned fruit in a light or heavy syrup. Fried fruit. Fruit in cream or butter sauce. Meat and other protein foods Fatty cuts of meat. Ribs. Fried meat. Berniece Salines. Sausage. Bologna and other processed lunch meats. Salami. Fatback. Hotdogs. Bratwurst. Salted nuts and seeds. Canned beans with added salt. Canned or smoked fish. Whole eggs or egg yolks. Chicken or [place] with skin. Dairy Whole or 2% milk, cream, and half-and-half. Whole or full-fat cream cheese. Whole-fat or sweetened yogurt. Full-fat cheese. Nondairy creamers. Whipped toppings. Processed cheese and cheese spreads. Fats and oils Butter. Stick margarine. Lard. Shortening. Ghee. Bacon fat. Tropical oils, such as coconut, palm kernel, or palm oil. Seasoning and other foods Salted popcorn and pretzels. Onion salt, garlic salt, seasoned salt, table salt, and sea salt. Worcestershire sauce. Tartar sauce. Barbecue sauce. Teriyaki sauce. Soy sauce, including reduced-sodium. Steak sauce. Canned and packaged gravies. Fish sauce. Oyster sauce. Cocktail sauce. Horseradish that you find on the shelf. Ketchup. Mustard. Meat flavorings and tenderizers. Bouillon cubes. Hot sauce and Tabasco sauce. Premade or packaged marinades. Premade or packaged taco seasonings. Relishes. Regular salad dressings. Where to find more information:  National Heart, Lung, and [place]: https://wilson-eaton.com/  American Heart Association: www.heart.org Summary  The DASH eating plan is a healthy eating plan that has been shown to reduce high blood pressure (hypertension). It may also reduce your risk for type 2 diabetes, heart disease, and stroke.  With the DASH eating plan, you should limit salt (sodium) intake to 2,300 mg a day. If you have hypertension, you may need to reduce  your sodium intake to 1,500 mg a day.  When on the DASH eating plan, aim to eat more fresh fruits and vegetables, whole grains, lean proteins, low-fat dairy, and heart-healthy fats.  Work with your health care provider or diet and nutrition specialist (dietitian) to adjust your eating plan to your individual calorie needs. This information is not intended to replace advice given to you by your health care provider. Make sure you discuss any questions you have with your health care provider. Document Released: 05/04/2011 Document Revised: 05/08/2016 Document Reviewed: 05/08/2016 Elsevier Interactive Patient Education  [employer].  Please be sure medication list is accurate. If a new problem present, please set up appointment sooner than planned today.

## 2017-06-26 ENCOUNTER — Encounter: Payer: Self-pay | Admitting: Internal Medicine

## 2017-07-03 ENCOUNTER — Encounter: Payer: 59 | Admitting: Internal Medicine

## 2017-07-07 ENCOUNTER — Encounter: Payer: Self-pay | Admitting: Family Medicine

## 2017-07-07 ENCOUNTER — Ambulatory Visit (INDEPENDENT_AMBULATORY_CARE_PROVIDER_SITE_OTHER): Payer: 59 | Admitting: Family Medicine

## 2017-07-07 ENCOUNTER — Ambulatory Visit: Payer: 59 | Admitting: Internal Medicine

## 2017-07-07 VITALS — BP 160/90 | HR 81 | Temp 98.0°F | Ht 63.5 in | Wt 241.0 lb

## 2017-07-07 DIAGNOSIS — I10 Essential (primary) hypertension: Secondary | ICD-10-CM | POA: Diagnosis not present

## 2017-07-07 DIAGNOSIS — J4541 Moderate persistent asthma with (acute) exacerbation: Secondary | ICD-10-CM | POA: Diagnosis not present

## 2017-07-07 DIAGNOSIS — J309 Allergic rhinitis, unspecified: Secondary | ICD-10-CM

## 2017-07-07 DIAGNOSIS — F430 Acute stress reaction: Secondary | ICD-10-CM

## 2017-07-07 MED ORDER — ALPRAZOLAM 0.25 MG PO TABS: 0.2500 mg | ORAL_TABLET | Freq: Two times a day (BID) | ORAL | 0 refills | Status: DC | PRN

## 2017-07-07 NOTE — Progress Notes (Signed)
Subjective  CC:  Chief Complaint  Patient presents with  . Cough  . Nasal Congestion  . chest congestion  . Shortness of Breath    HPI: Alejandra Vega is a 62 y.o. female who presents to the office today to address the problems listed above in the chief complaint.  Pt reports h/o allergy sxs and sxs of SOB that have been worsening over the last 2 weeks. Has h/o allergies and takes zyrtec but won't take singulair due to fear of reactions. She has a h/o childhood asthma. I have reviewed most recent notes from PCP. Has symbicort and ventolin ordered but does use them. No f/c/s or GI sxs. Reports has sensation of sob that has been worsening. Reports having sob when outside walking to house from car and feeling of heaviness when lying down that resolves when sits up. Pt is under a lot of stress: financial, work and home life; she is taking a seminar on trying to improve things. She sounds overwhelmed. She has no h/o mood disorder. She is concerned that she has a systemic allergy that is the cause of her symptoms. She has been using a home nebulizer when she feels short of breath but this makes her feel anxious and jittery.   ROS - not sleeping well. No SI;    I reviewed the patients updated PMH, FH, and SocHx.    Patient Active Problem List   Diagnosis Date Noted  . Hyperlipidemia 01/30/2017  . 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 Allergic rhinitis 07/27/2016  . Essential hypertension, benign 04/14/2016  . Bilateral primary osteoarthritis of knee 03/08/2016  . BMI 45.0-49.9, adult ([place]) 12/30/2011  . Abdominal pain, acute, epigastric 12/25/2011  . Acute pancreatitis 12/25/2011  . Elevated BP 12/25/2011  . Asthma    Current Meds  Medication Sig  . acetaminophen (TYLENOL) 500 MG tablet Take 500 mg by mouth as needed.  Marland Kitchen albuterol (PROVENTIL) (2.5 MG/3ML) 0.083% nebulizer solution Take 3 mLs (2.5 mg total) by nebulization every 8 (eight) hours as needed for wheezing or shortness of  breath.  . cetirizine (ZYRTEC) 10 MG tablet Take 10 mg by mouth daily.  . cholecalciferol (VITAMIN D) 1000 units tablet Take 1,000 Units by mouth daily.  . Cyanocobalamin (VITAMIN B 12 PO) Take by mouth daily.  . diphenhydrAMINE (BENADRYL) 25 MG tablet Take 25 mg by mouth as needed.  Marland Kitchen EPINEPHrine 0.3 mg/0.3 mL IJ SOAJ injection Inject 0.3 mLs (0.3 mg total) into the muscle as directed. As needed for anaphylaxis  . multivitamin (VIT W/EXTRA C) CHEW chewable tablet Chew 1 tablet by mouth daily.  Marland Kitchen Spacer/Aero-Holding Chambers (AEROCHAMBER PLUS) inhaler Use as instructed    Allergies: Patient is allergic to bee venom; glutethimides; penicillins; shrimp [shellfish allergy]; latex; soy allergy; and pork-derived products. Family History: Patient family history includes Alcohol abuse in her father; Breast cancer in her maternal aunt; Heart disease in her father; Hypertension in her mother. Social History:  Patient  reports that  has never smoked. she has never used smokeless tobacco. She reports that she does not drink alcohol or use drugs.  Review of Systems: Constitutional: Negative for fever malaise or anorexia Cardiovascular: negative for chest pain Respiratory: negative for pleuritic chest pain Gastrointestinal: negative for abdominal pain  Objective  Vitals: BP (!) 160/90 (BP Location: Left Arm, Patient Position: Sitting, Cuff Size: Large)   Pulse 81   Temp 98 F (36.7 C) (Oral)   Ht 5' 3.5" (1.613 m)   Wt 241 lb (  109.3 kg)   SpO2 96%   BMI 42.02 kg/m  General: no acute distress , A&Ox3, speaks clearly, appears worried HEENT: PEERL, conjunctiva normal, nasal mucosa is inflamed and congested, Oropharynx moist,neck is supple Cardiovascular:  RRR without murmur or gallop.  Respiratory:  Good breath sounds bilaterally, CTAB with normal respiratory effort, no wheezing Skin:  Warm, no rashes  Assessment  1. Stress reaction   2. Allergic rhinitis, unspecified seasonality,  unspecified trigger   3. Moderate persistent asthma with acute exacerbation   4. Essential hypertension, benign      Plan   Stress reaction:  Discussed possibility that some of her sxs are related to stress/anxiety. Recommend f/u with PCP to discuss further. Xanax to be used for anxiety/panic type sxs.   Allergies are active: recommend starting the singulair in addition to her zyrtec. May warrant allergy referral if can't take meds.   Asthma: doubt asthma is cause of current sob sxs. To f/u with pcp on management.   Essential hypertension: bp is elevated today. F/u with pcp to recheck; may be related to stress today; adjust meds if persists.   Follow up: make appt with Dr. Martinique soon.    Commons side effects, risks, benefits, and alternatives for medications and treatment plan prescribed today were discussed, and the patient expressed understanding of the given instructions. Patient is instructed to call or message via MyChart if he/she has any questions or concerns regarding our treatment plan. No barriers to understanding were identified. We discussed Red Flag symptoms and signs in detail. Patient expressed understanding regarding what to do in case of urgent or emergency type symptoms.   Medication list was reconciled, printed and provided to the patient in AVS. Patient instructions and summary information was reviewed with the patient as documented in the AVS. This note was prepared with assistance of Dragon voice recognition software. Occasional wrong-word or sound-a-like substitutions may have occurred due to the inherent limitations of voice recognition software  No orders of the defined types were placed in this encounter.  Meds ordered this encounter  Medications  . ALPRAZolam (XANAX) 0.25 MG tablet    Sig: Take 1 tablet (0.25 mg total) by mouth 2 (two) times daily as needed for anxiety.    Dispense:  20 tablet    Refill:  0

## 2017-07-07 NOTE — Patient Instructions (Signed)
Please make an appointment with Dr. Martinique to discuss anxiety soon.   Stress and Stress Management Stress is a normal reaction to life events. It is what you feel when life demands more than you are used to or more than you can handle. Some stress can be useful. For example, the stress reaction can help you catch the last bus of the day, study for a test, or meet a deadline at work. But stress that occurs too often or for too long can cause problems. It can affect your emotional health and interfere with relationships and normal daily activities. Too much stress can weaken your immune system and increase your risk for physical illness. If you already have a medical problem, stress can make it worse. What are the causes? All sorts of life events may cause stress. An event that causes stress for one person may not be stressful for another person. Major life events commonly cause stress. These may be positive or negative. Examples include losing your job, moving into a new home, getting married, having a baby, or losing a loved one. Less obvious life events may also cause stress, especially if they occur day after day or in combination. Examples include working long hours, driving in traffic, caring for children, being in debt, or being in a difficult relationship. What are the signs or symptoms? Stress may cause emotional symptoms including, the following:  Anxiety. This is feeling worried, afraid, on edge, overwhelmed, or out of control.  Anger. This is feeling irritated or impatient.  Depression. This is feeling sad, down, helpless, or guilty.  Difficulty focusing, remembering, or making decisions.  Stress may cause physical symptoms, including the following:  Aches and pains. These may affect your head, neck, back, stomach, or other areas of your body.  Tight muscles or clenched jaw.  Low energy or trouble sleeping.  Stress may cause unhealthy behaviors, including the following:  Eating  to feel better (overeating) or skipping meals.  Sleeping too little, too much, or both.  Working too much or putting off tasks (procrastination).  Smoking, drinking alcohol, or using drugs to feel better.  How is this diagnosed? Stress is diagnosed through an assessment by your health care provider. Your health care provider will ask questions about your symptoms and any stressful life events.Your health care provider will also ask about your medical history and may order blood tests or other tests. Certain medical conditions and medicine can cause physical symptoms similar to stress. Mental illness can cause emotional symptoms and unhealthy behaviors similar to stress. Your health care provider may refer you to a mental health professional for further evaluation. How is this treated? Stress management is the recommended treatment for stress.The goals of stress management are reducing stressful life events and coping with stress in healthy ways. Techniques for reducing stressful life events include the following:  Stress identification. Self-monitor for stress and identify what causes stress for you. These skills may help you to avoid some stressful events.  Time management. Set your priorities, keep a calendar of events, and learn to say "no." These tools can help you avoid making too many commitments.  Techniques for coping with stress include the following:  Rethinking the problem. Try to think realistically about stressful events rather than ignoring them or overreacting. Try to find the positives in a stressful situation rather than focusing on the negatives.  Exercise. Physical exercise can release both physical and emotional tension. The key is to find a form of exercise you  enjoy and do it regularly.  Relaxation techniques. These relax the body and mind. Examples include yoga, meditation, tai chi, biofeedback, deep breathing, progressive muscle relaxation, listening to music, being  out in nature, journaling, and other hobbies. Again, the key is to find one or more that you enjoy and can do regularly.  Healthy lifestyle. Eat a balanced diet, get plenty of sleep, and do not smoke. Avoid using alcohol or drugs to relax.  Strong support network. Spend time with family, friends, or other people you enjoy being around.Express your feelings and talk things over with someone you trust.  Counseling or talktherapy with a mental health professional may be helpful if you are having difficulty managing stress on your own. Medicine is typically not recommended for the treatment of stress.Talk to your health care provider if you think you need medicine for symptoms of stress. Follow these instructions at home:  Keep all follow-up visits as directed by your health care provider.  Take all medicines as directed by your health care provider. Contact a health care provider if:  Your symptoms get worse or you start having new symptoms.  You feel overwhelmed by your problems and can no longer manage them on your own. Get help right away if:  You feel like hurting yourself or someone else. This information is not intended to replace advice given to you by your health care provider. Make sure you discuss any questions you have with your health care provider. Document Released: 11/08/2000 Document Revised: 10/21/2015 Document Reviewed: 01/07/2013 Elsevier Interactive Patient Education  2017 [employer].

## 2017-07-10 ENCOUNTER — Encounter: Payer: Self-pay | Admitting: Family Medicine

## 2017-07-10 ENCOUNTER — Ambulatory Visit: Payer: 59 | Admitting: Family Medicine

## 2017-07-10 ENCOUNTER — Ambulatory Visit (INDEPENDENT_AMBULATORY_CARE_PROVIDER_SITE_OTHER): Payer: 59 | Admitting: Family Medicine

## 2017-07-10 VITALS — BP 130/82 | HR 78 | Temp 98.0°F | Resp 16 | Ht 63.5 in | Wt 240.0 lb

## 2017-07-10 DIAGNOSIS — J309 Allergic rhinitis, unspecified: Secondary | ICD-10-CM | POA: Diagnosis not present

## 2017-07-10 DIAGNOSIS — J4521 Mild intermittent asthma with (acute) exacerbation: Secondary | ICD-10-CM

## 2017-07-10 DIAGNOSIS — F419 Anxiety disorder, unspecified: Secondary | ICD-10-CM | POA: Insufficient documentation

## 2017-07-10 DIAGNOSIS — I10 Essential (primary) hypertension: Secondary | ICD-10-CM

## 2017-07-10 DIAGNOSIS — K219 Gastro-esophageal reflux disease without esophagitis: Secondary | ICD-10-CM

## 2017-07-10 DIAGNOSIS — IMO0001 Reserved for inherently not codable concepts without codable children: Secondary | ICD-10-CM

## 2017-07-10 MED ORDER — PREDNISONE 20 MG PO TABS: 40.0000 mg | ORAL_TABLET | Freq: Every day | ORAL | 0 refills | Status: AC

## 2017-07-10 MED ORDER — MONTELUKAST SODIUM 10 MG PO TABS
10.0000 mg | ORAL_TABLET | Freq: Every day | ORAL | 3 refills | Status: DC
Start: 2017-07-10 — End: 2017-09-12

## 2017-07-10 MED ORDER — OMEPRAZOLE 20 MG PO CPDR: 20.0000 mg | DELAYED_RELEASE_CAPSULE | Freq: Two times a day (BID) | ORAL | 3 refills | Status: DC

## 2017-07-10 NOTE — Patient Instructions (Addendum)
A few things to remember from today's visit:   Moderate intermittent asthma with acute exacerbation - Plan: montelukast (SINGULAIR) 10 MG tablet, Ambulatory referral to Immunology, predniSONE (DELTASONE) 20 MG tablet  Allergic rhinitis - Plan: montelukast (SINGULAIR) 10 MG tablet, Ambulatory referral to Immunology  Essential hypertension, benign  Gastroesophageal reflux disease, esophagitis presence not specified  Anxiety disorder, unspecified type  Options: Web site for [employer], to look for patient assistance programs.   Omeprazole added.  Albuterol neb every 8 hours for a week then as needed for wheezing or shortness of breath.  Prednisone to start tomorrow.  Immunology referral placed.   Please be sure medication list is accurate. If a new problem present, please set up appointment sooner than planned today.

## 2017-07-10 NOTE — Assessment & Plan Note (Signed)
This problem could aggravate asthma and allergy rhinitis. I recommend Omeprazole 20 mg twice daily. Continue GERD precautions. Follow-up in 3 months.

## 2017-07-10 NOTE — Assessment & Plan Note (Signed)
Exacerbated by financial issues. Xanax to keep in case she needs it. Some side effects discussed. We may need to consider daily SSRI but at this time she doe snot think she can afford it.

## 2017-07-10 NOTE — Progress Notes (Signed)
HPI:   [name] is a 62 y.o. female, who is here today to follow on recent OV.   She was seen by me on 06/04/17.  She was evaluated on 07/07/2017 ([facility]) when she was complaining of cough, nasal congestion, chest discomfort, and dyspnea. She tells me that she was eating when suddenly she started with generalized pruritus, sneezing, nasal congestion, nonproductive cough, and wheezing.  She had some chest discomfort on upper/mid chest after coughing spell.   Symptoms were attributed to stress reaction, Xanax was recommended.She has not taken any because she has not had "another attack."  She is under some stress due to financial issues, her hours were decreased and she is trying to figure out how to keep out with her bills. She denies depressed mood or suicidal thoughts.  Asthma: She is having nocturnal symptoms, wheezing when laying down.  Nonproductive cough and dyspnea with exertion, she denies associated chest pain. She is not taking the Singulair 10 mg. She has used Albuterol neb, which helps. She is not on Symbicort.  She has not been able to afford her meds because she is working part time.  Symptoms have improved since Saturday.  Allergic rhinitis:  Episodes of sneezing, itchy nose, nasal congestion, rhinorrhea. She has not had fever, chills, or body aches. No sick contact. She is taking Zyrtec 10 mg daily.  She thinks most symptoms are exacerbated by certain foods, she has eliminated some that she has already identified but still having exacerbations. Benadryl helps.  She denies a stridor or dysphasia and has no noted oral or facial edema.  GERD: Heartburn has resolved after she has eliminated certain foods from her diet.  Intermittent epigastric pain that improved after decreasing portions and avoiding acidic food. She cannot describe type of pain, states that she feels like somebody "punched" her,no burning sensation. Hx of PUD in the  past.   She has been drinking liquid aloe in water,which has helped. Denies nausea, vomiting, changes in bowel habits, blood in stool or melena.  Hypertension: During acute visit BP was elevated at 160/90. She is currently on nonpharmacologic treatment.  In the past she was on Amlodipine, she discontinued because she wanted to try nonpharmacologic treatment. She is not checking BP at home. She follows low salt diet   Review of Systems  Constitutional: Positive for fatigue. Negative for activity change, appetite change and fever.  HENT: Positive for congestion, postnasal drip, rhinorrhea and sneezing. Negative for facial swelling, mouth sores, nosebleeds, sore throat and trouble swallowing.   Eyes: Negative for redness and visual disturbance.  Respiratory: Positive for cough, chest tightness, shortness of breath and wheezing.   Cardiovascular: Negative for chest pain, palpitations and leg swelling.  Gastrointestinal: Negative for abdominal pain, nausea and vomiting.       Negative for changes in bowel habits.  Genitourinary: Negative for decreased urine volume, dysuria and hematuria.  Musculoskeletal: Negative for gait problem and myalgias.  Skin: Negative for rash and wound.  Allergic/Immunologic: Positive for environmental allergies.  Neurological: Negative for syncope, weakness, numbness and headaches.  Hematological: Negative for adenopathy. Does not bruise/bleed easily.  Psychiatric/Behavioral: Positive for sleep disturbance. Negative for confusion and hallucinations. The patient is nervous/anxious.       Current Outpatient Medications on File Prior to Visit  Medication Sig Dispense Refill  . acetaminophen (TYLENOL) 500 MG tablet Take 500 mg by mouth as needed.    Marland Kitchen albuterol (PROVENTIL) (2.5 MG/3ML) 0.083% nebulizer solution Take  3 mLs (2.5 mg total) by nebulization every 8 (eight) hours as needed for wheezing or shortness of breath. 150 mL 1  . ALPRAZolam (XANAX) 0.25 MG  tablet Take 1 tablet (0.25 mg total) by mouth 2 (two) times daily as needed for anxiety. 20 tablet 0  . cetirizine (ZYRTEC) 10 MG tablet Take 10 mg by mouth daily.    . cholecalciferol (VITAMIN D) 1000 units tablet Take 1,000 Units by mouth daily.    . Cyanocobalamin (VITAMIN B 12 PO) Take by mouth daily.    . diphenhydrAMINE (BENADRYL) 25 MG tablet Take 25 mg by mouth as needed.    Marland Kitchen EPINEPHrine 0.3 mg/0.3 mL IJ SOAJ injection Inject 0.3 mLs (0.3 mg total) into the muscle as directed. As needed for anaphylaxis 1 Device 1  . multivitamin (VIT W/EXTRA C) CHEW chewable tablet Chew 1 tablet by mouth daily.    Marland Kitchen Spacer/Aero-Holding Chambers (AEROCHAMBER PLUS) inhaler Use as instructed 1 each 2   No current facility-administered medications on file prior to visit.      Past Medical History:  Diagnosis Date  . Allergy   . Anxiety   . Asthma    as child/ severe  . Gastric ulcer 2013  . Heart murmur   . Hypertension    no meds/ due to stress    Allergies  Allergen Reactions  . Bee Venom     Anaphylaxis  . Glutethimides Shortness Of Breath  . Penicillins Hives, Shortness Of Breath, Itching and Rash  . Shrimp [Shellfish Allergy] Anaphylaxis  . Latex     Itching on hands  . Soy Allergy     Causes diarrhea  . Pork-Derived Products Other (See Comments)    Head ache    Family History  Problem Relation Age of Onset  . Hypertension Mother   . Heart disease Father   . Alcohol abuse Father   . Breast cancer Maternal Aunt     Social History   Socioeconomic History  . Marital status: Legally Separated    Spouse name: None  . Number of children: None  . Years of education: None  . Highest education level: None  Social Needs  . Financial resource strain: None  . Food insecurity - worry: None  . Food insecurity - inability: None  . Transportation needs - medical: None  . Transportation needs - non-medical: None  Occupational History  . None  Tobacco Use  . Smoking status:  Never Smoker  . Smokeless tobacco: Never Used  Substance and Sexual Activity  . Alcohol use: No  . Drug use: No  . Sexual activity: Not Currently  Other Topics Concern  . None  Social History Narrative  . None    Vitals:   07/10/17 1224  BP: 130/82  Pulse: 78  Resp: 16  Temp: 98 F (36.7 C)  SpO2: 98%   Body mass index is 41.85 kg/m.   Physical Exam  Nursing note and vitals reviewed. Constitutional: She is oriented to person, place, and time. She appears well-developed. No distress.  HENT:  Head: Normocephalic and atraumatic.  Nose: Rhinorrhea present. Right sinus exhibits no maxillary sinus tenderness and no frontal sinus tenderness. Left sinus exhibits no maxillary sinus tenderness and no frontal sinus tenderness.  Mouth/Throat: Oropharynx is clear and moist and mucous membranes are normal.  Hypertrophic turbinates. Nasal congestion. Postnasal drainage.  Eyes: Conjunctivae are normal. Pupils are equal, round, and reactive to light.  Cardiovascular: Normal rate and regular rhythm.  No murmur heard.  Pulses:      Dorsalis pedis pulses are 2+ on the right side, and 2+ on the left side.  Respiratory: Effort normal. No respiratory distress. She has wheezes. She has no rales.  GI: Soft. She exhibits no mass. There is no hepatomegaly. There is no tenderness.  Musculoskeletal: She exhibits no edema or tenderness.  Lymphadenopathy:    She has no cervical adenopathy.  Neurological: She is alert and oriented to person, place, and time. She has normal strength. Gait normal.  Skin: Skin is warm. No erythema.  Psychiatric: Her mood appears anxious.  Well groomed, good eye contact.    [name] was seen today for follow-up.   Orders Placed This Encounter  Procedures  . Ambulatory referral to Immunology   -     montelukast (SINGULAIR) 10 MG tablet; Take 1 tablet (10 mg total) by mouth at bedtime. -     Ambulatory referral to Immunology -     predniSONE (DELTASONE) 20 MG  tablet; Take 2 tablets (40 mg total) by mouth daily with breakfast for 5 days. -     Ambulatory referral to Immunology -     omeprazole (PRILOSEC) 20 MG capsule; Take 1 capsule (20 mg total) by mouth 2 (two) times daily before a meal.   Essential hypertension, benign BP today improved. She has not been interested in pharmacologic treatment. Recommend monitoring BP at home. Educated about possible complications of elevated BP.  Allergic rhinitis Continue Zyrtec 10 mg daily. Recommend trying to get Singulair 10 mg daily. Nasal saline irrigations also recommended. Referral to immunologist placed.  Moderate intermittent asthma She can afford Albuterol neb,so continue it tid as needed. She does not have ICS/LABA,cannot afford it. Singulair 10 mg to start. Referral to immunologist placed.  Refused neb treatment in the office. After verbal consent she received Depo Medrol 40 mg IM here in the office. Prednisone to start tomorrow,some side effects discussed.  Anxiety disorder, unspecified Exacerbated by financial issues. Xanax to keep in case she needs it. Some side effects discussed. We may need to consider daily SSRI but at this time she doe snot think she can afford it.   GERD (gastroesophageal reflux disease) This problem could aggravate asthma and allergy rhinitis. I recommend Omeprazole 20 mg twice daily. Continue GERD precautions. Follow-up in 3 months.  12:25 Pm to 1:09 pm  40 min face to face OV. > 50% was dedicated to discussion of Dx, prognosis, treatment options, possible complications if problems are not adequately controlled. Also discussed some side effects of medications.  She is not sure if she can afford her medications, she tells me to send the prescriptions but does not promise she will take them all. Appointment with immunologist will be arranged. She was clearly instructed about warning signs. I will see her back in 3 months.      Mervin Ramires G. Martinique,  MD  [facility]. [place] office.

## 2017-07-10 NOTE — Assessment & Plan Note (Addendum)
She can afford Albuterol neb,so continue it tid as needed. She does not have ICS/LABA,cannot afford it. Singulair 10 mg to start. Referral to immunologist placed.  Refused neb treatment in the office. After verbal consent she received Depo Medrol 40 mg IM here in the office. Prednisone to start tomorrow,some side effects discussed.

## 2017-07-10 NOTE — Assessment & Plan Note (Signed)
Continue Zyrtec 10 mg daily. Recommend trying to get Singulair 10 mg daily. Nasal saline irrigations also recommended. Referral to immunologist placed.

## 2017-07-10 NOTE — Assessment & Plan Note (Signed)
BP today improved. She has not been interested in pharmacologic treatment. Recommend monitoring BP at home. Educated about possible complications of elevated BP.

## 2017-07-31 ENCOUNTER — Encounter: Payer: Self-pay | Admitting: Allergy and Immunology

## 2017-08-06 ENCOUNTER — Ambulatory Visit: Payer: 59 | Admitting: Family Medicine

## 2017-08-16 ENCOUNTER — Other Ambulatory Visit: Payer: Self-pay

## 2017-08-16 ENCOUNTER — Ambulatory Visit (AMBULATORY_SURGERY_CENTER): Payer: Self-pay | Admitting: *Deleted

## 2017-08-16 VITALS — Ht 63.0 in | Wt 242.0 lb

## 2017-08-16 DIAGNOSIS — Z1211 Encounter for screening for malignant neoplasm of colon: Secondary | ICD-10-CM

## 2017-08-16 MED ORDER — NA SULFATE-K SULFATE-MG SULF 17.5-3.13-1.6 GM/177ML PO SOLN: 1.0000 | Freq: Once | ORAL | 0 refills | Status: AC

## 2017-08-16 NOTE — Progress Notes (Signed)
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

## 2017-08-28 ENCOUNTER — Ambulatory Visit (INDEPENDENT_AMBULATORY_CARE_PROVIDER_SITE_OTHER): Payer: 59 | Admitting: Family Medicine

## 2017-08-28 ENCOUNTER — Encounter: Payer: Self-pay | Admitting: Family Medicine

## 2017-08-28 VITALS — BP 150/88 | HR 79 | Temp 98.0°F | Resp 20 | Ht 63.0 in | Wt 244.1 lb

## 2017-08-28 DIAGNOSIS — I10 Essential (primary) hypertension: Secondary | ICD-10-CM

## 2017-08-28 DIAGNOSIS — J4541 Moderate persistent asthma with (acute) exacerbation: Secondary | ICD-10-CM | POA: Diagnosis not present

## 2017-08-28 DIAGNOSIS — J309 Allergic rhinitis, unspecified: Secondary | ICD-10-CM

## 2017-08-28 MED ORDER — BUDESONIDE 0.25 MG/2ML IN SUSP: 0.2500 mg | Freq: Two times a day (BID) | RESPIRATORY_TRACT | 1 refills | Status: DC

## 2017-08-28 NOTE — Progress Notes (Signed)
ACUTE VISIT  HPI:  Chief Complaint  Patient presents with  . Cough    possible allergies    [name] is a 62 y.o.female here today complaining of 3 weeks days of respiratory symptoms.  Symptoms were worse 3 days ago with chest tightness and SOB. Improved after Albuterol neb treatment. Exacerbated by seasonal changes("trees blooming" )and fumes exposure ,alleviated by rest and avoidance of known trigger factors.   Cough  This is a recurrent problem. The current episode started 1 to 4 weeks ago. The problem has been waxing and waning. The problem occurs every few hours. The cough is non-productive. Associated symptoms include nasal congestion, postnasal drip, rhinorrhea, shortness of breath and wheezing. Pertinent negatives include no chest pain, chills, ear congestion, ear pain, eye redness, fever, headaches, heartburn, hemoptysis, myalgias, rash, sore throat or weight loss. The symptoms are aggravated by exercise, pollens, fumes and dust. Risk factors for lung disease include occupational exposure. She has tried rest (Zyrtec 10 mg daily) for the symptoms. The treatment provided mild relief. Her past medical history is significant for asthma and environmental allergies.    No Hx of recent travel. No sick contact. No known insect bite.  Hx of allergies: Allergic rhinitis and asthma. She has not resumed Singulair 10 mg. Albuterol neb tid as needed and home remedies have helped,last used 2 days ago. She has appt with immunologist 09/16/17.  BP mildly elevated today. Hx of HTN,she is on non pharmacologic treatment. She is not checking BP. States that she stopped checking it because it was "good": 130-140/90.     Review of Systems  Constitutional: Positive for fatigue. Negative for appetite change, chills, fever and weight loss.  HENT: Positive for congestion, postnasal drip, rhinorrhea and sneezing. Negative for ear pain, mouth sores, sinus pressure, sore  throat, trouble swallowing and voice change.   Eyes: Negative for discharge, redness and itching.  Respiratory: Positive for cough, shortness of breath and wheezing. Negative for hemoptysis.   Cardiovascular: Negative for chest pain, palpitations and leg swelling.  Gastrointestinal: Negative for abdominal pain, diarrhea, heartburn, nausea and vomiting.  Genitourinary: Negative for decreased urine volume and hematuria.  Musculoskeletal: Negative for myalgias.  Skin: Negative for rash.  Allergic/Immunologic: Positive for environmental allergies.  Neurological: Negative for syncope, weakness and headaches.  Hematological: Negative for adenopathy. Does not bruise/bleed easily.  Psychiatric/Behavioral: Negative for confusion. The patient is nervous/anxious.       Current Outpatient Medications on File Prior to Visit  Medication Sig Dispense Refill  . albuterol (PROVENTIL HFA;VENTOLIN HFA) 108 (90 Base) MCG/ACT inhaler Inhale 2 puffs into the lungs every 6 (six) hours as needed for wheezing or shortness of breath.    Marland Kitchen albuterol (PROVENTIL) (2.5 MG/3ML) 0.083% nebulizer solution Take 3 mLs (2.5 mg total) by nebulization every 8 (eight) hours as needed for wheezing or shortness of breath. 150 mL 1  . cetirizine (ZYRTEC) 10 MG tablet Take 10 mg by mouth daily.    . Cyanocobalamin (VITAMIN B 12 PO) Take by mouth daily.    . diphenhydrAMINE (BENADRYL) 25 MG tablet Take 25 mg by mouth as needed.    Marland Kitchen EPINEPHrine 0.3 mg/0.3 mL IJ SOAJ injection Inject 0.3 mLs (0.3 mg total) into the muscle as directed. As needed for anaphylaxis 1 Device 1  . Spacer/Aero-Holding Chambers (AEROCHAMBER PLUS) inhaler Use as instructed 1 each 2  . acetaminophen (TYLENOL) 500 MG tablet Take 500 mg by mouth as needed.    Marland Kitchen  ALPRAZolam (XANAX) 0.25 MG tablet Take 1 tablet (0.25 mg total) by mouth 2 (two) times daily as needed for anxiety. (Patient not taking: Reported on 08/16/2017) 20 tablet 0  . cholecalciferol (VITAMIN D)  1000 units tablet Take 1,000 Units by mouth daily.    . montelukast (SINGULAIR) 10 MG tablet Take 1 tablet (10 mg total) by mouth at bedtime. 90 tablet 3  . multivitamin (VIT W/EXTRA C) CHEW chewable tablet Chew 1 tablet by mouth daily.    Marland Kitchen omeprazole (PRILOSEC) 20 MG capsule Take 1 capsule (20 mg total) by mouth 2 (two) times daily before a meal. 60 capsule 3   No current facility-administered medications on file prior to visit.      Past Medical History:  Diagnosis Date  . Allergy   . Anemia    with pregnancy   . Anxiety   . Asthma    as child/ severe  . Gastric ulcer 2013  . Heart murmur   . Hypertension    no meds/ due to stress   Allergies  Allergen Reactions  . Bee Venom     Anaphylaxis  . Glutethimides Shortness Of Breath  . Penicillins Hives, Shortness Of Breath, Itching and Rash  . Shrimp [Shellfish Allergy] Anaphylaxis  . Latex     Itching on hands  . Soy Allergy     Causes diarrhea  . Pork-Derived Products Other (See Comments)    Head ache     Social History   Socioeconomic History  . Marital status: Legally Separated    Spouse name: Not on file  . Number of children: Not on file  . Years of education: Not on file  . Highest education level: Not on file  Occupational History  . Not on file  Social Needs  . Financial resource strain: Not on file  . Food insecurity:    Worry: Not on file    Inability: Not on file  . Transportation needs:    Medical: Not on file    Non-medical: Not on file  Tobacco Use  . Smoking status: Never Smoker  . Smokeless tobacco: Never Used  Substance and Sexual Activity  . Alcohol use: No  . Drug use: No  . Sexual activity: Not Currently  Lifestyle  . Physical activity:    Days per week: Not on file    Minutes per session: Not on file  . Stress: Not on file  Relationships  . Social connections:    Talks on phone: Not on file    Gets together: Not on file    Attends religious service: Not on file    Active  member of club or organization: Not on file    Attends meetings of clubs or organizations: Not on file    Relationship status: Not on file  Other Topics Concern  . Not on file  Social History Narrative  . Not on file    Vitals:   08/28/17 1001  BP: (!) 150/88  Pulse: 79  Resp: 20  Temp: 98 F (36.7 C)  SpO2: 98%   Body mass index is 43.24 kg/m.   Physical Exam  Nursing note and vitals reviewed. Constitutional: She is oriented to person, place, and time. She appears well-developed. She does not appear ill. No distress.  HENT:  Head: Normocephalic and atraumatic.  Mouth/Throat: Oropharynx is clear and moist and mucous membranes are normal.  Hypertrophic turbinates. Nasal voice.  Eyes: Conjunctivae are normal.  Neck: No muscular tenderness present. No edema  and no erythema present.  Cardiovascular: Normal rate and regular rhythm.  No murmur heard. Respiratory: Effort normal. No stridor. No respiratory distress. She has wheezes (mild at the end of expiration). She has no rhonchi. She has no rales.  Musculoskeletal: She exhibits no edema.  Lymphadenopathy:       Head (right side): No submandibular adenopathy present.       Head (left side): No submandibular adenopathy present.    She has no cervical adenopathy.  Neurological: She is alert and oriented to person, place, and time. She has normal strength. Gait normal.  Skin: Skin is warm. No rash noted. No erythema.  Psychiatric: Her mood appears anxious.  Well groomed, good eye contact.      ASSESSMENT AND PLAN:  [name] was seen today for cough.  Diagnoses and all orders for this visit:  Moderate persistent asthma with exacerbation  Here in the office she received Duoneb neb treatment: No wheezing ,rales or rhonchi. Continue Albuterol neb tid as needed. We will hold on oral Prednisone for now.  She cannot afford inh Symbicort or QVAR, neb sl seems to be cheaper,so Pulmicort neb added bid. Recommend resuming  Singulair 10 mg. Keep apt with immunologist. Instructed about warning signs.  -     budesonide (PULMICORT) 0.25 MG/2ML nebulizer solution; Take 2 mLs (0.25 mg total) by nebulization 2 (two) times daily.  Allergic rhinitis, unspecified seasonality, unspecified trigger  Continue Zyrtec 10 mg. Saline nasal irrigations as needed may also help. Not interested in intranasal steroids.  Essential hypertension, benign  Elevated today. Possible complications of elevated BP discussed. Recommend monitoring BP at home. F/U in 4 weeks    -[name] was advised to seek attention immediately if symptoms worsen or to follow if they persist or new concerns arise.       Holleigh Crihfield G. Martinique, MD  [facility] [place] office.

## 2017-08-28 NOTE — Patient Instructions (Signed)
A few things to remember from today's visit:   Moderate persistent asthma with exacerbation - Plan: budesonide (PULMICORT) 0.25 MG/2ML nebulizer solution  Allergic rhinitis, unspecified seasonality, unspecified trigger  Essential hypertension, benign   Please be sure medication list is accurate. If a new problem present, please set up appointment sooner than planned today.

## 2017-08-29 ENCOUNTER — Encounter: Payer: Self-pay | Admitting: *Deleted

## 2017-08-29 ENCOUNTER — Telehealth: Payer: Self-pay | Admitting: Family Medicine

## 2017-08-29 NOTE — Telephone Encounter (Unsigned)
Copied from [place] [id number]. Topic: Quick Communication - See Telephone Encounter >> Aug 29, 2017  2:14 PM Hewitt Shorts wrote: CRM for notification. See Telephone encounter for: 08/29/17. Pt is needing a work note to state when she was in the office returning back to work the next day she states she can pick this up  Friday   Best number [phone]

## 2017-08-29 NOTE — Telephone Encounter (Signed)
Spoke with patient, informed her that her note is ready for pick-up at the front desk. Patient verbalized understanding.

## 2017-08-30 ENCOUNTER — Ambulatory Visit (AMBULATORY_SURGERY_CENTER): Payer: 59 | Admitting: Internal Medicine

## 2017-08-30 ENCOUNTER — Other Ambulatory Visit: Payer: Self-pay

## 2017-08-30 ENCOUNTER — Encounter: Payer: Self-pay | Admitting: Internal Medicine

## 2017-08-30 ENCOUNTER — Ambulatory Visit: Payer: Self-pay | Admitting: *Deleted

## 2017-08-30 VITALS — BP 185/102 | HR 83 | Temp 97.5°F | Resp 18 | Ht 63.0 in | Wt 244.0 lb

## 2017-08-30 DIAGNOSIS — D128 Benign neoplasm of rectum: Secondary | ICD-10-CM

## 2017-08-30 DIAGNOSIS — Z1211 Encounter for screening for malignant neoplasm of colon: Secondary | ICD-10-CM

## 2017-08-30 DIAGNOSIS — D127 Benign neoplasm of rectosigmoid junction: Secondary | ICD-10-CM | POA: Diagnosis not present

## 2017-08-30 DIAGNOSIS — K621 Rectal polyp: Secondary | ICD-10-CM | POA: Diagnosis not present

## 2017-08-30 DIAGNOSIS — D129 Benign neoplasm of anus and anal canal: Secondary | ICD-10-CM

## 2017-08-30 MED ORDER — SODIUM CHLORIDE 0.9 % IV SOLN: 500.0000 mL | Freq: Once | INTRAVENOUS | Status: DC

## 2017-08-30 NOTE — Patient Instructions (Signed)
Discharge instructions given. Handouts on polyps,diverticulosis and hemorrhoids. Resume previous medications. YOU HAD AN ENDOSCOPIC PROCEDURE TODAY AT THE [facility]:   Refer to the procedure report that was given to you for any specific questions about what was found during the examination.  If the procedure report does not answer your questions, please call your gastroenterologist to clarify.  If you requested that your care partner not be given the details of your procedure findings, then the procedure report has been included in a sealed envelope for you to review at your convenience later.  YOU SHOULD EXPECT: Some feelings of bloating in the abdomen. Passage of more gas than usual.  Walking can help get rid of the air that was put into your GI tract during the procedure and reduce the bloating. If you had a lower endoscopy (such as a colonoscopy or flexible sigmoidoscopy) you may notice spotting of blood in your stool or on the toilet paper. If you underwent a bowel prep for your procedure, you may not have a normal bowel movement for a few days.  Please Note:  You might notice some irritation and congestion in your nose or some drainage.  This is from the oxygen used during your procedure.  There is no need for concern and it should clear up in a day or so.  SYMPTOMS TO REPORT IMMEDIATELY:   Following lower endoscopy (colonoscopy or flexible sigmoidoscopy):  Excessive amounts of blood in the stool  Significant tenderness or worsening of abdominal pains  Swelling of the abdomen that is new, acute  Fever of 100F or higher   For urgent or emergent issues, a gastroenterologist can be reached at any hour by calling [phone].   DIET:  We do recommend a small meal at first, but then you may proceed to your regular diet.  Drink plenty of fluids but you should avoid alcoholic beverages for 24 hours.  ACTIVITY:  You should plan to take it easy for the rest of today and you  should NOT DRIVE or use heavy machinery until tomorrow (because of the sedation medicines used during the test).    FOLLOW UP: Our staff will call the number listed on your records the next business day following your procedure to check on you and address any questions or concerns that you may have regarding the information given to you following your procedure. If we do not reach you, we will leave a message.  However, if you are feeling well and you are not experiencing any problems, there is no need to return our call.  We will assume that you have returned to your regular daily activities without incident.  If any biopsies were taken you will be contacted by phone or by letter within the next 1-3 weeks.  Please call us at [phone] if you have not heard about the biopsies in 3 weeks.    SIGNATURES/CONFIDENTIALITY: You and/or your care partner have signed paperwork which will be entered into your electronic medical record.  These signatures attest to the fact that that the information above on your After Visit Summary has been reviewed and is understood.  Full responsibility of the confidentiality of this discharge information lies with you and/or your care-partner. 

## 2017-08-30 NOTE — Progress Notes (Signed)
Upon arrival to the admitting area, Amy Mason Jim called and told Probation officer that pt has no insurance coverage for this procedure.  Both Lorriane Shire and Amy had called the patient on four separate occasions (March 22, 25, 27, 28th) to try to to reach her with this information.  Amy stated she had spoken with Jenny Reichmann at [facility] and had a reference number- [id number]. I explained the situation to the patient.  She states last year her company had sent her a letter with a list of wellness tasks that needed to be completed.  She then states that they contacted her in December to sign up for new benefits, which she never did.  I told her the cost of the procedure can run anywhere from $1500 to $3000.  I explained that Amy informed me that her company would be billed by our facility but the claim would be rejected.  Then [facility] would contact her with a bill, but that she could set up a self-payment plan.  I explained the situation to Dr. Hilarie Fredrickson, who stated the decision is totally up to the patient. She can proceed and be self pay or he would be happy to order a FIT but if it was positive, she would still need a colonoscopy.  Johann Capers RN and I emphasized to the patient several times that this decision was totally up to her; if she needed to talk think about or talk to her daughter, we would be happy to wait a bit to get her checked in.  She states she wanted to proceed with the procedure today because she had already prepped for it.

## 2017-08-30 NOTE — Telephone Encounter (Signed)
She called in c/o BP being high.  She had a colonoscopy done today.   She thinks it's this from the stress of that but the doctor there suggested I see Dr. Martinique. See triage notes.   I made an appt with Dr. Martinique for 08/31/17 at 11:00. Reason for Disposition . Systolic BP  >= 208 OR Diastolic >= 138  Answer Assessment - Initial Assessment Questions 1. BLOOD PRESSURE: "What is the blood pressure?" "Did you take at least two measurements 5 minutes apart?"     200/105 first went in to have colonoscopy this morning,   188/99 10:30am after colonoscopy done.   185/102 was last BP they took at the center.  I came home, ate a sandwich and went to bed.    2. ONSET: "When did you take your blood pressure?"     See above.  Tuesday while I was there in the office my BP was 150/80.  I've also been using albuterol for a cough he gave me Tuesday in the office. 3. HOW: "How did you obtain the blood pressure?" (e.g., visiting nurse, automatic home BP monitor)     Done at the colonoscopy center. 4. HISTORY: "Do you have a history of high blood pressure?"     It was long time ago.   It's been like 134/80's. 5. MEDICATIONS: "Are you taking any medications for blood pressure?" "Have you missed any doses recently?"     No.    6. OTHER SYMPTOMS: "Do you have any symptoms?" (e.g., headache, chest pain, blurred vision, difficulty breathing, weakness)     No. 7. PREGNANCY: "Is there any chance you are pregnant?" "When was your last menstrual period?"     Not asked  Protocols used: HIGH BLOOD PRESSURE-A-AH

## 2017-08-30 NOTE — Progress Notes (Signed)
Patient instructed to see her primary care doctor for elevated blood pressure. Strip printed off to given to her primary care doctor. Dr. Hilarie Fredrickson aware of blood pressure.

## 2017-08-30 NOTE — Op Note (Signed)
[facility] Patient Name: Kisha Messman Procedure Date: 08/30/2017 9:46 AM MRN: [id number] Endoscopist: Jerene Bears , MD Age: 62 Referring MD:  Date of Birth: 16-Sep-1955 Gender: Female Account #: [id number] Procedure:                Colonoscopy Indications:              Screening for colorectal malignant neoplasm, This                            is the patient's first colonoscopy Medicines:                Monitored Anesthesia Care Procedure:                Pre-Anesthesia Assessment:                           - Prior to the procedure, a History and Physical                            was performed, and patient medications and                            allergies were reviewed. The patient's tolerance of                            previous anesthesia was also reviewed. The risks                            and benefits of the procedure and the sedation                            options and risks were discussed with the patient.                            All questions were answered, and informed consent                            was obtained. Prior Anticoagulants: The patient has                            taken no previous anticoagulant or antiplatelet                            agents. ASA Grade Assessment: III - A patient with                            severe systemic disease. After reviewing the risks                            and benefits, the patient was deemed in                            satisfactory condition to undergo the procedure.  After obtaining informed consent, the colonoscope                            was passed under direct vision. Throughout the                            procedure, the patient's blood pressure, pulse, and                            oxygen saturations were monitored continuously. The                            Colonoscope was introduced through the anus and                            advanced to the the  cecum, identified by                            appendiceal orifice and ileocecal valve. The                            colonoscopy was performed without difficulty. The                            patient tolerated the procedure well. The quality                            of the bowel preparation was good. The ileocecal                            valve, appendiceal orifice, and rectum were                            photographed. Scope In: 10:01:07 AM Scope Out: 10:17:31 AM Scope Withdrawal Time: 0 hours 10 minutes 53 seconds  Total Procedure Duration: 0 hours 16 minutes 24 seconds  Findings:                 The digital rectal exam was normal.                           A 5 mm polyp was found in the recto-sigmoid colon.                            The polyp was sessile. The polyp was removed with a                            cold snare. Resection and retrieval were complete.                           Two sessile polyps were found in the rectum. The                            polyps were 2 to 5 mm in size.  These polyps were                            removed with a cold snare. Resection and retrieval                            were complete.                           Multiple small and large-mouthed diverticula were                            found in the sigmoid colon, descending colon,                            hepatic flexure and ascending colon.                           Internal hemorrhoids were found during                            retroflexion. The hemorrhoids were small. Complications:            No immediate complications. Estimated Blood Loss:     Estimated blood loss was minimal. Impression:               - One 5 mm polyp at the recto-sigmoid colon,                            removed with a cold snare. Resected and retrieved.                           - Two 2 to 5 mm polyps in the rectum, removed with                            a cold snare. Resected and retrieved.                            - Moderate diverticulosis in the sigmoid colon, in                            the descending colon, at the hepatic flexure and in                            the ascending colon.                           - Internal hemorrhoids. Recommendation:           - Patient has a contact number available for                            emergencies. The signs and symptoms of potential                            delayed complications were discussed with  the                            patient. Return to normal activities tomorrow.                            Written discharge instructions were provided to the                            patient.                           - Resume previous diet.                           - Continue present medications.                           - Await pathology results.                           - Repeat colonoscopy is recommended. The                            colonoscopy date will be determined after pathology                            results from today's exam become available for                            review. Jerene Bears, MD 08/30/2017 10:22:52 AM This report has been signed electronically.

## 2017-08-30 NOTE — Progress Notes (Signed)
Pt's states no medical or surgical changes since previsit or office visit. 

## 2017-08-30 NOTE — Progress Notes (Signed)
Patients bp elevated. Improved once on stretcher, taken maually. Patient with recent asthma exacerbation, seen at PCP on 08/28/17. Patient stating no inhalers or nebulizers since 08/28/17.sa02 97% on room air .No symptoms of dyspnea. Informed Gaye Pollack, CRNA and Dr. Hilarie Fredrickson. Will proceed with procedure

## 2017-08-30 NOTE — Progress Notes (Signed)
A and O x3. Report to RN. Tolerated MAC anesthesia well.

## 2017-08-31 ENCOUNTER — Ambulatory Visit (INDEPENDENT_AMBULATORY_CARE_PROVIDER_SITE_OTHER): Payer: 59 | Admitting: Family Medicine

## 2017-08-31 ENCOUNTER — Ambulatory Visit: Payer: 59 | Admitting: Family Medicine

## 2017-08-31 ENCOUNTER — Telehealth: Payer: Self-pay

## 2017-08-31 ENCOUNTER — Encounter: Payer: Self-pay | Admitting: Family Medicine

## 2017-08-31 VITALS — BP 160/100 | HR 85 | Temp 97.7°F | Resp 16 | Ht 63.0 in | Wt 243.5 lb

## 2017-08-31 DIAGNOSIS — I10 Essential (primary) hypertension: Secondary | ICD-10-CM | POA: Diagnosis not present

## 2017-08-31 DIAGNOSIS — R9431 Abnormal electrocardiogram [ECG] [EKG]: Secondary | ICD-10-CM

## 2017-08-31 MED ORDER — AMLODIPINE BESYLATE 5 MG PO TABS: 5.0000 mg | ORAL_TABLET | Freq: Every day | ORAL | 0 refills | Status: DC

## 2017-08-31 NOTE — Assessment & Plan Note (Addendum)
Not well controlled. Possible complications of elevated BP discussed. She is reluctant to start pharmacologic treatment, she tolerated amlodipine well in the past. I strongly recommend starting amlodipine 5 mg.  Eye exam is current. I will see her back in 4 weeks, unless she can see cardiologist, in which case I will see her in 09/2017.

## 2017-08-31 NOTE — Progress Notes (Signed)
Chief Complaint  Patient presents with  . Hypertension    started yesterday while getting colonoscopy    [name] is a 62 y.o.female, who is here today to follow on HTN. Yesterday during colonoscopy her BP was elevated at 200/104, 185/102, 199/101, 196/103, 188/99, and 190/90.  She was instructed to follow with PCP.  She has Hx of HTN and was on pharmacologic treatment, Amlodipine. She discontinued because she wanted to try changing her diet.  She has not checked her BP in the past few months, states that "it was fine" when she did so.  She has not noted chest pain, focal weakness, or edema. She had frontal headache 2 days ago, attributed to fasting for colonoscopy.  She has had xertional dyspnea for a few months now, which we have attributed to her asthma and no compliance with treatment recommendations.  She had abnormal EKG in 2017. She has been referred to cardiologist x 2 but she has not kept appointment.  Lab Results  Component Value Date   CREATININE 0.89 03/01/2017   BUN 16 03/01/2017   NA 139 03/01/2017   K 3.8 03/01/2017   CL 101 03/01/2017   CO2 29 03/01/2017   Echo 02/2017 LVEF 55-60, LVH, and elevated filling pressure.    Review of Systems  Constitutional: Negative for activity change, appetite change, fatigue and fever.  HENT: Negative for mouth sores, nosebleeds and trouble swallowing.   Eyes: Negative for redness and visual disturbance.  Respiratory: Positive for cough and shortness of breath. Negative for wheezing.   Cardiovascular: Negative for chest pain, palpitations and leg swelling.  Gastrointestinal: Negative for abdominal pain, nausea and vomiting.       Negative for changes in bowel habits.  Genitourinary: Negative for decreased urine volume, dysuria and hematuria.  Allergic/Immunologic: Positive for environmental allergies.  Neurological: Negative for syncope, weakness and headaches.     Current Outpatient Medications on  File Prior to Visit  Medication Sig Dispense Refill  . acetaminophen (TYLENOL) 500 MG tablet Take 500 mg by mouth as needed.    Marland Kitchen albuterol (PROVENTIL HFA;VENTOLIN HFA) 108 (90 Base) MCG/ACT inhaler Inhale 2 puffs into the lungs every 6 (six) hours as needed for wheezing or shortness of breath.    Marland Kitchen albuterol (PROVENTIL) (2.5 MG/3ML) 0.083% nebulizer solution Take 3 mLs (2.5 mg total) by nebulization every 8 (eight) hours as needed for wheezing or shortness of breath. 150 mL 1  . budesonide (PULMICORT) 0.25 MG/2ML nebulizer solution Take 2 mLs (0.25 mg total) by nebulization 2 (two) times daily. 60 mL 1  . cetirizine (ZYRTEC) 10 MG tablet Take 10 mg by mouth daily.    . cholecalciferol (VITAMIN D) 1000 units tablet Take 1,000 Units by mouth daily.    . Cyanocobalamin (VITAMIN B 12 PO) Take by mouth daily.    . diphenhydrAMINE (BENADRYL) 25 MG tablet Take 25 mg by mouth as needed.    Marland Kitchen EPINEPHrine 0.3 mg/0.3 mL IJ SOAJ injection Inject 0.3 mLs (0.3 mg total) into the muscle as directed. As needed for anaphylaxis 1 Device 1  . montelukast (SINGULAIR) 10 MG tablet Take 1 tablet (10 mg total) by mouth at bedtime. 90 tablet 3  . multivitamin (VIT W/EXTRA C) CHEW chewable tablet Chew 1 tablet by mouth daily.    Marland Kitchen omeprazole (PRILOSEC) 20 MG capsule Take 1 capsule (20 mg total) by mouth 2 (two) times daily before a meal. 60 capsule 3  . Spacer/Aero-Holding Chambers (AEROCHAMBER PLUS) inhaler  Use as instructed 1 each 2  . ALPRAZolam (XANAX) 0.25 MG tablet Take 1 tablet (0.25 mg total) by mouth 2 (two) times daily as needed for anxiety. (Patient not taking: Reported on 08/16/2017) 20 tablet 0   Current Facility-Administered Medications on File Prior to Visit  Medication Dose Route Frequency Provider Last Rate Last Dose  . 0.9 %  sodium chloride infusion  500 mL Intravenous Once Pyrtle, Lajuan Lines, MD         Past Medical History:  Diagnosis Date  . Allergy   . Anemia    with pregnancy   . Anxiety   .  Asthma    as child/ severe  . Gastric ulcer 2013  . Heart murmur   . Hypertension    no meds/ due to stress    Allergies  Allergen Reactions  . Bee Venom     Anaphylaxis  . Glutethimides Shortness Of Breath  . Penicillins Hives, Shortness Of Breath, Itching and Rash  . Shrimp [Shellfish Allergy] Anaphylaxis  . Latex     Itching on hands  . Soy Allergy     Causes diarrhea  . Pork-Derived Products Other (See Comments)    Head ache     Social History   Socioeconomic History  . Marital status: Legally Separated    Spouse name: Not on file  . Number of children: Not on file  . Years of education: Not on file  . Highest education level: Not on file  Occupational History  . Not on file  Social Needs  . Financial resource strain: Not on file  . Food insecurity:    Worry: Not on file    Inability: Not on file  . Transportation needs:    Medical: Not on file    Non-medical: Not on file  Tobacco Use  . Smoking status: Never Smoker  . Smokeless tobacco: Never Used  Substance and Sexual Activity  . Alcohol use: No  . Drug use: No  . Sexual activity: Not Currently  Lifestyle  . Physical activity:    Days per week: Not on file    Minutes per session: Not on file  . Stress: Not on file  Relationships  . Social connections:    Talks on phone: Not on file    Gets together: Not on file    Attends religious service: Not on file    Active member of club or organization: Not on file    Attends meetings of clubs or organizations: Not on file    Relationship status: Not on file  Other Topics Concern  . Not on file  Social History Narrative  . Not on file    Vitals:   08/31/17 1117  BP: (!) 160/100  Pulse: 85  Resp: 16  Temp: 97.7 F (36.5 C)  SpO2: 97%   Body mass index is 43.13 kg/m.    Physical Exam  Nursing note and vitals reviewed. Constitutional: She is oriented to person, place, and time. She appears well-developed. No distress.  HENT:  Head:  Normocephalic and atraumatic.  Mouth/Throat: Oropharynx is clear and moist and mucous membranes are normal.  Eyes: Pupils are equal, round, and reactive to light. Conjunctivae are normal.  Cardiovascular: Normal rate and regular rhythm.  No murmur heard. Pulses:      Dorsalis pedis pulses are 2+ on the right side, and 2+ on the left side.  Respiratory: Effort normal and breath sounds normal. No respiratory distress.  GI: Soft. She exhibits no  mass. There is no hepatomegaly. There is no tenderness.  Musculoskeletal: She exhibits no edema.  Lymphadenopathy:    She has no cervical adenopathy.  Neurological: She is alert and oriented to person, place, and time. She has normal strength. Coordination normal.  Skin: Skin is warm. No erythema.  Psychiatric: She has a normal mood and affect.  Well groomed, good eye contact.    ASSESSMENT AND PLAN:   [place] was seen today for hypertension.  Orders Placed This Encounter  Procedures  . Ambulatory referral to Cardiology  . EKG 12-Lead     Essential hypertension, benign Not well controlled. Possible complications of elevated BP discussed. She is reluctant to start pharmacologic treatment, she tolerated amlodipine well in the past. I strongly recommend starting amlodipine 5 mg.  Eye exam is current. I will see her back in 4 weeks, unless she can see cardiologist, in which case I will see her in 09/2017.    Abnormal EKG  EKG today: Sinus arrhythmia, T wave and ST abnormalities ant,lat,and inferior.voltage criteria for LVH, LAE. No significant changes when compared with EKG in 02/2016. Asymptomatic. Clearly instructed about warning signs.  -     Ambulatory referral to Cardiology      -Ms. [place] advised to return sooner than planned today if new concerns arise.     Daphna Lafuente G. [place], MD  [facility]. [place] office.

## 2017-08-31 NOTE — Telephone Encounter (Signed)
  Follow up Call-  Call Cidney Kirkwood number 08/30/2017  Post procedure Call Sundeep Destin phone  # [phone]  Permission to leave phone message Yes  Some recent data might be hidden     Patient questions:  Do you have a fever, pain , or abdominal swelling? No. Pain Score  0 *  Have you tolerated food without any problems? Yes.    Have you been able to return to your normal activities? Yes.    Do you have any questions about your discharge instructions: Diet   No. Medications  No. Follow up visit  No.  Do you have questions or concerns about your Care? No.  Actions: * If pain score is 4 or above: No action needed, pain <4.

## 2017-08-31 NOTE — Patient Instructions (Addendum)
A few things to remember from today's visit:   Essential hypertension, benign - Plan: EKG 12-Lead, amLODipine (NORVASC) 5 MG tablet  Abnormal EKG - Plan: Ambulatory referral to Cardiology   Hypertension Hypertension, commonly called high blood pressure, is when the force of blood pumping through the arteries is too strong. The arteries are the blood vessels that carry blood from the heart throughout the body. Hypertension forces the heart to work harder to pump blood and may cause arteries to become narrow or stiff. Having untreated or uncontrolled hypertension can cause heart attacks, strokes, kidney disease, and other problems. A blood pressure reading consists of a higher number over a lower number. Ideally, your blood pressure should be below 120/80. The first ("top") number is called the systolic pressure. It is a measure of the pressure in your arteries as your heart beats. The second ("bottom") number is called the diastolic pressure. It is a measure of the pressure in your arteries as the heart relaxes. What are the causes? The cause of this condition is not known. What increases the risk? Some risk factors for high blood pressure are under your control. Others are not. Factors you can change  Smoking.  Having type 2 diabetes mellitus, high cholesterol, or both.  Not getting enough exercise or physical activity.  Being overweight.  Having too much fat, sugar, calories, or salt (sodium) in your diet.  Drinking too much alcohol. Factors that are difficult or impossible to change  Having chronic kidney disease.  Having a family history of high blood pressure.  Age. Risk increases with age.  Race. You may be at higher risk if you are African-American.  Gender. Men are at higher risk than women before age 66. After age 44, women are at higher risk than men.  Having obstructive sleep apnea.  Stress. What are the signs or symptoms? Extremely high blood pressure  (hypertensive crisis) may cause:  Headache.  Anxiety.  Shortness of breath.  Nosebleed.  Nausea and vomiting.  Severe chest pain.  Jerky movements you cannot control (seizures).  How is this diagnosed? This condition is diagnosed by measuring your blood pressure while you are seated, with your arm resting on a surface. The cuff of the blood pressure monitor will be placed directly against the skin of your upper arm at the level of your heart. It should be measured at least twice using the same arm. Certain conditions can cause a difference in blood pressure between your right and left arms. Certain factors can cause blood pressure readings to be lower or higher than normal (elevated) for a short period of time:  When your blood pressure is higher when you are in a health care provider's office than when you are at home, this is called white coat hypertension. Most people with this condition do not need medicines.  When your blood pressure is higher at home than when you are in a health care provider's office, this is called masked hypertension. Most people with this condition may need medicines to control blood pressure.  If you have a high blood pressure reading during one visit or you have normal blood pressure with other risk factors:  You may be asked to return on a different day to have your blood pressure checked again.  You may be asked to monitor your blood pressure at home for 1 week or longer.  If you are diagnosed with hypertension, you may have other blood or imaging tests to help your health care provider  understand your overall risk for other conditions. How is this treated? This condition is treated by making healthy lifestyle changes, such as eating healthy foods, exercising more, and reducing your alcohol intake. Your health care provider may prescribe medicine if lifestyle changes are not enough to get your blood pressure under control, and if:  Your systolic blood  pressure is above 130.  Your diastolic blood pressure is above 80.  Your personal target blood pressure may vary depending on your medical conditions, your age, and other factors. Follow these instructions at home: Eating and drinking  Eat a diet that is high in fiber and potassium, and low in sodium, added sugar, and fat. An example eating plan is called the DASH (Dietary Approaches to Stop Hypertension) diet. To eat this way: ? Eat plenty of fresh fruits and vegetables. Try to fill half of your plate at each meal with fruits and vegetables. ? Eat whole grains, such as whole wheat pasta, brown rice, or whole grain bread. Fill about one quarter of your plate with whole grains. ? Eat or drink low-fat dairy products, such as skim milk or low-fat yogurt. ? Avoid fatty cuts of meat, processed or cured meats, and poultry with skin. Fill about one quarter of your plate with lean proteins, such as fish, chicken without skin, beans, eggs, and tofu. ? Avoid premade and processed foods. These tend to be higher in sodium, added sugar, and fat.  Reduce your daily sodium intake. Most people with hypertension should eat less than 1,500 mg of sodium a day.  Limit alcohol intake to no more than 1 drink a day for nonpregnant women and 2 drinks a day for men. One drink equals 12 oz of beer, 5 oz of wine, or 1 oz of hard liquor. Lifestyle  Work with your health care provider to maintain a healthy body weight or to lose weight. Ask what an ideal weight is for you.  Get at least 30 minutes of exercise that causes your heart to beat faster (aerobic exercise) most days of the week. Activities may include walking, swimming, or biking.  Include exercise to strengthen your muscles (resistance exercise), such as pilates or lifting weights, as part of your weekly exercise routine. Try to do these types of exercises for 30 minutes at least 3 days a week.  Do not use any products that contain nicotine or tobacco, such  as cigarettes and e-cigarettes. If you need help quitting, ask your health care provider.  Monitor your blood pressure at home as told by your health care provider.  Keep all follow-up visits as told by your health care provider. This is important. Medicines  Take over-the-counter and prescription medicines only as told by your health care provider. Follow directions carefully. Blood pressure medicines must be taken as prescribed.  Do not skip doses of blood pressure medicine. Doing this puts you at risk for problems and can make the medicine less effective.  Ask your health care provider about side effects or reactions to medicines that you should watch for. Contact a health care provider if:  You think you are having a reaction to a medicine you are taking.  You have headaches that keep coming back (recurring).  You feel dizzy.  You have swelling in your ankles.  You have trouble with your vision. Get help right away if:  You develop a severe headache or confusion.  You have unusual weakness or numbness.  You feel faint.  You have severe pain in your  chest or abdomen.  You vomit repeatedly.  You have trouble breathing. Summary  Hypertension is when the force of blood pumping through your arteries is too strong. If this condition is not controlled, it may put you at risk for serious complications.  Your personal target blood pressure may vary depending on your medical conditions, your age, and other factors. For most people, a normal blood pressure is less than 120/80.  Hypertension is treated with lifestyle changes, medicines, or a combination of both. Lifestyle changes include weight loss, eating a healthy, low-sodium diet, exercising more, and limiting alcohol. This information is not intended to replace advice given to you by your health care provider. Make sure you discuss any questions you have with your health care provider. Document Released: 05/15/2005 Document  Revised: 04/12/2016 Document Reviewed: 04/12/2016 Elsevier Interactive Patient Education  2018 [employer].  Please be sure medication list is accurate. If a new problem present, please set up appointment sooner than planned today.

## 2017-09-05 ENCOUNTER — Encounter: Payer: Self-pay | Admitting: Internal Medicine

## 2017-09-12 ENCOUNTER — Encounter: Payer: Self-pay | Admitting: Cardiology

## 2017-09-12 ENCOUNTER — Ambulatory Visit (INDEPENDENT_AMBULATORY_CARE_PROVIDER_SITE_OTHER): Payer: 59 | Admitting: Cardiology

## 2017-09-12 VITALS — BP 126/82 | HR 77 | Ht 64.0 in | Wt 242.2 lb

## 2017-09-12 DIAGNOSIS — I10 Essential (primary) hypertension: Secondary | ICD-10-CM

## 2017-09-12 DIAGNOSIS — I517 Cardiomegaly: Secondary | ICD-10-CM

## 2017-09-12 NOTE — Progress Notes (Signed)
Electrophysiology Office Note   Date:  09/12/2017   ID:  Alejandra Vega, DOB June 13, 1955, MRN [id number]  PCP:  [place], Betty G, MD  Cardiologist:   Primary Electrophysiologist:  Jlyn Cerros Meredith Leeds, MD    Chief Complaint  Patient presents with  . New Patient (Initial Visit)    Abnormal EKG     History of Present Illness: Alejandra Vega is a 62 y.o. female who is being seen today for the evaluation of EKG abnormalities at the request of [place], Malka So, MD. Presenting today for electrophysiology evaluation.  She has a history of hypertension not on medications.  She does have exertional dyspnea but this is been attributed to her previous asthma.  She had an EKG with her primary physician that showed diffuse T wave abnormalities and voltage criteria for LVH.  This was unchanged from 2017 and thus she was referred to cardiology.    Today, she denies symptoms of palpitations, chest pain, shortness of breath, orthopnea, PND, lower extremity edema, claudication, dizziness, presyncope, syncope, bleeding, or neurologic sequela. The patient is tolerating medications without difficulties.    Past Medical History:  Diagnosis Date  . Allergy   . Anemia    with pregnancy   . Anxiety   . Asthma    as child/ severe  . Gastric ulcer 2013  . Heart murmur   . Hypertension    no meds/ due to stress   Past Surgical History:  Procedure Laterality Date  . DILATION AND CURETTAGE OF UTERUS     2 times  . miscarriages     had 2  . PILONIDAL CYST EXCISION  1988   2 times  . WISDOM TOOTH EXTRACTION     age 16     Current Outpatient Medications  Medication Sig Dispense Refill  . acetaminophen (TYLENOL) 500 MG tablet Take 500 mg by mouth as needed.    Marland Kitchen albuterol (PROVENTIL HFA;VENTOLIN HFA) 108 (90 Base) MCG/ACT inhaler Inhale 2 puffs into the lungs every 6 (six) hours as needed for wheezing or shortness of breath.    Marland Kitchen albuterol (PROVENTIL) (2.5 MG/3ML) 0.083% nebulizer solution  Take 3 mLs (2.5 mg total) by nebulization every 8 (eight) hours as needed for wheezing or shortness of breath. 150 mL 1  . amLODipine (NORVASC) 5 MG tablet Take 1 tablet (5 mg total) by mouth daily. 90 tablet 0  . cetirizine (ZYRTEC) 10 MG tablet Take 10 mg by mouth daily.    . Cyanocobalamin (VITAMIN B 12 PO) Take by mouth daily.    . diphenhydrAMINE (BENADRYL) 25 MG tablet Take 25 mg by mouth as needed.    Marland Kitchen Spacer/Aero-Holding Chambers (AEROCHAMBER PLUS) inhaler Use as instructed 1 each 2   Current Facility-Administered Medications  Medication Dose Route Frequency Provider Last Rate Last Dose  . 0.9 %  sodium chloride infusion  500 mL Intravenous Once Pyrtle, Lajuan Lines, MD        Allergies:   Bee venom; Glutethimides; Penicillins; Shrimp [shellfish allergy]; Latex; Soy allergy; and Pork-derived products   Social History:  The patient  reports that she has never smoked. She has never used smokeless tobacco. She reports that she does not drink alcohol or use drugs.   Family History:  The patient's family history includes Alcohol abuse in her father; Breast cancer in her maternal aunt; Heart disease in her father; Hypertension in her mother.    ROS:  Please see the history of present illness.   Otherwise, review of  systems is positive for cough, dyspnea on exertion, bloody stools, dizziness, headaches.   All other systems are reviewed and negative.    PHYSICAL EXAM: VS:  BP 126/82   Pulse 77   Ht 5\' 4"  (1.626 m)   Wt 242 lb 3.2 oz (109.9 kg)   SpO2 98%   BMI 41.57 kg/m  , BMI Body mass index is 41.57 kg/m. GEN: Well nourished, well developed, in no acute distress  HEENT: normal  Neck: no JVD, carotid bruits, or masses Cardiac: RRR; no murmurs, rubs, or gallops,no edema  Respiratory:  clear to auscultation bilaterally, normal work of breathing GI: soft, nontender, nondistended, + BS MS: no deformity or atrophy  Skin: warm and dry Neuro:  Strength and sensation are intact Psych:  euthymic mood, full affect  EKG:  EKG is ordered today. Personal review of the ekg ordered shows sinus rhythm, left atrial enlargement, LVH with repolarization abnormalities  Recent Labs: 03/01/2017: BUN 16; Creatinine, Ser 0.89; Potassium 3.8; Sodium 139    Lipid Panel     Component Value Date/Time   CHOL 235 (H) 03/01/2017 0801   TRIG 99.0 03/01/2017 0801   HDL 55.40 03/01/2017 0801   CHOLHDL 4 03/01/2017 0801   VLDL 19.8 03/01/2017 0801   LDLCALC 160 (H) 03/01/2017 0801     Wt Readings from Last 3 Encounters:  09/12/17 242 lb 3.2 oz (109.9 kg)  08/31/17 243 lb 8 oz (110.5 kg)  08/30/17 244 lb (110.7 kg)      Other studies Reviewed: Additional studies/ records that were reviewed today include: TTE 03/22/17  Review of the above records today demonstrates:  - Left ventricle: The cavity size was mildly dilated. Wall   thickness was increased in a pattern of moderate LVH. Systolic   function was normal. The estimated ejection fraction was in the   range of 55% to 60%. Wall motion was normal; there were no   regional wall motion abnormalities. Doppler parameters are   consistent with both elevated ventricular end-diastolic filling   pressure and elevated left atrial filling pressure. - Aortic valve: There was trivial regurgitation. - Left atrium: The atrium was moderately dilated. - Atrial septum: No defect or patent foramen ovale was identified.   ASSESSMENT AND PLAN:  1.  LVH: EKG shows LVH with repolarization abnormalities.  Her echocardiogram also shows moderate LVH.  She does have a history of hypertension which is well-controlled today.  She does have elevated left heart pressures.  She would benefit from aggressive blood pressure control.  She is currently on Norvasc which has got her pressure under much better control than it was previously.  Would continue this medication.  2. Hypertension: Pressure well controlled today.  We Teresita Fanton continue Norvasc.    Current  medicines are reviewed at length with the patient today.   The patient does not have concerns regarding her medicines.  The following changes were made today:  none  Labs/ tests ordered today include:  Orders Placed This Encounter  Procedures  . EKG 12-Lead     Disposition:   FU with general cardiology 1 year  Signed, Navin Dogan Meredith Leeds, MD  09/12/2017 9:36 AM     [facility] [address] [place] [address] [phone] (office) [phone] (fax)

## 2017-09-12 NOTE — Patient Instructions (Signed)
Medication Instructions:  Your physician recommends that you continue on your current medications as directed. Please refer to the Current Medication list given to you today.  Labwork: None ordered  Testing/Procedures: None ordered  Follow-Up: Your physician wants you to follow-up in: 1 year with an extender (physicians assistant or nurse practitioner).  You will receive a reminder letter in the mail two months in advance. If you don't receive a letter, please call our office to schedule the follow-up appointment.  * If you need a refill on your cardiac medications before your next appointment, please call your pharmacy.   *Please note that any paperwork needing to be filled out by the provider will need to be addressed at the front desk prior to seeing the provider. Please note that any FMLA, disability or other documents regarding health condition is subject to a $25.00 charge that must be received prior to completion of paperwork in the form of a money order or check.  Thank you for choosing [facility]!!   Trinidad Curet, RN [phone]

## 2017-09-13 IMAGING — DX DG CHEST 2V
2 series · 2 of 2 positions shown · non-contrast
Comparison: 12/24/2011.

CLINICAL DATA: Shortness of breath.

EXAM:
CHEST  2 VIEW

[chest pa]
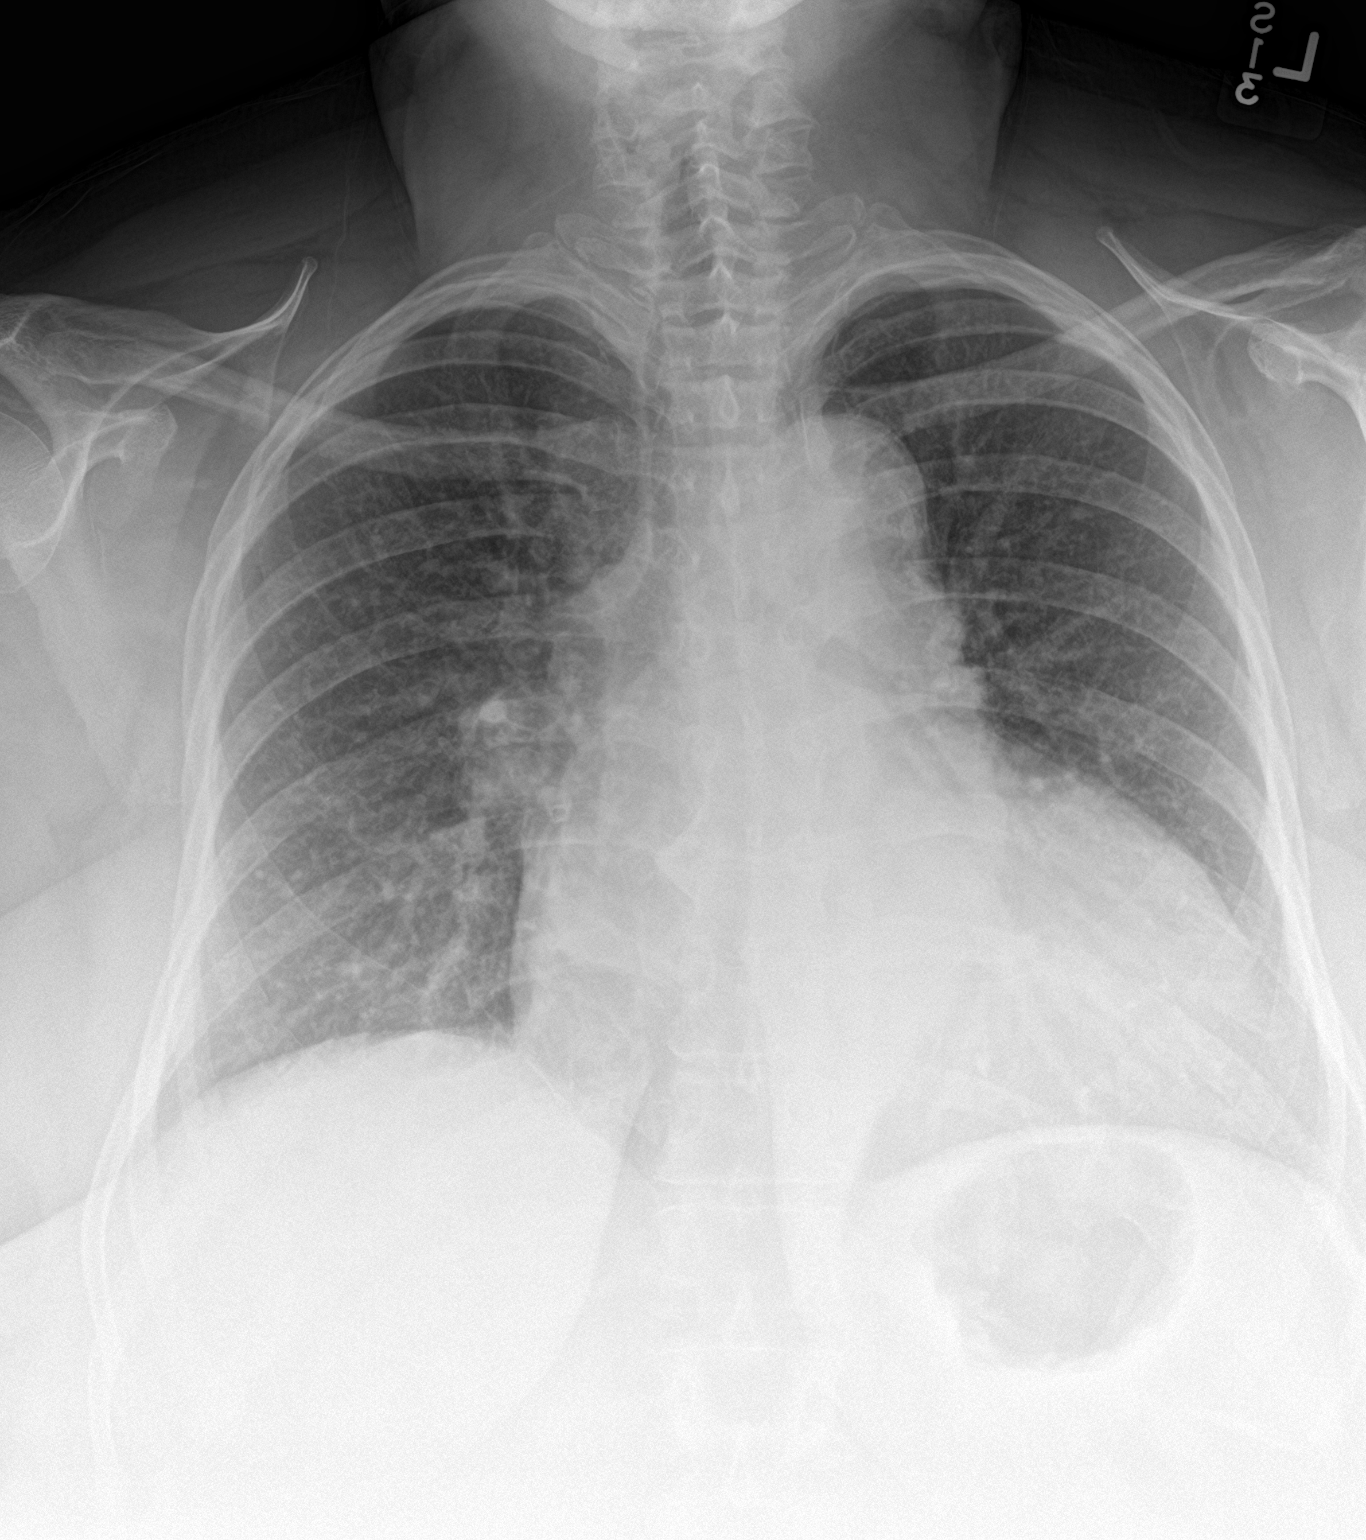

[chest lat]
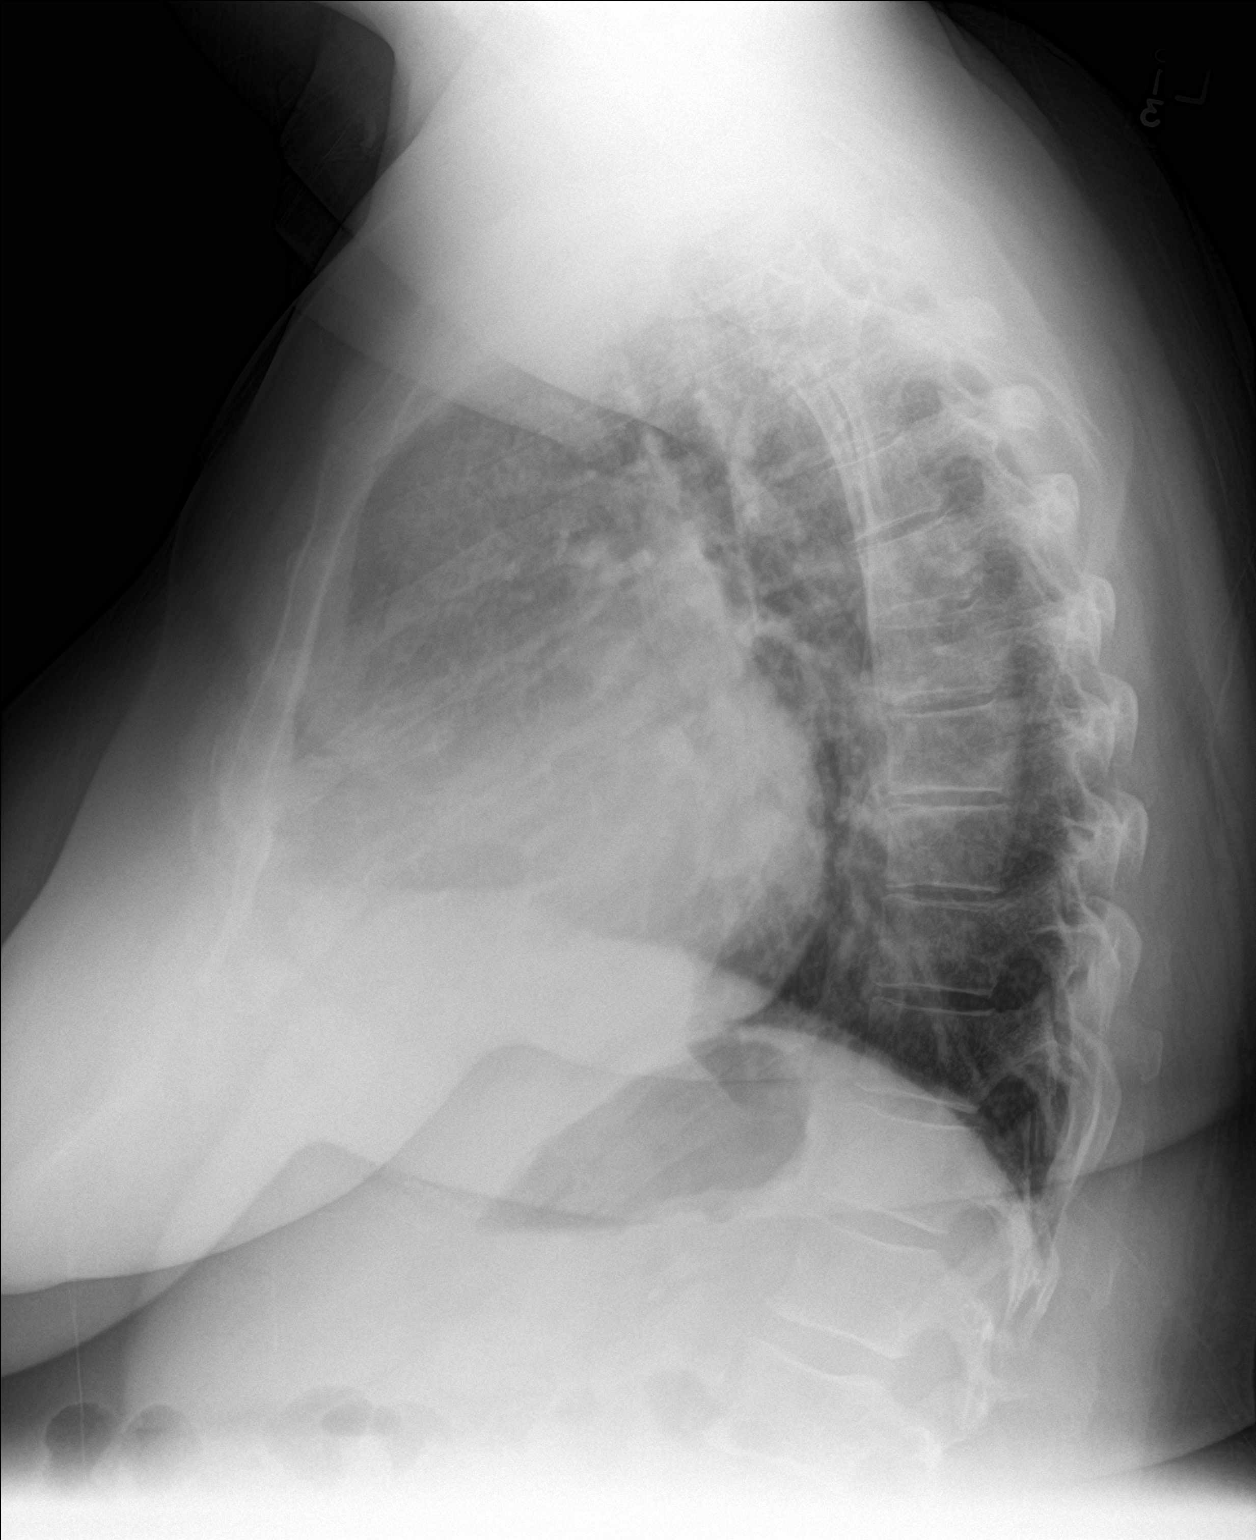

[2 of 2 positions shown; findings below may reference images not displayed]

FINDINGS: Cardiomegaly with pulmonary vascular prominence. No focal
infiltrate. No pleural effusion or pneumothorax. Degenerative
changes thoracic spine.
IMPRESSION: Cardiomegaly with pulmonary vascular congestion. No evidence of
overt congestive heart failure.

## 2017-09-20 ENCOUNTER — Ambulatory Visit: Payer: Self-pay | Admitting: Allergy

## 2017-10-01 ENCOUNTER — Ambulatory Visit: Payer: 59 | Admitting: Family Medicine

## 2017-10-01 NOTE — Progress Notes (Deleted)
HPI:   Alejandra Vega is a 62 y.o. female, who is here today for 4 months follow up.   She was last seen on 08/28/17 for acute visit,exertional dyspnea due to asthma and HTN.  She followed with cardiologist due to abnormal EKG + exertional dyspnea, 09/12/17.  Currently she is on Amlodipine 5 mg daily.   {4+ HPI elements (or status of 3 or more chronic diseases)} ***      Review of Systems  Constitutional: Positive for fatigue. Negative for activity change, appetite change and fever.  HENT: Negative for mouth sores, nosebleeds and trouble swallowing.   Eyes: Negative for redness and visual disturbance.  Respiratory: Negative for cough, shortness of breath and wheezing.   Cardiovascular: Negative for chest pain, palpitations and leg swelling.  Gastrointestinal: Negative for abdominal pain, nausea and vomiting.       Negative for changes in bowel habits.  Genitourinary: Negative for decreased urine volume and hematuria.  Neurological: Negative for syncope, weakness and headaches.    Dorothy Spark of 2 to 9 systems] ***  Current Outpatient Medications on File Prior to Visit  Medication Sig Dispense Refill  . acetaminophen (TYLENOL) 500 MG tablet Take 500 mg by mouth as needed.    Marland Kitchen albuterol (PROVENTIL HFA;VENTOLIN HFA) 108 (90 Base) MCG/ACT inhaler Inhale 2 puffs into the lungs every 6 (six) hours as needed for wheezing or shortness of breath.    Marland Kitchen albuterol (PROVENTIL) (2.5 MG/3ML) 0.083% nebulizer solution Take 3 mLs (2.5 mg total) by nebulization every 8 (eight) hours as needed for wheezing or shortness of breath. 150 mL 1  . amLODipine (NORVASC) 5 MG tablet Take 1 tablet (5 mg total) by mouth daily. 90 tablet 0  . cetirizine (ZYRTEC) 10 MG tablet Take 10 mg by mouth daily.    . Cyanocobalamin (VITAMIN B 12 PO) Take by mouth daily.    . diphenhydrAMINE (BENADRYL) 25 MG tablet Take 25 mg by mouth as needed.    Marland Kitchen Spacer/Aero-Holding Chambers (AEROCHAMBER PLUS) inhaler  Use as instructed 1 each 2   Current Facility-Administered Medications on File Prior to Visit  Medication Dose Route Frequency Provider Last Rate Last Dose  . 0.9 %  sodium chloride infusion  500 mL Intravenous Once Pyrtle, Lajuan Lines, MD         Past Medical History:  Diagnosis Date  . Allergy   . Anemia    with pregnancy   . Anxiety   . Asthma    as child/ severe  . Gastric ulcer 2013  . Heart murmur   . Hypertension    no meds/ due to stress   Allergies  Allergen Reactions  . Bee Venom     Anaphylaxis  . Glutethimides Shortness Of Breath  . Penicillins Hives, Shortness Of Breath, Itching and Rash  . Shrimp [Shellfish Allergy] Anaphylaxis  . Latex     Itching on hands  . Soy Allergy     Causes diarrhea  . Pork-Derived Products Other (See Comments)    Head ache     Social History   Socioeconomic History  . Marital status: Legally Separated    Spouse name: Not on file  . Number of children: Not on file  . Years of education: Not on file  . Highest education level: Not on file  Occupational History  . Not on file  Social Needs  . Financial resource strain: Not on file  . Food insecurity:    Worry: Not on  file    Inability: Not on file  . Transportation needs:    Medical: Not on file    Non-medical: Not on file  Tobacco Use  . Smoking status: Never Smoker  . Smokeless tobacco: Never Used  Substance and Sexual Activity  . Alcohol use: No  . Drug use: No  . Sexual activity: Not Currently  Lifestyle  . Physical activity:    Days per week: Not on file    Minutes per session: Not on file  . Stress: Not on file  Relationships  . Social connections:    Talks on phone: Not on file    Gets together: Not on file    Attends religious service: Not on file    Active member of club or organization: Not on file    Attends meetings of clubs or organizations: Not on file    Relationship status: Not on file  Other Topics Concern  . Not on file  Social History  Narrative  . Not on file    There were no vitals filed for this visit. There is no height or weight on file to calculate BMI.    Physical Exam  Constitutional: She is oriented to person, place, and time. She appears well-developed. No distress.  HENT:  Head: Normocephalic and atraumatic.  Mouth/Throat: Oropharynx is clear and moist and mucous membranes are normal.  Eyes: Pupils are equal, round, and reactive to light. Conjunctivae and EOM are normal.  Cardiovascular: Normal rate and regular rhythm.  No murmur heard. Pulses:      Dorsalis pedis pulses are 2+ on the right side, and 2+ on the left side.  Respiratory: Effort normal and breath sounds normal. No respiratory distress.  GI: Soft. She exhibits no mass. There is no hepatomegaly. There is no tenderness.  Musculoskeletal: She exhibits no edema.  Lymphadenopathy:    She has no cervical adenopathy.  Neurological: She is alert and oriented to person, place, and time. She has normal strength. Coordination normal.  Skin: Skin is warm. No erythema.  Psychiatric: She has a normal mood and affect.  Well groomed, good eye contact.    {[12+ exam elements]} ***  ASSESSMENT AND PLAN:   Alejandra Vega was seen today for 4 months follow-up.  No orders of the defined types were placed in this encounter.   No problem-specific Assessment & Plan notes found for this encounter.           -[name] was advised to return sooner than planned today if new concerns arise.       Betty G. Martinique, MD  [facility]. [place] office.

## 2017-10-02 ENCOUNTER — Ambulatory Visit: Payer: 59 | Admitting: Family Medicine

## 2017-10-02 DIAGNOSIS — Z0289 Encounter for other administrative examinations: Secondary | ICD-10-CM

## 2017-10-16 ENCOUNTER — Ambulatory Visit: Payer: 59 | Admitting: Family Medicine

## 2017-10-18 ENCOUNTER — Encounter: Payer: Self-pay | Admitting: Family Medicine

## 2017-10-18 ENCOUNTER — Encounter: Payer: Self-pay | Admitting: *Deleted

## 2017-10-18 ENCOUNTER — Ambulatory Visit (INDEPENDENT_AMBULATORY_CARE_PROVIDER_SITE_OTHER): Payer: 59 | Admitting: Family Medicine

## 2017-10-18 VITALS — BP 125/84 | HR 63 | Temp 97.8°F | Resp 12 | Ht 64.0 in | Wt 247.2 lb

## 2017-10-18 DIAGNOSIS — H8110 Benign paroxysmal vertigo, unspecified ear: Secondary | ICD-10-CM | POA: Diagnosis not present

## 2017-10-18 DIAGNOSIS — K219 Gastro-esophageal reflux disease without esophagitis: Secondary | ICD-10-CM | POA: Diagnosis not present

## 2017-10-18 DIAGNOSIS — R5383 Other fatigue: Secondary | ICD-10-CM | POA: Diagnosis not present

## 2017-10-18 DIAGNOSIS — I10 Essential (primary) hypertension: Secondary | ICD-10-CM

## 2017-10-18 DIAGNOSIS — R197 Diarrhea, unspecified: Secondary | ICD-10-CM | POA: Diagnosis not present

## 2017-10-18 LAB — CBC
HCT: 38.7 % (ref 36.0–46.0)
HEMOGLOBIN: 12.7 g/dL (ref 12.0–15.0)
MCHC: 32.9 g/dL (ref 30.0–36.0)
MCV: 81.5 fl (ref 78.0–100.0)
Platelets: 331 10*3/uL (ref 150.0–400.0)
RBC: 4.75 Mil/uL (ref 3.87–5.11)
RDW: 17.4 % — AB (ref 11.5–15.5)
WBC: 6 10*3/uL (ref 4.0–10.5)

## 2017-10-18 LAB — BASIC METABOLIC PANEL
BUN: 12 mg/dL (ref 6–23)
CO2: 31 meq/L (ref 19–32)
Calcium: 10 mg/dL (ref 8.4–10.5)
Chloride: 101 mEq/L (ref 96–112)
Creatinine, Ser: 0.81 mg/dL (ref 0.40–1.20)
GFR: 91.93 mL/min (ref >60.00)
GLUCOSE: 95 mg/dL (ref 70–99)
POTASSIUM: 4.5 meq/L (ref 3.5–5.1)
Sodium: 139 mEq/L (ref 135–145)

## 2017-10-18 MED ORDER — MECLIZINE HCL 25 MG PO TABS: 25.0000 mg | ORAL_TABLET | Freq: Three times a day (TID) | ORAL | 0 refills | Status: DC | PRN

## 2017-10-18 NOTE — Patient Instructions (Addendum)
A few things to remember from today's visit:   Benign paroxysmal positional vertigo, unspecified laterality - Plan: meclizine (ANTIVERT) 25 MG tablet  Fatigue, unspecified type - Plan: Basic metabolic panel, CBC   Dizziness is a perception of movement, it is sometimes difficult to describe and can be  caused by different problems, most benign but others can be life threaten.  Vertigo is the most common cause of dizziness, usually related with inner ear and can be associated with nausea, vomiting, and unbalance sensation. It can be complicated by falls due to lose of balance; so fall precautions are very important.  Most of the time dizziness is benign, usually intermittent, last a few seconds at the time and aggravated by certain positions. It usually resolves in a few weeks without residual effect but it could be recurrent.  Sometimes blood work is ordered to evaluate for other possible causes.  Dizziness can also be caused by certain medications, dehydration, migraines, and strokes.  Medication prescribed for vertigo, Meclizine, causes drowsiness/sleepiness, so frequently I recommended taking it at bedtime. I also recommend what we called vestibular exercise, sometimes can be done at home (Modified Semont maneuvers) other times I refer patients to vestibular rehabilitation.   Seek immediate medical attention if: New severe headache, dobble vision, fever (100 F or more), associated numbness/tingling, focal weakness, persistent vomiting, not able to walk, or sudden worsening symptoms.  Please be sure medication list is accurate. If a new problem present, please set up appointment sooner than planned today.

## 2017-10-18 NOTE — Progress Notes (Signed)
ACUTE VISIT   HPI:  Chief Complaint  Patient presents with  . Dizziness    possible allergies, takes benadryl and it helps  . Upset Stomach  . Sleep issues    Alejandra Vega is a 62 y.o. female, who is here today complaining of intermittent dizziness and sleepy since she started antihypertensive medication,Amlodipine. "Bad taste" in her mouth. Spinning sensation when lying down and upon turning in bed. No chest pain or diaphoresis.  No hearing loss or tinnitus. + Ear fullness sensation, like she is in a "funnel ."  Improving.  Feeling very sleepy, afraid of driving. It is intermittent. She usually feels rested and "fine" when she wakes up,symptoms start late afternoon.  She wonders if symptoms are due to "bad allergies." Nasal congestion No known Hx of OSA, in 2012 , choking /waking up herself, attributed to medications at that time. She will get up , drink water and "relax."   She has checked BP once and it was 146/81,she think it was mildly elevated because she was "fighting to stay awake."  She recently had colonoscopy,still having diarrhea once per week,slowly getting better. No vomiting. Mild nausea and "indigestion." No dyspnea or palpitations. Hx of GERD.  Recently evaluated by cardiologist.    Review of Systems  Constitutional: Positive for fatigue. Negative for appetite change and fever.  HENT: Negative for mouth sores, nosebleeds and trouble swallowing.   Eyes: Negative for redness and visual disturbance.  Respiratory: Positive for cough. Negative for shortness of breath and wheezing.   Cardiovascular: Negative for chest pain, palpitations and leg swelling.  Gastrointestinal: Positive for nausea. Negative for abdominal pain and vomiting.       Negative for changes in bowel habits.  Endocrine: Negative for cold intolerance and heat intolerance.  Genitourinary: Negative for decreased urine volume, dysuria and hematuria.  Skin: Negative for  pallor and rash.  Allergic/Immunologic: Positive for environmental allergies.  Neurological: Positive for dizziness. Negative for syncope, weakness, numbness and headaches.  Psychiatric/Behavioral: Negative for confusion. The patient is nervous/anxious.       Current Outpatient Medications on File Prior to Visit  Medication Sig Dispense Refill  . acetaminophen (TYLENOL) 500 MG tablet Take 500 mg by mouth as needed.    Marland Kitchen albuterol (PROVENTIL HFA;VENTOLIN HFA) 108 (90 Base) MCG/ACT inhaler Inhale 2 puffs into the lungs every 6 (six) hours as needed for wheezing or shortness of breath.    Marland Kitchen albuterol (PROVENTIL) (2.5 MG/3ML) 0.083% nebulizer solution Take 3 mLs (2.5 mg total) by nebulization every 8 (eight) hours as needed for wheezing or shortness of breath. 150 mL 1  . amLODipine (NORVASC) 5 MG tablet Take 1 tablet (5 mg total) by mouth daily. 90 tablet 0  . cetirizine (ZYRTEC) 10 MG tablet Take 10 mg by mouth daily.    . Cyanocobalamin (VITAMIN B 12 PO) Take by mouth daily.    . diphenhydrAMINE (BENADRYL) 25 MG tablet Take 25 mg by mouth as needed.    Marland Kitchen Spacer/Aero-Holding Chambers (AEROCHAMBER PLUS) inhaler Use as instructed 1 each 2   Current Facility-Administered Medications on File Prior to Visit  Medication Dose Route Frequency Provider Last Rate Last Dose  . 0.9 %  sodium chloride infusion  500 mL Intravenous Once Pyrtle, Lajuan Lines, MD         Past Medical History:  Diagnosis Date  . Allergy   . Anemia    with pregnancy   . Anxiety   . Asthma  as child/ severe  . Gastric ulcer 2013  . Heart murmur   . Hypertension    no meds/ due to stress   Allergies  Allergen Reactions  . Bee Venom     Anaphylaxis  . Glutethimides Shortness Of Breath  . Penicillins Hives, Shortness Of Breath, Itching and Rash  . Shrimp [Shellfish Allergy] Anaphylaxis  . Latex     Itching on hands  . Soy Allergy     Causes diarrhea  . Pork-Derived Products Other (See Comments)    Head ache      Social History   Socioeconomic History  . Marital status: Legally Separated    Spouse name: Not on file  . Number of children: Not on file  . Years of education: Not on file  . Highest education level: Not on file  Occupational History  . Not on file  Social Needs  . Financial resource strain: Not on file  . Food insecurity:    Worry: Not on file    Inability: Not on file  . Transportation needs:    Medical: Not on file    Non-medical: Not on file  Tobacco Use  . Smoking status: Never Smoker  . Smokeless tobacco: Never Used  Substance and Sexual Activity  . Alcohol use: No  . Drug use: No  . Sexual activity: Not Currently  Lifestyle  . Physical activity:    Days per week: Not on file    Minutes per session: Not on file  . Stress: Not on file  Relationships  . Social connections:    Talks on phone: Not on file    Gets together: Not on file    Attends religious service: Not on file    Active member of club or organization: Not on file    Attends meetings of clubs or organizations: Not on file    Relationship status: Not on file  Other Topics Concern  . Not on file  Social History Narrative  . Not on file    Vitals:   10/18/17 0829  BP: 125/84  Pulse: 63  Resp: 12  Temp: 97.8 F (36.6 C)  SpO2: 98%   Body mass index is 42.44 kg/m.    Physical Exam  Nursing note and vitals reviewed. Constitutional: She is oriented to person, place, and time. She appears well-developed. No distress.  HENT:  Head: Normocephalic and atraumatic.  Right Ear: Tympanic membrane, external ear and ear canal normal.  Left Ear: Tympanic membrane, external ear and ear canal normal.  Mouth/Throat: Oropharynx is clear and moist and mucous membranes are normal.  Cerumen excess, TM's partially seen.  Eyes: Pupils are equal, round, and reactive to light. Conjunctivae and EOM are normal.  Neck: Carotid bruit is not present.  Cardiovascular: Normal rate and regular rhythm.  No  murmur heard. Pulses:      Dorsalis pedis pulses are 2+ on the right side, and 2+ on the left side.  Respiratory: Effort normal and breath sounds normal. No respiratory distress.  GI: Soft. She exhibits no mass. There is no hepatomegaly. There is no tenderness.  Musculoskeletal: She exhibits no edema.  Lymphadenopathy:    She has no cervical adenopathy.  Neurological: She is alert and oriented to person, place, and time. She has normal strength. No cranial nerve deficit. She displays a negative Romberg sign. Coordination and gait normal.  Pronator drift negative.  Skin: Skin is warm. No erythema.  Psychiatric: Her mood appears anxious.  Well groomed, good eye  contact.    ASSESSMENT AND PLAN:  Alejandra Vega was seen today for dizziness, upset stomach and sleep issues.  Diagnoses and all orders for this visit:  Lab Results  Component Value Date   CREATININE 0.81 10/18/2017   BUN 12 10/18/2017   NA 139 10/18/2017   K 4.5 10/18/2017   CL 101 10/18/2017   CO2 31 10/18/2017   Lab Results  Component Value Date   WBC 6.0 10/18/2017   HGB 12.7 10/18/2017   HCT 38.7 10/18/2017   MCV 81.5 10/18/2017   PLT 331.0 10/18/2017    Fatigue, unspecified type  Possible etiologies discussed. ? Medication side effects. ? Allergies. We may need to consider sleep study if it is persistent. Further recommendations will be given according to lab results.  -     Basic metabolic panel -     CBC  Benign paroxysmal positional vertigo, unspecified laterality  We discussed other possible etiologies of dizziness.  Explained that problem can be recurrent. Fall prevention. Vestibular exercises recommended, handout with Semont maneuvers given. Meclizine 25 mg tid prn, some side effects discussed. Instructed about warning signs. F/U as needed.  -     meclizine (ANTIVERT) 25 MG tablet; Take 1 tablet (25 mg total) by mouth 3 (three) times daily as needed for dizziness.  Essential hypertension,  benign  We discussed other pharmacologic options, since she is feeling better she wants to continue Amlodipine for now. Monitor BP at home. Instructed about warning signs.  Diarrhea, unspecified type  Adequate hydration. Improving.  Gastroesophageal reflux disease, esophagitis presence not specified  Could be contributing to nausea. PPI could aggravate diarrhea. For now GERD precautions recommended.     Return in about 1 month (around 11/15/2017) for dizziness.      Amika Tassin G. Martinique, MD  [facility]. [place] office.

## 2017-11-21 ENCOUNTER — Ambulatory Visit (INDEPENDENT_AMBULATORY_CARE_PROVIDER_SITE_OTHER): Payer: 59 | Admitting: Family Medicine

## 2017-11-21 ENCOUNTER — Encounter: Payer: Self-pay | Admitting: Family Medicine

## 2017-11-21 VITALS — BP 140/80 | HR 78 | Temp 97.9°F | Resp 16 | Ht 64.0 in | Wt 251.5 lb

## 2017-11-21 DIAGNOSIS — J309 Allergic rhinitis, unspecified: Secondary | ICD-10-CM

## 2017-11-21 DIAGNOSIS — J4520 Mild intermittent asthma, uncomplicated: Secondary | ICD-10-CM

## 2017-11-21 DIAGNOSIS — H8110 Benign paroxysmal vertigo, unspecified ear: Secondary | ICD-10-CM

## 2017-11-21 DIAGNOSIS — I10 Essential (primary) hypertension: Secondary | ICD-10-CM | POA: Diagnosis not present

## 2017-11-21 DIAGNOSIS — IMO0001 Reserved for inherently not codable concepts without codable children: Secondary | ICD-10-CM

## 2017-11-21 MED ORDER — FLUTICASONE PROPIONATE 50 MCG/ACT NA SUSP: 1.0000 | Freq: Two times a day (BID) | NASAL | 3 refills | Status: DC

## 2017-11-21 NOTE — Assessment & Plan Note (Signed)
Better controlled. No changes in current management. She cannot go to immunologist for now.

## 2017-11-21 NOTE — Progress Notes (Signed)
HPI:   Alejandra Vega is a 62 y.o. female, who is here today for 1-2 months follow up.   She was last seen on 10/18/17 for dizziness. Meclizine was recommended. She states that dizziness resolved after she discontinued Amlodipine.  She was also having finger tips numbness and metallic taste in mouth while she was on Amlodipine,which also improved after discontinuation of medication.   Hypertension:   She discontinued Amlodipine because it was causing dizziness, fingertips numbness,and metal taste. BP reading: 140's/70-80's.   She has not noted unusual headache, visual changes, exertional chest pain, focal weakness, or edema.   Lab Results  Component Value Date   CREATININE 0.81 10/18/2017   BUN 12 10/18/2017   NA 139 10/18/2017   K 4.5 10/18/2017   CL 101 10/18/2017   CO2 31 10/18/2017    Asthma:She has not needed Albuterol inh sine her last OV.  Nasal congestion,which she attributes to dairy products. "A little" SOB today,yesterday her grass was cut around her house. + Wheezing. Mild non productive cough.  No fever,chills,or body aches.  She is still taking Zyrtec. She is not taking Singulair.  She missed appt with immunologist due to cost.      She is trying to focus on wt loss. She is doing stretching, can not walk outdoors because allergies. She is eating more "plant base" diet, more vegetables and fruit. Stopped sodas and juices.  She is planning on starting replacing meals for smoothies.  Decreased fat intake.     Review of Systems  Constitutional: Negative for activity change, appetite change, fatigue and fever.  HENT: Positive for congestion. Negative for mouth sores, nosebleeds and trouble swallowing.   Eyes: Negative for redness and visual disturbance.  Respiratory: Positive for cough, shortness of breath and wheezing.   Cardiovascular: Negative for chest pain, palpitations and leg swelling.  Gastrointestinal: Negative for  abdominal pain, nausea and vomiting.       Negative for changes in bowel habits.  Genitourinary: Negative for decreased urine volume, dysuria and hematuria.  Allergic/Immunologic: Positive for environmental allergies.  Neurological: Negative for syncope, weakness and headaches.  Psychiatric/Behavioral: Negative for confusion and sleep disturbance. The patient is nervous/anxious.      Current Outpatient Medications on File Prior to Visit  Medication Sig Dispense Refill  . acetaminophen (TYLENOL) 500 MG tablet Take 500 mg by mouth as needed.    Marland Kitchen albuterol (PROVENTIL HFA;VENTOLIN HFA) 108 (90 Base) MCG/ACT inhaler Inhale 2 puffs into the lungs every 6 (six) hours as needed for wheezing or shortness of breath.    Marland Kitchen albuterol (PROVENTIL) (2.5 MG/3ML) 0.083% nebulizer solution Take 3 mLs (2.5 mg total) by nebulization every 8 (eight) hours as needed for wheezing or shortness of breath. 150 mL 1  . cetirizine (ZYRTEC) 10 MG tablet Take 10 mg by mouth daily.    . Cyanocobalamin (VITAMIN B 12 PO) Take by mouth daily.    . diphenhydrAMINE (BENADRYL) 25 MG tablet Take 25 mg by mouth as needed.    . meclizine (ANTIVERT) 25 MG tablet Take 1 tablet (25 mg total) by mouth 3 (three) times daily as needed for dizziness. 45 tablet 0  . Spacer/Aero-Holding Chambers (AEROCHAMBER PLUS) inhaler Use as instructed 1 each 2   Current Facility-Administered Medications on File Prior to Visit  Medication Dose Route Frequency Provider Last Rate Last Dose  . 0.9 %  sodium chloride infusion  500 mL Intravenous Once Pyrtle, Lajuan Lines, MD  Past Medical History:  Diagnosis Date  . Allergy   . Anemia    with pregnancy   . Anxiety   . Asthma    as child/ severe  . Gastric ulcer 2013  . Heart murmur   . Hypertension    no meds/ due to stress   Allergies  Allergen Reactions  . Bee Venom     Anaphylaxis  . Glutethimides Shortness Of Breath  . Penicillins Hives, Shortness Of Breath, Itching and Rash  .  Shrimp [Shellfish Allergy] Anaphylaxis  . Latex     Itching on hands  . Soy Allergy     Causes diarrhea  . Pork-Derived Products Other (See Comments)    Head ache     Social History   Socioeconomic History  . Marital status: Legally Separated    Spouse name: Not on file  . Number of children: Not on file  . Years of education: Not on file  . Highest education level: Not on file  Occupational History  . Not on file  Social Needs  . Financial resource strain: Not on file  . Food insecurity:    Worry: Not on file    Inability: Not on file  . Transportation needs:    Medical: Not on file    Non-medical: Not on file  Tobacco Use  . Smoking status: Never Smoker  . Smokeless tobacco: Never Used  Substance and Sexual Activity  . Alcohol use: No  . Drug use: No  . Sexual activity: Not Currently  Lifestyle  . Physical activity:    Days per week: Not on file    Minutes per session: Not on file  . Stress: Not on file  Relationships  . Social connections:    Talks on phone: Not on file    Gets together: Not on file    Attends religious service: Not on file    Active member of club or organization: Not on file    Attends meetings of clubs or organizations: Not on file    Relationship status: Not on file  Other Topics Concern  . Not on file  Social History Narrative  . Not on file    Vitals:   11/21/17 0835  BP: 140/80  Pulse: 78  Resp: 16  Temp: 97.9 F (36.6 C)  SpO2: 98%   Body mass index is 43.17 kg/m.   Wt Readings from Last 3 Encounters:  11/21/17 251 lb 8 oz (114.1 kg)  10/18/17 247 lb 4 oz (112.2 kg)  09/12/17 242 lb 3.2 oz (109.9 kg)      Physical Exam  Nursing note and vitals reviewed. Constitutional: She is oriented to person, place, and time. She appears well-developed. No distress.  HENT:  Head: Normocephalic and atraumatic.  Mouth/Throat: Oropharynx is clear and moist and mucous membranes are normal.  She had mild spinning sensation when  lying on table and sitting up.  Eyes: Pupils are equal, round, and reactive to light. Conjunctivae are normal.  Cardiovascular: Normal rate and regular rhythm.  No murmur heard. Pulses:      Dorsalis pedis pulses are 2+ on the right side, and 2+ on the left side.  Respiratory: Effort normal and breath sounds normal. No respiratory distress.  GI: Soft. She exhibits no mass. There is no hepatomegaly. There is no tenderness.  Musculoskeletal: She exhibits no edema.  Lymphadenopathy:    She has no cervical adenopathy.  Neurological: She is alert and oriented to person, place, and time. She  has normal strength. Gait normal.  Skin: Skin is warm. No rash noted. No erythema.  Psychiatric: Her mood appears anxious.  Well groomed, good eye contact.       ASSESSMENT AND PLAN:   Ms. ALYZA ARTIAGA was seen today for 1-2 months follow-up.  No orders of the defined types were placed in this encounter.   Essential hypertension, benign Not well controlled. Possible complications of elevated BP discussed. She is not interested in antihypertensive medication,afraid of side effects. Annual eye examination. F/U in 3-4 months.   Moderate intermittent asthma Better controlled. No changes in current management. She cannot go to immunologist for now.   Allergic rhinitis Continue Zyrtec 10 mg daily. I recommend Flonase nasal spray, she is reluctant to use. Saline irrigations as needed.     Obesity, Class III, BMI 40-49.9 (morbid obesity) ([place])  We discussed benefits of wt loss as well as adverse effects of obesity. Consistency with healthy diet and physical activity recommended. Daily brisk walking for 15-30 min as tolerated.   Benign paroxysmal positional vertigo, unspecified laterality  Improved. Fall precautions discussed. Instructed about warning signs.       Markevious Ehmke G. Martinique, MD  [facility]. [place] office.

## 2017-11-21 NOTE — Patient Instructions (Addendum)
A few things to remember from today's visit:   Essential hypertension, benign  Benign paroxysmal positional vertigo, unspecified laterality  BMI 45.0-49.9, adult (HCC)  Moderate intermittent asthma without complication  Allergic rhinitis, unspecified seasonality, unspecified trigger - Plan: fluticasone (FLONASE) 50 MCG/ACT nasal spray  Blood pressure goal for most people is less than 140/90. Some populations (older than 60) the goal is less than 150/90.  Most recent cardiologists' recommendations recommend blood pressure at or less than 130/80.   Elevated blood pressure increases the risk of strokes, heart and kidney disease, and eye problems. Regular physical activity and a healthy diet (DASH diet) usually help. Low salt diet. Take medications as instructed.  Caution with some over the counter medications as cold medications, dietary products (for weight loss), and Ibuprofen or Aleve (frequent use);all these medications could cause elevation of blood pressure.   Please be sure medication list is accurate. If a new problem present, please set up appointment sooner than planned today.

## 2017-11-21 NOTE — Assessment & Plan Note (Signed)
Not well controlled. Possible complications of elevated BP discussed. She is not interested in antihypertensive medication,afraid of side effects. Annual eye examination. F/U in 3-4 months.

## 2017-11-21 NOTE — Assessment & Plan Note (Signed)
Continue Zyrtec 10 mg daily. I recommend Flonase nasal spray, she is reluctant to use. Saline irrigations as needed.

## 2018-02-18 NOTE — Progress Notes (Signed)
HPI:   Alejandra Vega is a 62 y.o. female, who is here today for 3 months follow up.   She was last seen on 11/21/17.   Hypertension:  Currently on non pharmacologic treatment. She did notcontinue Amlodipine. Home BP readings: Not checking.  She has not noted unusual headache, visual changes, exertional chest pain, dyspnea,  focal weakness, or edema.   Lab Results  Component Value Date   CREATININE 0.81 10/18/2017   BUN 12 10/18/2017   NA 139 10/18/2017   K 4.5 10/18/2017   CL 101 10/18/2017   CO2 31 10/18/2017    Asthma: She is on Albuterol,she has inh and neb solution,the latter one is cheaper. She cannot afford ICS/LABA meds. She did not fill Rx for Pulmicort sl.  She is not longer on Singulair.  She has had intermittent episodes of wheezing since 12/2017, worse for the past 5 days. Today she feels better. No fever or chills. + Fatigue.  Exacerbated by exposed to flowers,poller,and dust.  Congestion and rhinorrhea. + Chills. Some co-workers have been coughing.  Itchy nose and throat. She stopped Flonase because it was irritating nose,increased itching.  She is on Zyrtec 10 mg but does not take it daily. Benadryl helps with symptoms.  She could not afford seeing immunologist.    Obesity: Dietary changes since last OV: She is still trying to work on improving her diet. She is avoiding soy, eggs, corn, and all other.  She still having difficulty controlling sweet intake.  Exercise: She is doing some walking and stretching exercises a few times per week.  She is down one size.  HLD: On non pharmacologic treatment. She follows a low fat diet.  Lab Results  Component Value Date   CHOL 235 (H) 03/01/2017   HDL 55.40 03/01/2017   LDLCALC 160 (H) 03/01/2017   TRIG 99.0 03/01/2017   CHOLHDL 4 03/01/2017      Review of Systems  Constitutional: Positive for fatigue. Negative for activity change, appetite change, fever and unexpected  weight change.  HENT: Positive for congestion, postnasal drip, rhinorrhea, sinus pressure and sneezing. Negative for facial swelling, mouth sores, nosebleeds, sore throat and trouble swallowing.   Eyes: Negative for redness and visual disturbance.  Respiratory: Positive for cough and wheezing. Negative for shortness of breath.   Cardiovascular: Negative for chest pain, palpitations and leg swelling.  Gastrointestinal: Negative for abdominal pain, nausea and vomiting.       Negative for changes in bowel habits.  Genitourinary: Negative for decreased urine volume and hematuria.  Musculoskeletal: Positive for arthralgias. Negative for joint swelling.  Allergic/Immunologic: Positive for environmental allergies.  Neurological: Negative for syncope, weakness and headaches.     Current Outpatient Medications on File Prior to Visit  Medication Sig Dispense Refill  . acetaminophen (TYLENOL) 500 MG tablet Take 500 mg by mouth as needed.    . cetirizine (ZYRTEC) 10 MG tablet Take 10 mg by mouth daily.    . Cyanocobalamin (VITAMIN B 12 PO) Take by mouth daily.    . diphenhydrAMINE (BENADRYL) 25 MG tablet Take 25 mg by mouth as needed.    . meclizine (ANTIVERT) 25 MG tablet Take 1 tablet (25 mg total) by mouth 3 (three) times daily as needed for dizziness. 45 tablet 0  . Spacer/Aero-Holding Chambers (AEROCHAMBER PLUS) inhaler Use as instructed 1 each 2   Current Facility-Administered Medications on File Prior to Visit  Medication Dose Route Frequency Provider Last Rate Last Dose  .  0.9 %  sodium chloride infusion  500 mL Intravenous Once Pyrtle, Lajuan Lines, MD         Past Medical History:  Diagnosis Date  . Allergy   . Anemia    with pregnancy   . Anxiety   . Asthma    as child/ severe  . Gastric ulcer 2013  . Heart murmur   . Hypertension    no meds/ due to stress   Allergies  Allergen Reactions  . Bee Venom     Anaphylaxis  . Glutethimides Shortness Of Breath  . Penicillins Hives,  Shortness Of Breath, Itching and Rash  . Shrimp [Shellfish Allergy] Anaphylaxis  . Latex     Itching on hands  . Soy Allergy     Causes diarrhea  . Pork-Derived Products Other (See Comments)    Head ache     Social History   Socioeconomic History  . Marital status: Legally Separated    Spouse name: Not on file  . Number of children: Not on file  . Years of education: Not on file  . Highest education level: Not on file  Occupational History  . Not on file  Social Needs  . Financial resource strain: Not on file  . Food insecurity:    Worry: Not on file    Inability: Not on file  . Transportation needs:    Medical: Not on file    Non-medical: Not on file  Tobacco Use  . Smoking status: Never Smoker  . Smokeless tobacco: Never Used  Substance and Sexual Activity  . Alcohol use: No  . Drug use: No  . Sexual activity: Not Currently  Lifestyle  . Physical activity:    Days per week: Not on file    Minutes per session: Not on file  . Stress: Not on file  Relationships  . Social connections:    Talks on phone: Not on file    Gets together: Not on file    Attends religious service: Not on file    Active member of club or organization: Not on file    Attends meetings of clubs or organizations: Not on file    Relationship status: Not on file  Other Topics Concern  . Not on file  Social History Narrative  . Not on file    Vitals:   02/19/18 0821  BP: 122/84  Pulse: 80  Resp: 16  Temp: 97.6 F (36.4 C)  SpO2: 96%   Body mass index is 43.26 kg/m.   Wt Readings from Last 3 Encounters:  02/19/18 252 lb (114.3 kg)  11/21/17 251 lb 8 oz (114.1 kg)  10/18/17 247 lb 4 oz (112.2 kg)      Physical Exam  Nursing note and vitals reviewed. Constitutional: She is oriented to person, place, and time. She appears well-developed. No distress.  HENT:  Head: Normocephalic and atraumatic.  Nose: Rhinorrhea present.  Mouth/Throat: Oropharynx is clear and moist and  mucous membranes are normal.  Hypertrophic turbinates. Mouth breathing. Postnasal drainage.  Eyes: Pupils are equal, round, and reactive to light. Conjunctivae are normal.  Cardiovascular: Normal rate and regular rhythm.  No murmur heard. Pulses:      Dorsalis pedis pulses are 2+ on the right side, and 2+ on the left side.  Respiratory: Effort normal and breath sounds normal. No respiratory distress. She has no wheezes. She has no rhonchi. She has no rales.  Prolonged expiration.  GI: Soft. She exhibits no mass. There is no  hepatomegaly. There is no tenderness.  Musculoskeletal: She exhibits edema (1+ pitting LE edema,bilateral).  Lymphadenopathy:    She has no cervical adenopathy.  Neurological: She is alert and oriented to person, place, and time. She has normal strength. No cranial nerve deficit. Gait normal.  Skin: Skin is warm. No rash noted. No erythema.  Psychiatric: She has a normal mood and affect.  Well groomed, good eye contact.       ASSESSMENT AND PLAN:   Alejandra Vega was seen today for 3 months follow-up.  Orders Placed This Encounter  Procedures  . Lipid panel   Lab Results  Component Value Date   CHOL 249 (H) 02/19/2018   HDL 52.30 02/19/2018   LDLCALC 168 (H) 02/19/2018   TRIG 144.0 02/19/2018   CHOLHDL 5 02/19/2018   The 10-year ASCVD risk score Mikey Bussing DC Jr., et al., 2013) is: 7.4%   Values used to calculate the score:     Age: 19 years     Sex: Female     Is Non-Hispanic African American: Yes     Diabetic: No     Tobacco smoker: No     Systolic Blood Pressure: 366 mmHg     Is BP treated: No     HDL Cholesterol: 52.3 mg/dL     Total Cholesterol: 249 mg/dL  Allergic rhinitis Problem is not well controlled. Recommend continuing Zyrtec 10 mg daily. Astelin nasal spray twice daily. Nasal saline irrigations several times per day. Singulair 10 mg daily.   Essential hypertension, benign BP well controlled on nonpharmacologic  treatment. Recommend monitoring BP at home. Continue low-salt diet. Instructed about warning signs.  Moderate intermittent asthma Problem is not well controlled.  She cannot afford inhalers. She has a nebulizer machine at home, so albuterol solution recommended 3 times daily as needed.  We discussed side effects of medication. She will fine into GoodRx to see if some of meds are included in discounts. Instructed about warning signs.  Hyperlipidemia She will continue on nonpharmacologic treatment for now. Further recommendations will be given according to lipid panel results as well as 10 years score for CVD.  Morbid obesity with BMI of 45.0-49.9, adult ([facility]) We discussed benefits of wt loss as well as adverse effects of obesity. Consistency with healthy diet and physical activity recommended.       Betty G. Martinique, MD  [facility]. [place] office.

## 2018-02-19 ENCOUNTER — Encounter: Payer: Self-pay | Admitting: Family Medicine

## 2018-02-19 ENCOUNTER — Ambulatory Visit (INDEPENDENT_AMBULATORY_CARE_PROVIDER_SITE_OTHER): Payer: 59 | Admitting: Family Medicine

## 2018-02-19 ENCOUNTER — Encounter: Payer: Self-pay | Admitting: *Deleted

## 2018-02-19 VITALS — BP 122/84 | HR 80 | Temp 97.6°F | Resp 16 | Ht 64.0 in | Wt 252.0 lb

## 2018-02-19 DIAGNOSIS — I10 Essential (primary) hypertension: Secondary | ICD-10-CM | POA: Diagnosis not present

## 2018-02-19 DIAGNOSIS — J4520 Mild intermittent asthma, uncomplicated: Secondary | ICD-10-CM | POA: Diagnosis not present

## 2018-02-19 DIAGNOSIS — E782 Mixed hyperlipidemia: Secondary | ICD-10-CM | POA: Diagnosis not present

## 2018-02-19 DIAGNOSIS — Z6841 Body Mass Index (BMI) 40.0 and over, adult: Secondary | ICD-10-CM

## 2018-02-19 DIAGNOSIS — IMO0001 Reserved for inherently not codable concepts without codable children: Secondary | ICD-10-CM

## 2018-02-19 DIAGNOSIS — J309 Allergic rhinitis, unspecified: Secondary | ICD-10-CM

## 2018-02-19 LAB — LIPID PANEL
CHOL/HDL RATIO: 5
Cholesterol: 249 mg/dL — ABNORMAL HIGH (ref 0–200)
HDL: 52.3 mg/dL (ref >39.00)
LDL Cholesterol: 168 mg/dL — ABNORMAL HIGH (ref 0–99)
NonHDL: 196.62
Triglycerides: 144 mg/dL (ref 0.0–149.0)
VLDL: 28.8 mg/dL (ref 0.0–40.0)

## 2018-02-19 MED ORDER — MONTELUKAST SODIUM 10 MG PO TABS: 10.0000 mg | ORAL_TABLET | Freq: Every day | ORAL | 2 refills | Status: DC

## 2018-02-19 MED ORDER — ALBUTEROL SULFATE (2.5 MG/3ML) 0.083% IN NEBU: 2.5000 mg | INHALATION_SOLUTION | Freq: Three times a day (TID) | RESPIRATORY_TRACT | 2 refills | Status: DC | PRN

## 2018-02-19 MED ORDER — AZELASTINE HCL 0.1 % NA SOLN: 2.0000 | Freq: Two times a day (BID) | NASAL | 6 refills | Status: DC

## 2018-02-19 NOTE — Assessment & Plan Note (Signed)
We discussed benefits of wt loss as well as adverse effects of obesity. Consistency with healthy diet and physical activity recommended.  

## 2018-02-19 NOTE — Assessment & Plan Note (Signed)
BP well controlled on nonpharmacologic treatment. Recommend monitoring BP at home. Continue low-salt diet. Instructed about warning signs.

## 2018-02-19 NOTE — Assessment & Plan Note (Signed)
Problem is not well controlled. Recommend continuing Zyrtec 10 mg daily. Astelin nasal spray twice daily. Nasal saline irrigations several times per day. Singulair 10 mg daily.

## 2018-02-19 NOTE — Patient Instructions (Addendum)
A few things to remember from today's visit:   Essential hypertension, benign  Moderate intermittent asthma without complication - Plan: montelukast (SINGULAIR) 10 MG tablet, albuterol (PROVENTIL) (2.5 MG/3ML) 0.083% nebulizer solution  Morbid obesity with BMI of 45.0-49.9, adult (HCC)  Mixed hyperlipidemia  Allergic rhinitis, unspecified seasonality, unspecified trigger - Plan: azelastine (ASTELIN) 0.1 % nasal spray, montelukast (SINGULAIR) 10 MG tablet  Continue nonpharmacologic treatment for blood pressure. Please look into goodRx and see if any of your medications is included in the program. Try Astelin nasal spray for nasal congestion. Nasal saline irrigations several times per day and as needed. Continue working on weight loss. Try albuterol solution every 8 hours for 2 to 3 days then continue as needed.  Please be sure medication list is accurate. If a new problem present, please set up appointment sooner than planned today.

## 2018-02-19 NOTE — Assessment & Plan Note (Addendum)
Problem is not well controlled.  She cannot afford inhalers. She has a nebulizer machine at home, so albuterol solution recommended 3 times daily as needed.  We discussed side effects of medication. She will fine into GoodRx to see if some of meds are included in discounts. Instructed about warning signs.

## 2018-02-19 NOTE — Assessment & Plan Note (Signed)
She will continue on nonpharmacologic treatment for now. Further recommendations will be given according to lipid panel results as well as 10 years score for CVD.

## 2018-02-22 ENCOUNTER — Encounter: Payer: Self-pay | Admitting: Family Medicine

## 2018-05-30 ENCOUNTER — Ambulatory Visit: Payer: Self-pay | Admitting: *Deleted

## 2018-05-30 ENCOUNTER — Encounter: Payer: Self-pay | Admitting: Family Medicine

## 2018-05-30 ENCOUNTER — Ambulatory Visit (INDEPENDENT_AMBULATORY_CARE_PROVIDER_SITE_OTHER): Payer: 59 | Admitting: Family Medicine

## 2018-05-30 VITALS — BP 178/110 | HR 84 | Temp 97.8°F | Ht 64.0 in | Wt 247.0 lb

## 2018-05-30 DIAGNOSIS — J45901 Unspecified asthma with (acute) exacerbation: Secondary | ICD-10-CM

## 2018-05-30 DIAGNOSIS — J45998 Other asthma: Secondary | ICD-10-CM

## 2018-05-30 MED ORDER — PREDNISONE 20 MG PO TABS: ORAL_TABLET | ORAL | 0 refills | Status: DC

## 2018-05-30 NOTE — Progress Notes (Signed)
Alejandra Vega is a 63 y.o. female  Chief Complaint  Patient presents with  . Cough    started 04/2018,SOB when laying/ using nebulizer, productive cough yellow mucus, fatigue, OTC tylenol and zyrtec    HPI: Alejandra Vega is a 63 y.o. female complains of 2 week h/o cough. Cough has been dry but is now productive at times.  Cough and SOB as well as fatigue worse in the past 4-5 days. Symptoms worse with exertion, prolonged talking or laughing, laying down at night. She also states cough comes on when she first walks outside into the cold air.  No fever, chills. No runny nose, nasal congestion, ear pain, sore throat.  Pt has been using cough drops, drinking cold ginger ale. She has been using PRN albuterol neb but she states this "makes her crazy" Pt is taking zyrtec daily d/t working at [employer] and being around lumber and other environmental allergens. She is not taking singulair or using astelin nasal spray. Pt states PCP told her she has asthma but pt does not want to think so.   Past Medical History:  Diagnosis Date  . Allergy   . Anemia    with pregnancy   . Anxiety   . Asthma    as child/ severe  . Gastric ulcer 2013  . Heart murmur   . Hypertension    no meds/ due to stress    Past Surgical History:  Procedure Laterality Date  . DILATION AND CURETTAGE OF UTERUS     2 times  . miscarriages     had 2  . PILONIDAL CYST EXCISION  1988   2 times  . WISDOM TOOTH EXTRACTION     age 27    Social History   Socioeconomic History  . Marital status: Legally Separated    Spouse name: Not on file  . Number of children: Not on file  . Years of education: Not on file  . Highest education level: Not on file  Occupational History  . Not on file  Social Needs  . Financial resource strain: Not on file  . Food insecurity:    Worry: Not on file    Inability: Not on file  . Transportation needs:    Medical: Not on file    Non-medical: Not on file  Tobacco Use  .  Smoking status: Never Smoker  . Smokeless tobacco: Never Used  Substance and Sexual Activity  . Alcohol use: No  . Drug use: No  . Sexual activity: Not Currently  Lifestyle  . Physical activity:    Days per week: Not on file    Minutes per session: Not on file  . Stress: Not on file  Relationships  . Social connections:    Talks on phone: Not on file    Gets together: Not on file    Attends religious service: Not on file    Active member of club or organization: Not on file    Attends meetings of clubs or organizations: Not on file    Relationship status: Not on file  . Intimate partner violence:    Fear of current or ex partner: Not on file    Emotionally abused: Not on file    Physically abused: Not on file    Forced sexual activity: Not on file  Other Topics Concern  . Not on file  Social History Narrative  . Not on file    Family History  Problem Relation Age of Onset  .  Hypertension Mother   . Heart disease Father   . Alcohol abuse Father   . Breast cancer Maternal Aunt   . Colon cancer Neg Hx   . Colon polyps Neg Hx   . Esophageal cancer Neg Hx   . Rectal cancer Neg Hx   . Stomach cancer Neg Hx      Immunization History  Administered Date(s) Administered  . Pneumococcal Polysaccharide-23 01/30/2017  . Tdap 01/30/2017    Outpatient Encounter Medications as of 05/30/2018  Medication Sig  . acetaminophen (TYLENOL) 500 MG tablet Take 500 mg by mouth as needed.  Marland Kitchen albuterol (PROVENTIL) (2.5 MG/3ML) 0.083% nebulizer solution Take 3 mLs (2.5 mg total) by nebulization every 8 (eight) hours as needed for wheezing or shortness of breath.  Marland Kitchen azelastine (ASTELIN) 0.1 % nasal spray Place 2 sprays into both nostrils 2 (two) times daily. Use in each nostril as directed  . cetirizine (ZYRTEC) 10 MG tablet Take 10 mg by mouth daily.  . Cyanocobalamin (VITAMIN B 12 PO) Take by mouth daily.  . diphenhydrAMINE (BENADRYL) 25 MG tablet Take 25 mg by mouth as needed.  .  meclizine (ANTIVERT) 25 MG tablet Take 1 tablet (25 mg total) by mouth 3 (three) times daily as needed for dizziness.  . montelukast (SINGULAIR) 10 MG tablet Take 1 tablet (10 mg total) by mouth at bedtime.  Marland Kitchen Spacer/Aero-Holding Chambers (AEROCHAMBER PLUS) inhaler Use as instructed  . predniSONE (DELTASONE) 20 MG tablet Take 3 tabs po x 3 days, 2 tabs po x 3 days, 1 tab po x 3 days, 1/2 tab po x 3 days   Facility-Administered Encounter Medications as of 05/30/2018  Medication  . 0.9 %  sodium chloride infusion     ROS: Pertinent positives and negatives noted in HPI. Remainder of ROS non-contributory    Allergies  Allergen Reactions  . Bee Venom     Anaphylaxis  . Glutethimides Shortness Of Breath  . Penicillins Hives, Shortness Of Breath, Itching and Rash  . Shrimp [Shellfish Allergy] Anaphylaxis  . Latex     Itching on hands  . Soy Allergy     Causes diarrhea  . Pork-Derived Products Other (See Comments)    Head ache     BP (!) 178/110   Pulse 84   Temp 97.8 F (36.6 C) (Oral)   Ht 5\' 4"  (1.626 m)   Wt 247 lb (112 kg)   SpO2 98%   BMI 42.40 kg/m   BP Readings from Last 3 Encounters:  05/30/18 (!) 178/110  02/19/18 122/84  11/21/17 140/80      Physical Exam  Constitutional: She is oriented to person, place, and time. She appears well-developed and well-nourished. No distress.  HENT:  Head: Normocephalic and atraumatic.  Right Ear: Tympanic membrane and ear canal normal.  Left Ear: Tympanic membrane and ear canal normal.  Nose: No mucosal edema or rhinorrhea. Right sinus exhibits no maxillary sinus tenderness and no frontal sinus tenderness. Left sinus exhibits no maxillary sinus tenderness and no frontal sinus tenderness.  Mouth/Throat: Mucous membranes are normal. Posterior oropharyngeal erythema present. No oropharyngeal exudate or posterior oropharyngeal edema.  Neck: Neck supple. No JVD present.  Cardiovascular: Normal rate, regular rhythm, normal heart  sounds and intact distal pulses.  Pulmonary/Chest: Effort normal. No respiratory distress. She has wheezes (diffuse wheeze, decreased air movement). She has no rhonchi.  Musculoskeletal:        General: No edema.  Lymphadenopathy:    She has no cervical adenopathy.  Neurological: She is alert and oriented to person, place, and time.  Psychiatric: She has a normal mood and affect. Her behavior is normal.     A/P:  1. Post-viral reactive airway disease 2. Mild asthma with acute exacerbation, unspecified whether persistent - cont supportive care - mucinex BID - cont PRN albuterol neb Rx: - predniSONE (DELTASONE) 20 MG tablet; Take 3 tabs po x 3 days, 2 tabs po x 3 days, 1 tab po x 3 days, 1/2 tab po x 3 days  Dispense: 20 tablet; Refill: 0 - work note give - f/u if symptoms worsen or do not improve in 10-14 days Discussed plan and reviewed medications with patient, including risks, benefits, and potential side effects. Pt expressed understand. All questions answered.

## 2018-05-30 NOTE — Patient Instructions (Signed)
Drink plenty of fluids, especially water Rest Use nasal saline spray at least 3 times per day Use humidifier at night Try Mucinex 1 tab twice per day Take prednisone as directed Cont zyrtec Follow-up if symptoms worsen or do not improve in 7-10 days

## 2018-05-30 NOTE — Telephone Encounter (Signed)
Patient is calling to report that she thought she had a virus- but she is not getting better- she is experiencing cough and SOB with exertion. She is having to sleeping sitting up- patient is refusing disposition ed because she has minor with her and she does not want to take her to hospital.  Call to PCP- they are full but suggest call to another office since she is refusing to go to ED- will call another office. Call to [place] will see the patient.  Reason for Disposition . [1] MODERATE difficulty breathing (e.g., speaks in phrases, SOB even at rest, pulse 100-120) AND [2] NEW-onset or WORSE than normal  Answer Assessment - Initial Assessment Questions 1. RESPIRATORY STATUS: "Describe your breathing?" (e.g., wheezing, shortness of breath, unable to speak, severe coughing)      Winded walking to/from car, exertion,  2. ONSET: "When did this breathing problem begin?"      Catching breath is difficult- can not lay down- without coughing 3. PATTERN "Does the difficult breathing come and go, or has it been constant since it started?"      constant 4. SEVERITY: "How bad is your breathing?" (e.g., mild, moderate, severe)    - MILD: No SOB at rest, mild SOB with walking, speaks normally in sentences, can lay down, no retractions, pulse < 100.    - MODERATE: SOB at rest, SOB with minimal exertion and prefers to sit, cannot lie down flat, speaks in phrases, mild retractions, audible wheezing, pulse 100-120.    - SEVERE: Very SOB at rest, speaks in single words, struggling to breathe, sitting hunched forward, retractions, pulse > 120      moderate 5. RECURRENT SYMPTOM: "Have you had difficulty breathing before?" If so, ask: "When was the last time?" and "What happened that time?"      Couple years ago- similar type of issue- now is different- inchest 6. CARDIAC HISTORY: "Do you have any history of heart disease?" (e.g., heart attack, angina, bypass surgery, angioplasty)      no 7. LUNG HISTORY:  "Do you have any history of lung disease?"  (e.g., pulmonary embolus, asthma, emphysema)     Asthma history,allergies 8. CAUSE: "What do you think is causing the breathing problem?"      Cough- thought she had virus 9. OTHER SYMPTOMS: "Do you have any other symptoms? (e.g., dizziness, runny nose, cough, chest pain, fever)     Cough, some dizziness- equilibrium off , 10. PREGNANCY: "Is there any chance you are pregnant?" "When was your last menstrual period?"       n/a 11. TRAVEL: "Have you traveled out of the country in the last month?" (e.g., travel history, exposures)       no  Protocols used: BREATHING DIFFICULTY-A-AH

## 2018-05-31 NOTE — Telephone Encounter (Signed)
Noted. FYI sent to Dr. Jordan. 

## 2018-06-05 ENCOUNTER — Telehealth: Payer: Self-pay

## 2018-06-05 ENCOUNTER — Other Ambulatory Visit: Payer: Self-pay | Admitting: Family Medicine

## 2018-06-05 DIAGNOSIS — Z1231 Encounter for screening mammogram for malignant neoplasm of breast: Secondary | ICD-10-CM

## 2018-06-05 NOTE — Telephone Encounter (Signed)
Spoke with patient and she stated that she is having some dizziness and nausea since starting the Prednisone was worried if this was a side effect from the medication or due to the sickness. Patient advised to keep appointment for Friday, 06/07/2018 with PCP.

## 2018-06-05 NOTE — Telephone Encounter (Signed)
Patient informed of recommendations per Dr. Martinique and verbalized understanding.

## 2018-06-05 NOTE — Telephone Encounter (Signed)
Symptoms she is reporting could be related to prednisone, try taking medication with breakfast in the morning. Keep follow-up appointment. Thanks, BJ

## 2018-06-05 NOTE — Telephone Encounter (Signed)
Copied from [place] [id number]. Topic: General - Other >> Jun 05, 2018  8:24 AM Keene Breath wrote: Reason for CRM: Patient called to request that the nurse call her regarding her prednisone medication.  She stated that she has a few questions that she would like addressed.  CB# [phone]

## 2018-06-07 ENCOUNTER — Encounter: Payer: Self-pay | Admitting: Family Medicine

## 2018-06-07 ENCOUNTER — Ambulatory Visit (INDEPENDENT_AMBULATORY_CARE_PROVIDER_SITE_OTHER): Payer: 59 | Admitting: Family Medicine

## 2018-06-07 VITALS — BP 150/90 | HR 86 | Temp 97.9°F | Resp 16 | Ht 64.0 in | Wt 244.5 lb

## 2018-06-07 DIAGNOSIS — J309 Allergic rhinitis, unspecified: Secondary | ICD-10-CM

## 2018-06-07 DIAGNOSIS — I10 Essential (primary) hypertension: Secondary | ICD-10-CM

## 2018-06-07 DIAGNOSIS — K219 Gastro-esophageal reflux disease without esophagitis: Secondary | ICD-10-CM

## 2018-06-07 DIAGNOSIS — J4520 Mild intermittent asthma, uncomplicated: Secondary | ICD-10-CM

## 2018-06-07 DIAGNOSIS — IMO0001 Reserved for inherently not codable concepts without codable children: Secondary | ICD-10-CM

## 2018-06-07 MED ORDER — OMEPRAZOLE 20 MG PO CPDR: 20.0000 mg | DELAYED_RELEASE_CAPSULE | Freq: Every day | ORAL | 0 refills | Status: DC

## 2018-06-07 NOTE — Patient Instructions (Addendum)
A few things to remember from today's visit:   Essential hypertension, benign  Moderate intermittent asthma without complication  Allergic rhinitis, unspecified seasonality, unspecified trigger  Gastroesophageal reflux disease, esophagitis presence not specified - Plan: omeprazole (PRILOSEC) 20 MG capsule Resume Singulair 10 mg at bedtime. Resume Zyrtec 10 mg in the morning. Complete prednisone treatment, take medication with breakfast. Nasal irrigation with saline will also help nasal congestion.  Omeprazole 20 mg 30 minutes before breakfast for 2 to 3 weeks.   Please be sure medication list is accurate. If a new problem present, please set up appointment sooner than planned today.

## 2018-06-07 NOTE — Progress Notes (Signed)
HPI:   Alejandra Vega is a 63 y.o. female, who is here today to follow on recent OV.   She was seen on 05/30/17 because worsening respiratory symptoms: Fever,chills,dyspnea,wheezing,and cough. Fever has resolved.  In general she is feeling better.  Still having cough but "every now and then",non  productive and wheezing, negative for dyspnea. + Fatigue and muscles "soreness" when she coughs.  Cough is worse at night when lying down.  Asthma and allergic rhinitis: She cannot afford inhalers. Currently she is on Albuterol neb 1/2 Am and 1/2 at bedtime.If she has the full dose she has tachycardia and tremor.  + Nasal congestion and rhinorrhea. She is not taking Zyrtec or Singulair. Exacerbated by dust and other environmental hazards at work. She could not afford visit with immunologist.  She is feeling better because she has not worked for a few days. No Hx of tobacco use  "Digestive issues" Episodes of nausea and vomiting after food intake x 2, "not a lot." Epigastric "bad" pain that interferes with sleep. Abdominal pain since she started Prednisone, "gas pain.". She has not identified exacerbating factors, alleviated by eating oatmeal or grits. Negative for heartburn or acid reflux.  She is trying to avoid foods that can aggravate symptoms.  HTN: She is on non pharmacologic treatment. Amlodipine recommended in the past but she refused. She is not checking BP at home.  She is not following low salt diet, eating canned soups.  Denies unusual headache,chest pain,or edema. Lab Results  Component Value Date   CREATININE 0.81 10/18/2017   BUN 12 10/18/2017   NA 139 10/18/2017   K 4.5 10/18/2017   CL 101 10/18/2017   CO2 31 10/18/2017     Review of Systems  Constitutional: Positive for activity change, appetite change and fatigue. Negative for fever.  HENT: Positive for congestion, postnasal drip and rhinorrhea. Negative for mouth sores, nosebleeds,  sore throat and trouble swallowing.   Eyes: Negative for redness and visual disturbance.  Respiratory: Positive for cough. Negative for shortness of breath and wheezing.   Cardiovascular: Negative for chest pain, palpitations and leg swelling.  Gastrointestinal: Positive for abdominal pain, constipation, nausea and vomiting. Negative for blood in stool.  Genitourinary: Negative for decreased urine volume and hematuria.  Neurological: Negative for syncope, weakness and headaches.  Psychiatric/Behavioral: Positive for sleep disturbance. Negative for confusion.      Current Outpatient Medications on File Prior to Visit  Medication Sig Dispense Refill  . albuterol (PROVENTIL) (2.5 MG/3ML) 0.083% nebulizer solution Take 3 mLs (2.5 mg total) by nebulization every 8 (eight) hours as needed for wheezing or shortness of breath. 150 mL 2  . azelastine (ASTELIN) 0.1 % nasal spray Place 2 sprays into both nostrils 2 (two) times daily. Use in each nostril as directed 30 mL 6  . cetirizine (ZYRTEC) 10 MG tablet Take 10 mg by mouth daily.    . Cyanocobalamin (VITAMIN B 12 PO) Take by mouth daily.    . diphenhydrAMINE (BENADRYL) 25 MG tablet Take 25 mg by mouth as needed.    . meclizine (ANTIVERT) 25 MG tablet Take 1 tablet (25 mg total) by mouth 3 (three) times daily as needed for dizziness. 45 tablet 0  . montelukast (SINGULAIR) 10 MG tablet Take 1 tablet (10 mg total) by mouth at bedtime. 90 tablet 2  . predniSONE (DELTASONE) 20 MG tablet Take 3 tabs po x 3 days, 2 tabs po x 3 days, 1 tab po x 3  days, 1/2 tab po x 3 days 20 tablet 0  . Spacer/Aero-Holding Chambers (AEROCHAMBER PLUS) inhaler Use as instructed 1 each 2  . acetaminophen (TYLENOL) 500 MG tablet Take 500 mg by mouth as needed.     Current Facility-Administered Medications on File Prior to Visit  Medication Dose Route Frequency Provider Last Rate Last Dose  . 0.9 %  sodium chloride infusion  500 mL Intravenous Once Pyrtle, Lajuan Lines, MD          Past Medical History:  Diagnosis Date  . Allergy   . Anemia    with pregnancy   . Anxiety   . Asthma    as child/ severe  . Gastric ulcer 2013  . Heart murmur   . Hypertension    no meds/ due to stress   Allergies  Allergen Reactions  . Bee Venom     Anaphylaxis  . Glutethimides Shortness Of Breath  . Penicillins Hives, Shortness Of Breath, Itching and Rash  . Shrimp [Shellfish Allergy] Anaphylaxis  . Latex     Itching on hands  . Soy Allergy     Causes diarrhea  . Pork-Derived Products Other (See Comments)    Head ache     Social History   Socioeconomic History  . Marital status: Legally Separated    Spouse name: Not on file  . Number of children: Not on file  . Years of education: Not on file  . Highest education level: Not on file  Occupational History  . Not on file  Social Needs  . Financial resource strain: Not on file  . Food insecurity:    Worry: Not on file    Inability: Not on file  . Transportation needs:    Medical: Not on file    Non-medical: Not on file  Tobacco Use  . Smoking status: Never Smoker  . Smokeless tobacco: Never Used  Substance and Sexual Activity  . Alcohol use: No  . Drug use: No  . Sexual activity: Not Currently  Lifestyle  . Physical activity:    Days per week: Not on file    Minutes per session: Not on file  . Stress: Not on file  Relationships  . Social connections:    Talks on phone: Not on file    Gets together: Not on file    Attends religious service: Not on file    Active member of club or organization: Not on file    Attends meetings of clubs or organizations: Not on file    Relationship status: Not on file  Other Topics Concern  . Not on file  Social History Narrative  . Not on file    Vitals:   06/07/18 1437  BP: (!) 150/90  Pulse: 86  Resp: 16  Temp: 97.9 F (36.6 C)  SpO2: 98%   Body mass index is 41.97 kg/m.   Physical Exam  Nursing note and vitals reviewed. Constitutional: She  is oriented to person, place, and time. She appears well-developed. No distress.  HENT:  Head: Normocephalic and atraumatic.  Mouth/Throat: Oropharynx is clear and moist and mucous membranes are normal.  Eyes: Pupils are equal, round, and reactive to light. Conjunctivae are normal.  Cardiovascular: Normal rate and regular rhythm.  No murmur heard. Pulses:      Dorsalis pedis pulses are 2+ on the right side and 2+ on the left side.  Respiratory: Effort normal and breath sounds normal. No respiratory distress.  GI: Soft. She exhibits no mass.  There is no hepatomegaly. There is no abdominal tenderness.  Musculoskeletal:        General: Edema (Trace pitting LE edema, bilateral.) present.  Lymphadenopathy:    She has no cervical adenopathy.  Neurological: She is alert and oriented to person, place, and time. She has normal strength. No cranial nerve deficit. Gait normal.  Skin: Skin is warm. No rash noted. No erythema.  Psychiatric: She has a normal mood and affect.  Well groomed, good eye contact.    ASSESSMENT AND PLAN:  Ms. Avielle was seen today for follow-up, sinusitis and stomach issues.  Diagnoses and all orders for this visit:  Gastroesophageal reflux disease, esophagitis presence not specified Symptoms aggravated by prednisone. Examination today does not suggest acute abdomen, I do not think imaging is needed today. 2-3 weeks of Omeprazole 20 mg recommended. GERD precautions.  -     omeprazole (PRILOSEC) 20 MG capsule; Take 1 capsule (20 mg total) by mouth daily for 30 days.  Essential hypertension, benign BP elevated today. Instructed to monitor BP at home. She is not interested in pharmacologic treatment. Prednisone and recent illness could have aggravated problem. Low salt diet recommended. Instructed about warning signs.  Moderate intermittent asthma without complication Acute exacerbation resolved. She has same health insurance,so inhalers are not  covered. Recommend continuing Albuterol neb 1/2 bid prn, side effects discussed. Resume Singulair 10 mg.  Allergic rhinitis, unspecified seasonality, unspecified trigger Recommend resuming Singulair,Zyrtec,and Astelin nasal spray. Ideally she should avoid exposure to trigger factors (mainly through her job). Nasal irrigations with saline a few times per day and as needed.     Return in about 3 months (around 09/06/2018) for HTN,asthma.     Jalayiah Bibian G. Martinique, MD  [facility]. [place] office.

## 2018-06-08 ENCOUNTER — Encounter: Payer: Self-pay | Admitting: Family Medicine

## 2018-07-04 ENCOUNTER — Ambulatory Visit
Admission: RE | Admit: 2018-07-04 | Discharge: 2018-07-04 | Disposition: A | Discharge status: Home / Self Care | Payer: 59 | Source: Ambulatory Visit | Attending: Family Medicine | Admitting: Family Medicine

## 2018-07-04 DIAGNOSIS — Z1231 Encounter for screening mammogram for malignant neoplasm of breast: Secondary | ICD-10-CM

## 2018-07-17 ENCOUNTER — Encounter: Payer: Self-pay | Admitting: *Deleted

## 2018-07-17 ENCOUNTER — Encounter: Payer: Self-pay | Admitting: Family Medicine

## 2018-07-17 ENCOUNTER — Ambulatory Visit (INDEPENDENT_AMBULATORY_CARE_PROVIDER_SITE_OTHER): Payer: 59

## 2018-07-17 ENCOUNTER — Ambulatory Visit (INDEPENDENT_AMBULATORY_CARE_PROVIDER_SITE_OTHER): Payer: 59 | Admitting: Family Medicine

## 2018-07-17 VITALS — BP 130/80 | HR 83 | Temp 98.2°F | Resp 16 | Ht 64.0 in | Wt 247.0 lb

## 2018-07-17 DIAGNOSIS — J309 Allergic rhinitis, unspecified: Secondary | ICD-10-CM

## 2018-07-17 DIAGNOSIS — R0609 Other forms of dyspnea: Secondary | ICD-10-CM | POA: Diagnosis not present

## 2018-07-17 DIAGNOSIS — J988 Other specified respiratory disorders: Secondary | ICD-10-CM

## 2018-07-17 DIAGNOSIS — J4521 Mild intermittent asthma with (acute) exacerbation: Secondary | ICD-10-CM

## 2018-07-17 DIAGNOSIS — IMO0001 Reserved for inherently not codable concepts without codable children: Secondary | ICD-10-CM

## 2018-07-17 LAB — CBC WITH DIFFERENTIAL/PLATELET
Basophils Absolute: 0 10*3/uL (ref 0.0–0.1)
Basophils Relative: 0.5 % (ref 0.0–3.0)
EOS PCT: 2.2 % (ref 0.0–5.0)
Eosinophils Absolute: 0.2 10*3/uL (ref 0.0–0.7)
HCT: 40 % (ref 36.0–46.0)
Hemoglobin: 12.9 g/dL (ref 12.0–15.0)
Lymphocytes Relative: 36.1 % (ref 12.0–46.0)
Lymphs Abs: 2.7 10*3/uL (ref 0.7–4.0)
MCHC: 32.1 g/dL (ref 30.0–36.0)
MCV: 79 fl (ref 78.0–100.0)
Monocytes Absolute: 0.9 10*3/uL (ref 0.1–1.0)
Monocytes Relative: 11.5 % (ref 3.0–12.0)
Neutro Abs: 3.7 10*3/uL (ref 1.4–7.7)
Neutrophils Relative %: 49.7 % (ref 43.0–77.0)
Platelets: 269 10*3/uL (ref 150.0–400.0)
RBC: 5.07 Mil/uL (ref 3.87–5.11)
RDW: 19.1 % — ABNORMAL HIGH (ref 11.5–15.5)
WBC: 7.5 10*3/uL (ref 4.0–10.5)

## 2018-07-17 MED ORDER — METHYLPREDNISOLONE ACETATE 40 MG/ML IJ SUSP
40.0000 mg | Freq: Once | INTRAMUSCULAR | Status: AC
  Administered 2018-07-17: 40 mg via INTRAMUSCULAR

## 2018-07-17 NOTE — Patient Instructions (Signed)
A few things to remember from today's visit:   Allergic rhinitis, unspecified seasonality, unspecified trigger - Plan: methylPREDNISolone acetate (DEPO-MEDROL) injection 40 mg  Moderate intermittent asthma with acute exacerbation - Plan: methylPREDNISolone acetate (DEPO-MEDROL) injection 40 mg, DG Chest 2 View  Dyspnea on exertion - Plan: DG Chest 2 View, CBC with Differential/Platelet  Please find out about Rx , if it helps with your med.   Please be sure medication list is accurate. If a new problem present, please set up appointment sooner than planned today.

## 2018-07-17 NOTE — Progress Notes (Signed)
ACUTE VISIT  HPI:  Chief Complaint  Patient presents with  . Cough    dry cough that is causing chest tightness  . Fatigue  . Headache    [name] is a 63 y.o.female here today complaining of 2 weeks of respiratory symptoms. Worsening cough, non production. Exertional dyspnea and wheezing.  Symptoms exacerbated by exertion and pollen/dust exposure. Alleviated by rest.  She walks at [employer] and frequently exposed to flowers.  Post nasal drainage and nasal congestion.  Worsening fatigue. Chest wall pain upon coughing.   Cough  This is a new problem. The current episode started 1 to 4 weeks ago. The problem has been gradually worsening. The cough is non-productive. Associated symptoms include chest pain, chills, nasal congestion, postnasal drip, rhinorrhea, shortness of breath and wheezing. Pertinent negatives include no ear congestion, ear pain, eye redness, fever, headaches, heartburn, hemoptysis, myalgias, rash or sore throat. The symptoms are aggravated by exercise. Risk factors for lung disease include occupational exposure. She has tried OTC cough suppressant (Albuterol neb) for the symptoms. The treatment provided mild relief. Her past medical history is significant for asthma and environmental allergies.   Asthma, she cannot afford inhalers. She has Albuterol neb at home,she uses 1/2 tid as needed,last used yesterday. Allergic rhinitis,she is not taking Singulair. She is taking Zyrtec 10 mg daily.   No Hx of recent travel. Co-workers have been sick. No known insect bite.   Review of Systems  Constitutional: Positive for activity change, appetite change, chills and fatigue. Negative for fever.  HENT: Positive for congestion, postnasal drip, rhinorrhea and sinus pressure. Negative for ear pain, facial swelling, mouth sores, sore throat, trouble swallowing and voice change.   Eyes: Positive for discharge. Negative for redness and itching.    Respiratory: Positive for cough, chest tightness, shortness of breath and wheezing. Negative for hemoptysis.   Cardiovascular: Positive for chest pain. Negative for palpitations and leg swelling.  Gastrointestinal: Negative for abdominal pain, diarrhea, heartburn, nausea and vomiting.  Musculoskeletal: Positive for arthralgias. Negative for myalgias and neck pain.  Skin: Negative for rash.  Allergic/Immunologic: Positive for environmental allergies.  Neurological: Negative for syncope, weakness and headaches.  Hematological: Negative for adenopathy. Does not bruise/bleed easily.     Current Outpatient Medications on File Prior to Visit  Medication Sig Dispense Refill  . acetaminophen (TYLENOL) 500 MG tablet Take 500 mg by mouth as needed.    Marland Kitchen albuterol (PROVENTIL) (2.5 MG/3ML) 0.083% nebulizer solution Take 3 mLs (2.5 mg total) by nebulization every 8 (eight) hours as needed for wheezing or shortness of breath. 150 mL 2  . azelastine (ASTELIN) 0.1 % nasal spray Place 2 sprays into both nostrils 2 (two) times daily. Use in each nostril as directed 30 mL 6  . cetirizine (ZYRTEC) 10 MG tablet Take 10 mg by mouth daily.    . Cyanocobalamin (VITAMIN B 12 PO) Take by mouth daily.    . diphenhydrAMINE (BENADRYL) 25 MG tablet Take 25 mg by mouth as needed.    . meclizine (ANTIVERT) 25 MG tablet Take 1 tablet (25 mg total) by mouth 3 (three) times daily as needed for dizziness. 45 tablet 0  . montelukast (SINGULAIR) 10 MG tablet Take 1 tablet (10 mg total) by mouth at bedtime. 90 tablet 2  . omeprazole (PRILOSEC) 20 MG capsule Take 1 capsule (20 mg total) by mouth daily for 30 days. 30 capsule 0  . Spacer/Aero-Holding Chambers (AEROCHAMBER PLUS) inhaler Use as instructed  1 each 2   Current Facility-Administered Medications on File Prior to Visit  Medication Dose Route Frequency Provider Last Rate Last Dose  . 0.9 %  sodium chloride infusion  500 mL Intravenous Once Pyrtle, Lajuan Lines, MD          Past Medical History:  Diagnosis Date  . Allergy   . Anemia    with pregnancy   . Anxiety   . Asthma    as child/ severe  . Gastric ulcer 2013  . Heart murmur   . Hypertension    no meds/ due to stress   Allergies  Allergen Reactions  . Bee Venom     Anaphylaxis  . Glutethimides Shortness Of Breath  . Penicillins Hives, Shortness Of Breath, Itching and Rash  . Shrimp [Shellfish Allergy] Anaphylaxis  . Latex     Itching on hands  . Soy Allergy     Causes diarrhea  . Pork-Derived Products Other (See Comments)    Head ache     Social History   Socioeconomic History  . Marital status: Legally Separated    Spouse name: Not on file  . Number of children: Not on file  . Years of education: Not on file  . Highest education level: Not on file  Occupational History  . Not on file  Social Needs  . Financial resource strain: Not on file  . Food insecurity:    Worry: Not on file    Inability: Not on file  . Transportation needs:    Medical: Not on file    Non-medical: Not on file  Tobacco Use  . Smoking status: Never Smoker  . Smokeless tobacco: Never Used  Substance and Sexual Activity  . Alcohol use: No  . Drug use: No  . Sexual activity: Not Currently  Lifestyle  . Physical activity:    Days per week: Not on file    Minutes per session: Not on file  . Stress: Not on file  Relationships  . Social connections:    Talks on phone: Not on file    Gets together: Not on file    Attends religious service: Not on file    Active member of club or organization: Not on file    Attends meetings of clubs or organizations: Not on file    Relationship status: Not on file  Other Topics Concern  . Not on file  Social History Narrative  . Not on file    Vitals:   07/17/18 0814  BP: 130/80  Pulse: 83  Resp: 16  Temp: 98.2 F (36.8 C)  SpO2: 97%   Body mass index is 42.4 kg/m.   Physical Exam  Nursing note and vitals reviewed. Constitutional: She is  oriented to person, place, and time. She appears well-developed. She does not appear ill. No distress.  HENT:  Head: Normocephalic and atraumatic.  Nose: Rhinorrhea present. Right sinus exhibits no maxillary sinus tenderness and no frontal sinus tenderness. Left sinus exhibits no maxillary sinus tenderness and no frontal sinus tenderness.  Mouth/Throat: Oropharynx is clear and moist and mucous membranes are normal.  Eyes: Conjunctivae are normal.  Epiphora bilateral. Nasal voice. Postnasal drainage.  Neck: No muscular tenderness present. No edema and no erythema present.  Cardiovascular: Normal rate and regular rhythm.  No murmur heard. Respiratory: Effort normal and breath sounds normal. No stridor. No respiratory distress. She exhibits no tenderness.  Shallow breathing.   Lymphadenopathy:       Head (right side): No  submandibular adenopathy present.       Head (left side): No submandibular adenopathy present.    She has no cervical adenopathy.  Neurological: She is alert and oriented to person, place, and time. She has normal strength.  Skin: Skin is warm. No rash noted. No erythema.  Psychiatric: Her mood appears anxious.  Well groomed, good eye contact.     ASSESSMENT AND PLAN:   Ms. Kaleb was seen today for cough, fatigue and headache.  Diagnoses and all orders for this visit:  Moderate intermittent asthma with acute exacerbation Today no wheezing appreciated. Here in the office and after verbal consent she received Depo Medrol 40 mg IM x 1. Continue Albuterol neb 1/2 tid as needed. She will benefit greatly from ICS+LABA.  She cannot afford most of her medications, which is contributing to not compliance with treatments.  I gave her a few Rx cards,recommended to find out if her inhalales or meds are included ,she will let me know and will send Rx's that are covered.  -     methylPREDNISolone acetate (DEPO-MEDROL) injection 40 mg -     DG Chest 2 View; Future -     DG  Chest 2 View  Allergic rhinitis, unspecified seasonality, unspecified trigger Nasal irrigations with saline as needed. Continue Zyrtec 10 mg daily. Strongly recommend resuming Singulair.  -     methylPREDNISolone acetate (DEPO-MEDROL) injection 40 mg  Dyspnea on exertion This is a chronic problem,worse for the past few days. She is not in respiratory distress.  She has followed with cardiologist. Instructed about warning signs.  -     DG Chest 2 View; Future -     CBC with Differential/Platelet -     DG Chest 2 View  Respiratory tract infection -     doxycycline (VIBRA-TABS) 100 MG tablet; Take 1 tablet (100 mg total) by mouth 2 (two) times daily for 7 days.    Return in about 2 weeks (around 07/31/2018).      G. Martinique, MD  [facility]. [place] office.

## 2018-07-18 MED ORDER — DOXYCYCLINE HYCLATE 100 MG PO TABS: 100.0000 mg | ORAL_TABLET | Freq: Two times a day (BID) | ORAL | 0 refills | Status: AC

## 2018-07-21 ENCOUNTER — Encounter: Payer: Self-pay | Admitting: Family Medicine

## 2018-07-29 ENCOUNTER — Other Ambulatory Visit: Payer: Self-pay | Admitting: Family Medicine

## 2018-07-29 ENCOUNTER — Ambulatory Visit: Payer: Self-pay | Admitting: *Deleted

## 2018-07-29 MED ORDER — IPRATROPIUM-ALBUTEROL 0.5-2.5 (3) MG/3ML IN SOLN: 3.0000 mL | Freq: Three times a day (TID) | RESPIRATORY_TRACT | 1 refills | Status: AC | PRN

## 2018-07-29 NOTE — Telephone Encounter (Signed)
Pt seen by Dr. Martinique 07/17/2018, URI, acute exacerbation of asthma. Pt place on doxycycline 100mg  BID x 7 days. Pt states just started taking med Thursday 07/25/2018. States after 2 doses became "Forgetful."  States feels "Fuzzy, disoriented at times" with increased anxiety. States "Couldn't find the elevator at work." States has not experienced this today as she stayed home and "Got some rest." Denies any other symptoms. States "I think this is from the antibiotic." Pt questioning if Dr. Martinique wants her to continue taking (on day 5). States "Cough better, just some congestion remains, like I can tell something is there." Assured pt TN would alert practice. Instructed to go to ED if symptoms worsen.  Please advise regarding antibiotic. CB: [phone]   Reason for Disposition . Caller has NON-URGENT medication question about med that PCP prescribed and triager unable to answer question  Answer Assessment - Initial Assessment Questions 1. SYMPTOMS: "Do you have any symptoms?"     Yes, forgetfulness 2. SEVERITY: If symptoms are present, ask "Are they mild, moderate or severe?"    Mild, none presently  Protocols used: MEDICATION QUESTION CALL-A-AH

## 2018-07-30 NOTE — Telephone Encounter (Signed)
Patient need Rx for an inhaler sent to the pharmacy in order to see which discount cards will work. Patient also questioning whether or she need an antibiotic.

## 2018-07-30 NOTE — Telephone Encounter (Signed)
Message sent to Dr. Jordan for review. 

## 2018-07-31 ENCOUNTER — Other Ambulatory Visit: Payer: Self-pay | Admitting: Family Medicine

## 2018-07-31 DIAGNOSIS — J4521 Mild intermittent asthma with (acute) exacerbation: Secondary | ICD-10-CM

## 2018-07-31 DIAGNOSIS — IMO0001 Reserved for inherently not codable concepts without codable children: Secondary | ICD-10-CM

## 2018-07-31 MED ORDER — BUDESONIDE-FORMOTEROL FUMARATE 160-4.5 MCG/ACT IN AERO: 2.0000 | INHALATION_SPRAY | Freq: Two times a day (BID) | RESPIRATORY_TRACT | 3 refills | Status: DC

## 2018-07-31 MED ORDER — ALBUTEROL SULFATE HFA 108 (90 BASE) MCG/ACT IN AERS: 2.0000 | INHALATION_SPRAY | Freq: Four times a day (QID) | RESPIRATORY_TRACT | 0 refills | Status: DC | PRN

## 2018-07-31 NOTE — Telephone Encounter (Signed)
Patient informed and verbalized understanding. Patient stated that started the new breathing treatment last night and she feels much better. Patient stated that she will call and follow-up in 2 weeks as advised on AVS. Nothing further needed at this time.

## 2018-07-31 NOTE — Telephone Encounter (Signed)
Prescription for Symbicort and albuterol were sent to her pharmacy. I did treat with antibiotic after her last visit, 07/17/2018. If symptoms are getting worse she needs to arrange a follow-up visit.  Thanks, BJ

## 2018-08-13 ENCOUNTER — Other Ambulatory Visit: Payer: Self-pay

## 2018-08-13 ENCOUNTER — Encounter: Payer: Self-pay | Admitting: Family Medicine

## 2018-08-13 ENCOUNTER — Ambulatory Visit (INDEPENDENT_AMBULATORY_CARE_PROVIDER_SITE_OTHER): Payer: 59 | Admitting: Family Medicine

## 2018-08-13 VITALS — BP 130/82 | HR 82 | Temp 98.3°F | Resp 20 | Ht 64.0 in | Wt 247.5 lb

## 2018-08-13 DIAGNOSIS — R05 Cough: Secondary | ICD-10-CM

## 2018-08-13 DIAGNOSIS — J4521 Mild intermittent asthma with (acute) exacerbation: Secondary | ICD-10-CM | POA: Diagnosis not present

## 2018-08-13 DIAGNOSIS — R0602 Shortness of breath: Secondary | ICD-10-CM | POA: Insufficient documentation

## 2018-08-13 DIAGNOSIS — J309 Allergic rhinitis, unspecified: Secondary | ICD-10-CM | POA: Diagnosis not present

## 2018-08-13 DIAGNOSIS — R059 Cough, unspecified: Secondary | ICD-10-CM

## 2018-08-13 DIAGNOSIS — IMO0001 Reserved for inherently not codable concepts without codable children: Secondary | ICD-10-CM

## 2018-08-13 DIAGNOSIS — K219 Gastro-esophageal reflux disease without esophagitis: Secondary | ICD-10-CM

## 2018-08-13 MED ORDER — BUDESONIDE 0.5 MG/2ML IN SUSP: 0.5000 mg | Freq: Two times a day (BID) | RESPIRATORY_TRACT | 3 refills | Status: DC

## 2018-08-13 MED ORDER — OMEPRAZOLE 20 MG PO CPDR: 20.0000 mg | DELAYED_RELEASE_CAPSULE | Freq: Every day | ORAL | 0 refills | Status: DC

## 2018-08-13 NOTE — Patient Instructions (Addendum)
A few things to remember from today's visit:   Moderate intermittent asthma with acute exacerbation - Plan: budesonide (PULMICORT) 0.5 MG/2ML nebulizer solution  Allergic rhinitis, unspecified seasonality, unspecified trigger  Cough - Plan: Pulmonary Function Test  Shortness of breath - Plan: Pulmonary Function Test, CT Chest Wo Contrast  Gastroesophageal reflux disease, esophagitis presence not specified - Plan: omeprazole (PRILOSEC) 20 MG capsule  Continue DuoNeb 3 times daily as needed. Pulmicort started today to use twice daily. No changes in Singulair. You can change Zyrtec for Allegra 180 mg daily.  We will be arranging a chest CT and a pulmonary function test to evaluate for other causes of shortness of breath. Keep appointment with cardiologist. I will see you back in 4 to 6 weeks.  Please be sure medication list is accurate. If a new problem present, please set up appointment sooner than planned today.

## 2018-08-13 NOTE — Assessment & Plan Note (Signed)
We discussed possible etiologies. Certainly can be related to asthma. Because persistent symptoms despite above treatment, recommend chest CT. Instructed about warning signs. Follow-up in 4 weeks.

## 2018-08-13 NOTE — Assessment & Plan Note (Signed)
This probably could be contributing to some of her symptoms. GERD precautions discussed. Recommend resuming Prilosec 20 mg 30 minutes before breakfast. Follow-up in 4 weeks.

## 2018-08-13 NOTE — Progress Notes (Signed)
ACUTE VISIT  HPI:  Chief Complaint  Patient presents with  . Cough    sx not getting any better   . Chest Congestion    gets SOB when moving/walking     [name] is a 63 y.o.female here today complaining of persistent exertional dyspnea. She is using DuoNeb 3 times daily as needed, she has some temporary improvement, for about 1 to 2 hours. Nonproductive cough and intermittent wheezing. Symptoms are exacerbated by minimal exertion. Wheezing more noticeable at rest.  She was last seen on 07/17/18, when she was also c/o chest tightness and worsening SOB and cough.   Symptoms seem to improve but not resolve when she is not working. She works at [employer] and symptoms are exacerbated by fumes,pollen,and some smells.  She attributes recent symptoms to more use of detergents,lisol, and aerosols at work to clean surfaces,concerned about coronavirus.    Low retrosternal tightness sensation. Pain is intermittent,no radiated. It is not associated with palpitation,diaphoresis, or dizziness. It is sometimes associated with dyspnea. She has had exertional dyspnea intermittently for around 2 years, which seems to be getting worse.      Cough  This is a recurrent problem. The problem has been unchanged. The cough is non-productive. Associated symptoms include heartburn, nasal congestion, postnasal drip, rhinorrhea, shortness of breath and wheezing. Pertinent negatives include no chest pain, ear congestion, ear pain, eye redness, fever, headaches, myalgias, rash or sore throat. The symptoms are aggravated by exercise. Risk factors for lung disease include occupational exposure. Her past medical history is significant for asthma and environmental allergies. There is no history of COPD.    No Hx of recent travel. No sick contact.   Echo done in 02/2017: LV EF 55-60%,elevated LV filling pressure, diastolic dysfunction. She has an appt with cardiologist in a few weeks.   Hx of childhood asthma. No Hx of tobacco use.  Her insurance does not cover inhalers (Albuterol,Symbicort,or Advair).   Allergic rhinitis,worse around spring and with dust/fumes exposure. She is on Zyrtec 10 mg daily and takes Benadryl 25-50 mg daily as needed. She is also on Singulair 10 mg daily.   GERD: Currently she is not on Prilosec 20 mg. Seldom she has heartburn. Cough does not seem to be related with food intake.  Denies abdominal pain,nausea,or vomiting. Hard stool 2-3 stools daily. Negative for melena or blood in stool.   Review of Systems  Constitutional: Positive for activity change and fatigue. Negative for appetite change and fever.  HENT: Positive for congestion, postnasal drip, rhinorrhea and sneezing. Negative for ear pain, mouth sores, sinus pressure and sore throat.   Eyes: Negative for discharge, redness and itching.  Respiratory: Positive for cough, shortness of breath and wheezing.   Cardiovascular: Negative for chest pain and leg swelling.  Gastrointestinal: Positive for heartburn. Negative for abdominal pain, diarrhea, nausea and vomiting.  Musculoskeletal: Negative for myalgias and neck pain.  Skin: Negative for rash.  Allergic/Immunologic: Positive for environmental allergies.  Neurological: Negative for weakness and headaches.    Current Outpatient Medications on File Prior to Visit  Medication Sig Dispense Refill  . acetaminophen (TYLENOL) 500 MG tablet Take 500 mg by mouth as needed.    Marland Kitchen azelastine (ASTELIN) 0.1 % nasal spray Place 2 sprays into both nostrils 2 (two) times daily. Use in each nostril as directed 30 mL 6  . cetirizine (ZYRTEC) 10 MG tablet Take 10 mg by mouth daily.    . Cyanocobalamin (VITAMIN B  12 PO) Take by mouth daily.    . diphenhydrAMINE (BENADRYL) 25 MG tablet Take 25 mg by mouth as needed.    Marland Kitchen ipratropium-albuterol (DUONEB) 0.5-2.5 (3) MG/3ML SOLN Take 3 mLs by nebulization every 8 (eight) hours as needed. 360 mL 1  .  meclizine (ANTIVERT) 25 MG tablet Take 1 tablet (25 mg total) by mouth 3 (three) times daily as needed for dizziness. 45 tablet 0  . montelukast (SINGULAIR) 10 MG tablet Take 1 tablet (10 mg total) by mouth at bedtime. 90 tablet 2  . Spacer/Aero-Holding Chambers (AEROCHAMBER PLUS) inhaler Use as instructed 1 each 2   Current Facility-Administered Medications on File Prior to Visit  Medication Dose Route Frequency Provider Last Rate Last Dose  . 0.9 %  sodium chloride infusion  500 mL Intravenous Once Pyrtle, Lajuan Lines, MD         Past Medical History:  Diagnosis Date  . Allergy   . Anemia    with pregnancy   . Anxiety   . Asthma    as child/ severe  . Gastric ulcer 2013  . Heart murmur   . Hypertension    no meds/ due to stress   Allergies  Allergen Reactions  . Bee Venom     Anaphylaxis  . Glutethimides Shortness Of Breath  . Penicillins Hives, Shortness Of Breath, Itching and Rash  . Shrimp [Shellfish Allergy] Anaphylaxis  . Latex     Itching on hands  . Soy Allergy     Causes diarrhea  . Pork-Derived Products Other (See Comments)    Head ache     Social History   Socioeconomic History  . Marital status: Legally Separated    Spouse name: Not on file  . Number of children: Not on file  . Years of education: Not on file  . Highest education level: Not on file  Occupational History  . Not on file  Social Needs  . Financial resource strain: Not on file  . Food insecurity:    Worry: Not on file    Inability: Not on file  . Transportation needs:    Medical: Not on file    Non-medical: Not on file  Tobacco Use  . Smoking status: Never Smoker  . Smokeless tobacco: Never Used  Substance and Sexual Activity  . Alcohol use: No  . Drug use: No  . Sexual activity: Not Currently  Lifestyle  . Physical activity:    Days per week: Not on file    Minutes per session: Not on file  . Stress: Not on file  Relationships  . Social connections:    Talks on phone: Not on  file    Gets together: Not on file    Attends religious service: Not on file    Active member of club or organization: Not on file    Attends meetings of clubs or organizations: Not on file    Relationship status: Not on file  Other Topics Concern  . Not on file  Social History Narrative  . Not on file    Vitals:   08/13/18 1619  BP: 130/82  Pulse: 82  Resp: 20  Temp: 98.3 F (36.8 C)  SpO2: 99%   Body mass index is 42.48 kg/m.  Physical Exam  Nursing note and vitals reviewed. Constitutional: She is oriented to person, place, and time. She appears well-developed. She does not appear ill. No distress.  HENT:  Head: Normocephalic and atraumatic.  Right Ear: Tympanic membrane and  ear canal normal.  Left Ear: Tympanic membrane, external ear and ear canal normal.  Nose: Rhinorrhea present. Right sinus exhibits no maxillary sinus tenderness and no frontal sinus tenderness. Left sinus exhibits no maxillary sinus tenderness and no frontal sinus tenderness.  Mouth/Throat: Oropharynx is clear and moist and mucous membranes are normal.  Mildly hypertrophic turbinates.   Eyes: Conjunctivae are normal.  Neck: No muscular tenderness present.  Cardiovascular: Normal rate and regular rhythm.  No murmur heard. Respiratory: Effort normal and breath sounds normal. No respiratory distress.  Lymphadenopathy:    She has no cervical adenopathy.  Neurological: She is alert and oriented to person, place, and time. She has normal strength.  Skin: Skin is warm. No rash noted. No erythema.  Psychiatric: She has a normal mood and affect.  Well groomed, good eye contact.    ASSESSMENT AND PLAN:  [name] was seen today for cough and chest congestion.  Orders Placed This Encounter  Procedures  . CT Chest Wo Contrast  . Pulmonary Function Test   GERD (gastroesophageal reflux disease) This probably could be contributing to some of her symptoms. GERD precautions discussed. Recommend  resuming Prilosec 20 mg 30 minutes before breakfast. Follow-up in 4 weeks.  Shortness of breath We discussed possible etiologies. Certainly can be related to asthma. Because persistent symptoms despite above treatment, recommend chest CT. Instructed about warning signs. Follow-up in 4 weeks.  Moderate intermittent asthma Her insurance does not cover with any inhaler. Today Pulmicort nebulizer solution was added to use twice daily. No changes in DuoNeb, continue 3 times daily as needed. We discussed some side effects of medications. No changes in Singulair. Follow-up in 4 weeks.  Allergic rhinitis She has several occupational trigger factors. She could not keep appt with immunologist due to cost. Recommend changing Zyrtec 10 mg for Allegra 180 mg. Continue Singulair 10 mg.   Cough Persistent. Lung auscultation today is negative, so I do not think we need to do imaging here in the office. Possible etiology discussed, including allergies, asthma, and GERD among some. PFTs will be arranged.  -     Pulmonary Function Test; Future     Return in about 4 weeks (around 09/10/2018).     Betty G. Martinique, MD  [facility]. [place] office.

## 2018-08-13 NOTE — Assessment & Plan Note (Signed)
She has several occupational trigger factors. She could not keep appt with immunologist due to cost. Recommend changing Zyrtec 10 mg for Allegra 180 mg. Continue Singulair 10 mg.

## 2018-08-13 NOTE — Assessment & Plan Note (Signed)
Her insurance does not cover with any inhaler. Today Pulmicort nebulizer solution was added to use twice daily. No changes in DuoNeb, continue 3 times daily as needed. We discussed some side effects of medications. No changes in Singulair. Follow-up in 4 weeks.

## 2018-08-15 ENCOUNTER — Ambulatory Visit (INDEPENDENT_AMBULATORY_CARE_PROVIDER_SITE_OTHER): Payer: 59 | Admitting: Internal Medicine

## 2018-08-15 ENCOUNTER — Other Ambulatory Visit: Payer: Self-pay

## 2018-08-15 DIAGNOSIS — R059 Cough, unspecified: Secondary | ICD-10-CM

## 2018-08-15 DIAGNOSIS — R05 Cough: Secondary | ICD-10-CM | POA: Diagnosis not present

## 2018-08-15 DIAGNOSIS — R0602 Shortness of breath: Secondary | ICD-10-CM

## 2018-08-15 LAB — PULMONARY FUNCTION TEST
DL/VA % pred: 120 %
DL/VA: 5.05 ml/min/mmHg/L
DLCO UNC: 16.64 ml/min/mmHg
DLCO unc % pred: 83 %
FEF 25-75 Post: 1.61 L/sec
FEF 25-75 Pre: 0.87 L/sec
FEF2575-%Change-Post: 84 %
FEF2575-%Pred-Post: 81 %
FEF2575-%Pred-Pre: 44 %
FEV1-%CHANGE-POST: 20 %
FEV1-%Pred-Post: 65 %
FEV1-%Pred-Pre: 54 %
FEV1-Post: 1.33 L
FEV1-Pre: 1.1 L
FEV1FVC-%Change-Post: 11 %
FEV1FVC-%PRED-PRE: 96 %
FEV6-%Change-Post: 8 %
FEV6-%Pred-Post: 63 %
FEV6-%Pred-Pre: 58 %
FEV6-Post: 1.57 L
FEV6-Pre: 1.45 L
FEV6FVC-%PRED-POST: 103 %
FEV6FVC-%Pred-Pre: 103 %
FVC-%Change-Post: 8 %
FVC-%PRED-PRE: 56 %
FVC-%Pred-Post: 60 %
FVC-Post: 1.57 L
FVC-Pre: 1.45 L
Post FEV1/FVC ratio: 84 %
Post FEV6/FVC ratio: 100 %
Pre FEV1/FVC ratio: 76 %
Pre FEV6/FVC Ratio: 100 %
RV % pred: 98 %
RV: 2.01 L
TLC % PRED: 74 %
TLC: 3.75 L

## 2018-08-15 NOTE — Progress Notes (Signed)
PFT done today. 

## 2018-08-16 ENCOUNTER — Ambulatory Visit (INDEPENDENT_AMBULATORY_CARE_PROVIDER_SITE_OTHER)
Admission: RE | Admit: 2018-08-16 | Discharge: 2018-08-16 | Disposition: A | Discharge status: Home / Self Care | Payer: 59 | Source: Ambulatory Visit | Attending: Family Medicine | Admitting: Family Medicine

## 2018-08-16 DIAGNOSIS — R0602 Shortness of breath: Secondary | ICD-10-CM | POA: Diagnosis not present

## 2018-08-19 ENCOUNTER — Telehealth: Payer: Self-pay | Admitting: Family Medicine

## 2018-08-19 NOTE — Telephone Encounter (Signed)
Copied from [place] [id number]. Topic: Quick Communication - See Telephone Encounter >> Aug 19, 2018  3:24 PM Blase Mess A wrote: CRM for notification. See Telephone encounter for: 08/19/18.  Patient is having a hard time with colognes  and sensitivities she is wanting to discuss this with Dr.Jordan. It is causing her to cough really bad. And causing people around her to be alarmed. The patient also wanted to discuss a handicap sticker. Please advise. Thank you

## 2018-08-19 NOTE — Telephone Encounter (Signed)
Copied from [place] [id number]. Topic: Quick Communication - See Telephone Encounter >> Aug 19, 2018  3:20 PM Blase Mess A wrote: CRM for notification. See Telephone encounter for: 08/19/18.  Patient is calling to get results for breathing and CT scan. Please advise [phone]

## 2018-08-20 NOTE — Telephone Encounter (Signed)
Message sent to Dr. Jordan for review. 

## 2018-08-20 NOTE — Telephone Encounter (Signed)
Spoke with patient and she stated that her job is starting to use different cleaning supplies and she is having trouble with all the different scents and she is questioning whether or not she should take time off of work to get better since her symptoms haven't really gotten any better. Patient stated that she moments where she cough a whole lot. Would like PCP advice about work situation.

## 2018-08-20 NOTE — Telephone Encounter (Signed)
We have done this in the past, every time she goes back she starts with respiratory symptoms. The ideal solution is to avoids exposure to known trigger factors. If she wants to take some days ,she can do so.  Thanks, BJ

## 2018-08-21 NOTE — Telephone Encounter (Signed)
Chest CT and PFT were already reviewed and recommendations given. Betty Martinique, MD

## 2018-08-22 NOTE — Telephone Encounter (Addendum)
Patient stated she was waiting on a call back from Dr. Martinique, but hadn't received one. Dr. Martinique did answer back but no one called the patient to give her the answer.  Patient also wants to know where to get new tubing for her Nebulizer, because hers have a hole in it.  Patient is also requesting a call back tomorrow, 08/23/2018 around 9am.

## 2018-08-23 ENCOUNTER — Encounter: Payer: Self-pay | Admitting: *Deleted

## 2018-08-23 NOTE — Telephone Encounter (Signed)
Patient given recommendations per Dr. Martinique. Patient verbalized understanding. Work excuse provided for patient, patient will pick up note and tubing today, will call and let me know that she is outside. Nothing further needed at this time.

## 2018-08-27 ENCOUNTER — Telehealth: Payer: Self-pay

## 2018-08-27 NOTE — Telephone Encounter (Signed)
   Primary Cardiologist: Peter Martinique, MD  Patient contacted.  History reviewed.  No symptoms to suggest any unstable cardiac conditions.  Based on discussion, with current pandemic situation, patient wants postpone this appointment with Cecilie Kicks, NP with a plan for f/u in May or reschedule when necessary. Patient decline to do a virtual visit at this time. If symptoms change, she has been instructed to contact our office.   I have sent an e-mail to patient's confirmed e-mail address on file to set up [place]. Pt verbalized understanding and thanked me for the call.   Routing to C19 CANCEL pool for tracking (P CV DIV CV19 CANCEL - reason for visit "other.") and assigning priority (1 = 4-6 wks, 2 = 6-12 wks, 3 = >12 wks).   Jacinta Shoe, CMA  08/27/2018 11:54 AM         .          .

## 2018-08-29 ENCOUNTER — Telehealth: Payer: Self-pay | Admitting: *Deleted

## 2018-08-29 NOTE — Telephone Encounter (Signed)
Copied from [place] [id number]. Topic: General - Inquiry >> Aug 29, 2018  2:43 PM Rutherford Nail, NT wrote: Reason for CRM:  Patient calling and would like to know if Dr Martinique could write a letter stating that the cleaning products that she and her colleagues are using at work, she is sensitive to. States that the scents and certain products cause her to have coughing fits. States that she is impacted by the cleaning products. Would like to know if this could be emailed to her. Please advise. [email or web address]

## 2018-08-30 ENCOUNTER — Encounter: Payer: Self-pay | Admitting: *Deleted

## 2018-08-30 NOTE — Telephone Encounter (Signed)
It is Ok to provide letter for her. Due to allergies (allergic rhinitis and asthma) she needs to avoid exposure to identified triggers as those she is describing.  Thanks, BJ

## 2018-08-30 NOTE — Telephone Encounter (Signed)
Message sent to Dr. Jordan for review and approval. 

## 2018-09-02 ENCOUNTER — Ambulatory Visit: Payer: Self-pay | Admitting: *Deleted

## 2018-09-02 NOTE — Telephone Encounter (Signed)
Letter has been completed and available through my chart for patient to print out.

## 2018-09-02 NOTE — Telephone Encounter (Signed)
Patient is reporting that she is having some respiratory symptoms. Patient states her symptoms are worse. Patient had coughing fit last week- she is suffering with trying to get better and she has applied for leave- but every time she returns to work- she gets worse. Patient is not sure how to resolve her breathing problems. Patient feels like she is trapped in her apartment- she is having reaction to pollen and then at work she is coughing. Patient feels that the medication is just helping her from day to day- but this is "no way to live"   Patient tried to go to work- but people are spraying Lysol and using other chemicals and she just can't tolerate it.Patient is requesting emergency leave.   Reason for Disposition . SEVERE coughing spells (e.g., whooping sound after coughing, vomiting after coughing)  Answer Assessment - Initial Assessment Questions 1. ONSET: "When did the cough begin?"      Several weeks 2. SEVERITY: "How bad is the cough today?"      Patient is using nebulizer to breath 3. RESPIRATORY DISTRESS: "Describe your breathing."      Patient is depending on inhaler to breath 4. FEVER: "Do you have a fever?" If so, ask: "What is your temperature, how was it measured, and when did it start?"     No- when coughing a lot patient sweats 5. HEMOPTYSIS: "Are you coughing up any blood?" If so ask: "How much?" (flecks, streaks, tablespoons, etc.)     no 6. TREATMENT: "What have you done so far to treat the cough?" (e.g., meds, fluids, humidifier)     Inhaler, nebulizer, tylenol 7. CARDIAC HISTORY: "Do you have any history of heart disease?" (e.g., heart attack, congestive heart failure)      no 8. LUNG HISTORY: "Do you have any history of lung disease?"  (e.g., pulmonary embolus, asthma, emphysema)    Childhood asthma 9. PE RISK FACTORS: "Do you have a history of blood clots?" (or: recent major surgery, recent prolonged travel, bedridden)   no  10. OTHER SYMPTOMS: "Do you have any  other symptoms? (e.g., runny nose, wheezing, chest pain)       Constant cough- causing patient not to sleep- patient is stating she is having trouble at work- she feels she is actually worse with her coughing 11. PREGNANCY: "Is there any chance you are pregnant?" "When was your last menstrual period?"       n/a 12. TRAVEL: "Have you traveled out of the country in the last month?" (e.g., travel history, exposures)       No travel- no exposure known  Protocols used: COUGH - ACUTE NON-PRODUCTIVE-A-AH

## 2018-09-03 ENCOUNTER — Telehealth: Payer: Self-pay | Admitting: *Deleted

## 2018-09-03 NOTE — Telephone Encounter (Signed)
Spoke with patient and she stated that her job sent her home due to her coughing and told her about the emergency leave that she has applied for. Patient stated that her symptoms are getting worse. She has appointment scheduled for 09/09/2018 to discuss with PCP.

## 2018-09-03 NOTE — Telephone Encounter (Signed)
Copied from [id number]. Topic: General - Inquiry >> Sep 03, 2018 10:20 AM Alanda Slim E wrote: Reason for CRM: Pt needs a letter stating that she has a respiratory issue and she is unable to deal with harsh chemicals and work due to being high risk/ pt needs to get this info to her HR asap so she can still be paid for work. / Pt also states she still has a lingering cough/ please advise >> Sep 03, 2018 12:59 PM Selinda Flavin B, NT wrote: Patient calling to check status of getting a new letter for work. States that she has a lingering cough that doesn't seem to be getting better. States that she is still taking the inhaler and the other medication that Dr Martinique prescribed, but improvements are going slow. Please advise. Would like to know if she needs to be reevaluated or not?

## 2018-09-03 NOTE — Telephone Encounter (Signed)
Message sent to Dr. Jordan for review and approval. 

## 2018-09-03 NOTE — Telephone Encounter (Signed)
I think I answered similar message earlier. If she is having acute respiratory symptoms and getting worse,she needs to seek medical attention.  In regard to letter. We have done this a few times already. I have explained that symptoms will re-occur once she is exposed trigger factors. It seems like every time she goes back to work she is developing symptoms, so a letter is not an option.  She may want to request employer to be relocated to a different area where she may be less exposed to allergens,if possible.  I recommended immunology evaluation in the past but due to cost she could not keep appt.  Because her health insurance does not cover inhaler,she is on Pulmicort and Duoneb neb treatments. She has been treated with Prednisone a few times this year. Symptoms are not well controlled, so I recommend pulmonology referral.  Thanks, BJ

## 2018-09-03 NOTE — Telephone Encounter (Signed)
If trigger factor is occupation exposure she is going to have symptoms again when she goes back,so I am not sure a "emergency leave" is the solution and will help temporarily. Symptoms need to be better controlled. We have some limitations, health insurance does not cover inhalers.  Referral to pulmonologist can be placed.  If she is having acute symptoms and getting worse she may need to go to acute care.  Thanks, BJ

## 2018-09-04 ENCOUNTER — Ambulatory Visit (INDEPENDENT_AMBULATORY_CARE_PROVIDER_SITE_OTHER): Payer: 59 | Admitting: Family Medicine

## 2018-09-04 ENCOUNTER — Encounter: Payer: Self-pay | Admitting: *Deleted

## 2018-09-04 ENCOUNTER — Encounter: Payer: Self-pay | Admitting: Family Medicine

## 2018-09-04 ENCOUNTER — Other Ambulatory Visit: Payer: Self-pay

## 2018-09-04 VITALS — BP 140/90 | HR 88 | Resp 16

## 2018-09-04 DIAGNOSIS — I10 Essential (primary) hypertension: Secondary | ICD-10-CM

## 2018-09-04 DIAGNOSIS — R0602 Shortness of breath: Secondary | ICD-10-CM

## 2018-09-04 DIAGNOSIS — J309 Allergic rhinitis, unspecified: Secondary | ICD-10-CM

## 2018-09-04 DIAGNOSIS — J4521 Mild intermittent asthma with (acute) exacerbation: Secondary | ICD-10-CM

## 2018-09-04 DIAGNOSIS — IMO0001 Reserved for inherently not codable concepts without codable children: Secondary | ICD-10-CM

## 2018-09-04 NOTE — Progress Notes (Signed)
Virtual Visit via Video Note   I connected with Alejandra Vega on 09/04/18 at  2:00 PM EDT by a video enabled telemedicine application and verified that I am speaking with the correct person using two identifiers.  Location patient: home Location provider:home office Persons participating in the virtual visit: patient, provider  I discussed the limitations of evaluation and management by telemedicine and the availability of in person appointments. The patient expressed understanding and agreed to proceed.   HPI: Alejandra Vega is being seen today to follow on her last OV, 08/13/18. For months now she has had cough,wheezing,and dyspnea. No Hx of tobacco use. PFT's 08/15/18 showed moderately severe obstructive airway disease, asthma vs COPD.  There has been issues with compliance and also some of recommended meds are not covered by health insurance.  Symptoms are exacerbated by exposure to fumes,dust,pollen,and detergents among some. She works at [employer], where there are some trigger factors.But for the past couple week she has symptoms "every time she goes out." Coughing spells, occasional "little" clear sputum,denies hemoptysis.   Symptoms improve but do not resolve during days she is off.  She is on Duoneb neb treatment bid prn, since her last OV she used GoodRx card, she got Albuterol inh. She c/o getting "hyper" and insomnia when she uses inhaler. Last OV Pulmicort sl to use bid was added but she did not started.  Nasal congestion,rhinorrhea,and post nasal drainage intermittently. Symptoms exacerbated by pollen and detergents. Nasal irrigations with saline help. She is on Singulair 10 mg daily, Zyrtec 10 mg daily,and Astelin nasal spray.   She was send home last week and according to pt,she is not allowed to go back until symptoms improve. She is feeling better today. Denies fever,chills, body aches. Negative for sick exposure or recent travel.  Immunology referral was placed a  few months ago but she could not afford copay.  She is requesting a letter ,so she can stay home and still get paid. We have done this before but symptoms developed when she goes back to work. She would like to have 30 days leave.  BP has been "little" high. 140's/80, which she attributes to coughing spells. When she is not coughing BP is better. She was on Amlodipine but discontinued because she wanted to try non pharmacologic treatment. Negative for headache,visual changes, CP,or edema.  Lab Results  Component Value Date   CREATININE 0.81 10/18/2017   BUN 12 10/18/2017   NA 139 10/18/2017   K 4.5 10/18/2017   CL 101 10/18/2017   CO2 31 10/18/2017    ROS: See pertinent positives and negatives per HPI.  Past Medical History:  Diagnosis Date  . Allergy   . Anemia    with pregnancy   . Anxiety   . Asthma    as child/ severe  . Gastric ulcer 2013  . Heart murmur   . Hypertension    no meds/ due to stress    Past Surgical History:  Procedure Laterality Date  . DILATION AND CURETTAGE OF UTERUS     2 times  . miscarriages     had 2  . PILONIDAL CYST EXCISION  1988   2 times  . WISDOM TOOTH EXTRACTION     age 48    Family History  Problem Relation Age of Onset  . Hypertension Mother   . Heart disease Father   . Alcohol abuse Father   . Breast cancer Maternal Aunt   . Colon cancer Neg Hx   .  Colon polyps Neg Hx   . Esophageal cancer Neg Hx   . Rectal cancer Neg Hx   . Stomach cancer Neg Hx     Social History   Socioeconomic History  . Marital status: Legally Separated    Spouse name: Not on file  . Number of children: Not on file  . Years of education: Not on file  . Highest education level: Not on file  Occupational History  . Not on file  Social Needs  . Financial resource strain: Not on file  . Food insecurity:    Worry: Not on file    Inability: Not on file  . Transportation needs:    Medical: Not on file    Non-medical: Not on file  Tobacco  Use  . Smoking status: Never Smoker  . Smokeless tobacco: Never Used  Substance and Sexual Activity  . Alcohol use: No  . Drug use: No  . Sexual activity: Not Currently  Lifestyle  . Physical activity:    Days per week: Not on file    Minutes per session: Not on file  . Stress: Not on file  Relationships  . Social connections:    Talks on phone: Not on file    Gets together: Not on file    Attends religious service: Not on file    Active member of club or organization: Not on file    Attends meetings of clubs or organizations: Not on file    Relationship status: Not on file  . Intimate partner violence:    Fear of current or ex partner: Not on file    Emotionally abused: Not on file    Physically abused: Not on file    Forced sexual activity: Not on file  Other Topics Concern  . Not on file  Social History Narrative  . Not on file      Current Outpatient Medications:  .  acetaminophen (TYLENOL) 500 MG tablet, Take 500 mg by mouth as needed., Disp: , Rfl:  .  azelastine (ASTELIN) 0.1 % nasal spray, Place 2 sprays into both nostrils 2 (two) times daily. Use in each nostril as directed, Disp: 30 mL, Rfl: 6 .  budesonide (PULMICORT) 0.5 MG/2ML nebulizer solution, Take 2 mLs (0.5 mg total) by nebulization 2 (two) times daily., Disp: 60 mL, Rfl: 3 .  cetirizine (ZYRTEC) 10 MG tablet, Take 10 mg by mouth daily., Disp: , Rfl:  .  Cyanocobalamin (VITAMIN B 12 PO), Take by mouth daily., Disp: , Rfl:  .  diphenhydrAMINE (BENADRYL) 25 MG tablet, Take 25 mg by mouth as needed., Disp: , Rfl:  .  ipratropium-albuterol (DUONEB) 0.5-2.5 (3) MG/3ML SOLN, Take 3 mLs by nebulization every 8 (eight) hours as needed., Disp: 360 mL, Rfl: 1 .  meclizine (ANTIVERT) 25 MG tablet, Take 1 tablet (25 mg total) by mouth 3 (three) times daily as needed for dizziness., Disp: 45 tablet, Rfl: 0 .  montelukast (SINGULAIR) 10 MG tablet, Take 1 tablet (10 mg total) by mouth at bedtime., Disp: 90 tablet, Rfl:  2 .  omeprazole (PRILOSEC) 20 MG capsule, Take 1 capsule (20 mg total) by mouth daily for 30 days., Disp: 30 capsule, Rfl: 0 .  Spacer/Aero-Holding Chambers (AEROCHAMBER PLUS) inhaler, Use as instructed, Disp: 1 each, Rfl: 2  Current Facility-Administered Medications:  .  0.9 %  sodium chloride infusion, 500 mL, Intravenous, Once, Pyrtle, Lajuan Lines, MD  EXAM:  VITALS per patient if applicable.:BP 518/84   Pulse 88  Resp 16   GENERAL: alert, oriented, appears well and in no acute distress  HEENT: atraumatic, conjunctiva clear, no obvious abnormalities on inspection of face.  NECK: normal movements of the head and neck  LUNGS: on inspection no signs of respiratory distress, breathing rate appears normal, no obvious gross SOB, gasping or wheezing  CV: no obvious cyanosis  Alejandra: moves all visible extremities without noticeable abnormality  PSYCH/NEURO: pleasant and cooperative, no obvious depression or anxiety, speech and thought processing grossly intact  ASSESSMENT AND PLAN:  Discussed the following assessment and plan: Orders Placed This Encounter  Procedures  . Ambulatory referral to Pulmonology     Essential hypertension, benign She has had some elevated BP, which she attributes to coughing spells but reporting "good" BP readings when she is not coughing. Recommend continue monitoring BP periodically. She is interested in pharmacologic treatment. Continue low-salt diet. Follow-up in 5 to 6 months.  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 Today she is feeling better but in general symptoms not well controlled, compliance is also a problem. Recommend calling her pharmacy to find out about the Pulmicort. Instructed to avoid using albuterol inhaler and DuoNeb at the same time, side effect discussed. Pulmonology referral placed.    Allergic rhinitis Stable. Recommend trying to avoid trigger factors. Pending appt with immunologist,I think she may benefit from  immunotherapy. No changes in Zyrtec,Singulair,or Astelin nasal spray. Continue nasal saline irrigations.  Shortness of breath Chronic problem. Cardiac work up has been negative. Appt with cardiologist has been postpone due to current public health concerns about COVID-19 pandemic.  Instructed about warning signs.   Today we also reviewed recent chest CT 08/16/18. Mild bibasilar atelectasis or scarring. There is some geographic attenuation of the airspaces suggestive of small airways disease. No evidence of fibrotic interstitial lung disease. Cardiomegaly. There appears to be narrowing of the right brachiocephalic vein and superficial venous collateralization about the anterior and right chest wall.  I do not think further studies are needed at this time.  I discussed the assessment and treatment plan with the patient. She was provided an opportunity to ask questions and all were answered. The patient agreed with the plan and demonstrated an understanding of the instructions.   The patient was advised to call back or seek an in-person evaluation if the symptoms worsen or if the condition fails to improve as anticipated.  Return in about 6 months (around 03/06/2019) for htn.     [place], MD

## 2018-09-04 NOTE — Telephone Encounter (Signed)
Patient given recommendations per Dr. Martinique and verbalized understanding. Patient states that she doesn't agree with recommendations. Has appointment to discuss concerns on Monday with PCP.

## 2018-09-04 NOTE — Assessment & Plan Note (Signed)
Chronic problem. Cardiac work up has been negative. Appt with cardiologist has been postpone due to current public health concerns about COVID-19 pandemic.  Instructed about warning signs.

## 2018-09-04 NOTE — Assessment & Plan Note (Signed)
She has had some elevated BP, which she attributes to coughing spells but reporting "good" BP readings when she is not coughing. Recommend continue monitoring BP periodically. She is interested in pharmacologic treatment. Continue low-salt diet. Follow-up in 5 to 6 months.

## 2018-09-04 NOTE — Assessment & Plan Note (Signed)
Stable. Recommend trying to avoid trigger factors. Pending appt with immunologist,I think she may benefit from immunotherapy. No changes in Zyrtec,Singulair,or Astelin nasal spray. Continue nasal saline irrigations.

## 2018-09-04 NOTE — Telephone Encounter (Signed)
Visit completed. Yesena Reaves Martinique, MD

## 2018-09-04 NOTE — Telephone Encounter (Signed)
Patient rescheduled for today at 2 pm to discuss concerns.

## 2018-09-04 NOTE — Assessment & Plan Note (Signed)
Vs COPD. Today she is feeling better but in general symptoms not well controlled, compliance is also a problem. Recommend calling her pharmacy to find out about the Pulmicort. Instructed to avoid using albuterol inhaler and DuoNeb at the same time, side effect discussed. Pulmonology referral placed.

## 2018-09-09 ENCOUNTER — Ambulatory Visit: Payer: 59 | Admitting: Family Medicine

## 2018-09-11 ENCOUNTER — Telehealth: Payer: Self-pay | Admitting: *Deleted

## 2018-09-11 NOTE — Telephone Encounter (Signed)
What is the question? BJ

## 2018-09-11 NOTE — Telephone Encounter (Signed)
Please advise 

## 2018-09-16 ENCOUNTER — Ambulatory Visit: Payer: 59 | Admitting: Cardiology

## 2018-09-16 ENCOUNTER — Telehealth: Payer: Self-pay

## 2018-09-16 NOTE — Telephone Encounter (Signed)
Copied from [place] [id number]. Topic: General - Other >> Sep 16, 2018 10:39 AM Richardo Priest, NT wrote: Reason for CRM: Patient called in and is wondering if Dr.Jordan would write a prescription for an inhaler of a stronger dosage. Patient states they talked about it in last appointment. Call back at [phone]

## 2018-09-16 NOTE — Telephone Encounter (Signed)
Message sent to Dr. Jordan for review and approval. 

## 2018-09-24 ENCOUNTER — Telehealth: Payer: Self-pay

## 2018-09-24 NOTE — Telephone Encounter (Signed)
YOUR CARDIOLOGY TEAM HAS ARRANGED FOR AN E-VISIT FOR YOUR APPOINTMENT - PLEASE REVIEW IMPORTANT INFORMATION BELOW SEVERAL DAYS PRIOR TO YOUR APPOINTMENT  Due to the recent COVID-19 pandemic, we are transitioning in-person office visits to tele-medicine visits in an effort to decrease unnecessary exposure to our patients, their families, and staff. These visits are billed to your insurance just like a normal visit is. We also encourage you to sign up for MyChart if you have not already done so. You will need a smartphone if possible. For patients that do not have this, we can still complete the visit using a regular telephone but do prefer a smartphone to enable video when possible. You may have a family member that lives with you that can help. If possible, we also ask that you have a blood pressure cuff and scale at home to measure your blood pressure, heart rate and weight prior to your scheduled appointment. Patients with clinical needs that need an in-person evaluation and testing will still be able to come to the office if absolutely necessary. If you have any questions, feel free to call our office.     YOUR PROVIDER WILL BE USING THE FOLLOWING PLATFORM TO COMPLETE YOUR VISIT: Doximity  . IF USING MYCHART - How to Download the MyChart App to Your SmartPhone   - If Apple, go to App Store and type in MyChart in the search bar and download the app. If Android, ask patient to go to Google Play Store and type in MyChart in the search bar and download the app. The app is free but as with any other app downloads, your phone may require you to verify saved payment information or Apple/Android password.  - You will need to then log into the app with your MyChart username and password, and select [facility] as your healthcare provider to link the account.  - When it is time for your visit, go to the MyChart app, find appointments, and click Begin Video Visit. Be sure to Select Allow for your device to  access the Microphone and Camera for your visit. You will then be connected, and your provider will be with you shortly.  **If you have any issues connecting or need assistance, please contact MyChart service desk [phone] ([phone])**  **If using a computer, in order to ensure the best quality for your visit, you will need to use either of the following Internet Browsers: Google Chrome or Microsoft Edge**  . IF USING DOXIMITY or [email or web address].ME - The staff will give you instructions on receiving your link to join the meeting the day of your visit.      2-3 DAYS BEFORE YOUR APPOINTMENT  You will receive a telephone call from one of our [facility] team members - your caller ID may say "Unknown caller." If this is a video visit, we will walk you through how to get the video launched on your phone. We will remind you check your blood pressure, heart rate and weight prior to your scheduled appointment. If you have an Apple Watch or Kardia, please upload any pertinent ECG strips the day before or morning of your appointment to MyChart. Our staff will also make sure you have reviewed the consent and agree to move forward with your scheduled tele-health visit.     THE DAY OF YOUR APPOINTMENT  Approximately 15 minutes prior to your scheduled appointment, you will receive a telephone call from one of [facility] team - your caller ID may say "Unknown caller."    Our staff will confirm medications, vital signs for the day and any symptoms you may be experiencing. Please have this information available prior to the time of visit start. It may also be helpful for you to have a pad of paper and pen handy for any instructions given during your visit. They will also walk you through joining the smartphone meeting if this is a video visit.    CONSENT FOR TELE-HEALTH VISIT - PLEASE REVIEW  I hereby voluntarily request, consent and authorize [facility] and its employed or contracted physicians, physician  assistants, nurse practitioners or other licensed health care professionals (the Practitioner), to provide me with telemedicine health care services (the "Services") as deemed necessary by the treating Practitioner. I acknowledge and consent to receive the Services by the Practitioner via telemedicine. I understand that the telemedicine visit will involve communicating with the Practitioner through live audiovisual communication technology and the disclosure of certain medical information by electronic transmission. I acknowledge that I have been given the opportunity to request an in-person assessment or other available alternative prior to the telemedicine visit and am voluntarily participating in the telemedicine visit.  I understand that I have the right to withhold or withdraw my consent to the use of telemedicine in the course of my care at any time, without affecting my right to future care or treatment, and that the Practitioner or I may terminate the telemedicine visit at any time. I understand that I have the right to inspect all information obtained and/or recorded in the course of the telemedicine visit and may receive copies of available information for a reasonable fee.  I understand that some of the potential risks of receiving the Services via telemedicine include:  . Delay or interruption in medical evaluation due to technological equipment failure or disruption; . Information transmitted may not be sufficient (e.g. poor resolution of images) to allow for appropriate medical decision making by the Practitioner; and/or  . In rare instances, security protocols could fail, causing a breach of personal health information.  Furthermore, I acknowledge that it is my responsibility to provide information about my medical history, conditions and care that is complete and accurate to the best of my ability. I acknowledge that Practitioner's advice, recommendations, and/or decision may be based on  factors not within their control, such as incomplete or inaccurate data provided by me or distortions of diagnostic images or specimens that may result from electronic transmissions. I understand that the practice of medicine is not an exact science and that Practitioner makes no warranties or guarantees regarding treatment outcomes. I acknowledge that I will receive a copy of this consent concurrently upon execution via email to the email address I last provided but may also request a printed copy by calling the office of [facility].    I understand that my insurance will be billed for this visit.   I have read or had this consent read to me. . I understand the contents of this consent, which adequately explains the benefits and risks of the Services being provided via telemedicine.  . I have been provided ample opportunity to ask questions regarding this consent and the Services and have had my questions answered to my satisfaction. . I give my informed consent for the services to be provided through the use of telemedicine in my medical care  By participating in this telemedicine visit I agree to the above.  

## 2018-09-25 NOTE — Telephone Encounter (Signed)
Options is to replace Pulmicort for Advair,Dulera,Breo.And/or to replace Duoneb for Combivent. Also pending appt with pulmonologist. Arvella Nigh, BJ

## 2018-09-26 ENCOUNTER — Encounter: Payer: Self-pay | Admitting: Cardiology

## 2018-09-26 ENCOUNTER — Other Ambulatory Visit: Payer: Self-pay

## 2018-09-26 ENCOUNTER — Telehealth (INDEPENDENT_AMBULATORY_CARE_PROVIDER_SITE_OTHER): Payer: 59 | Admitting: Cardiology

## 2018-09-26 VITALS — BP 130/82 | Ht 64.0 in | Wt 247.0 lb

## 2018-09-26 DIAGNOSIS — I10 Essential (primary) hypertension: Secondary | ICD-10-CM

## 2018-09-26 DIAGNOSIS — Z7189 Other specified counseling: Secondary | ICD-10-CM

## 2018-09-26 DIAGNOSIS — I119 Hypertensive heart disease without heart failure: Secondary | ICD-10-CM | POA: Insufficient documentation

## 2018-09-26 DIAGNOSIS — E66813 Obesity, class 3: Secondary | ICD-10-CM | POA: Insufficient documentation

## 2018-09-26 DIAGNOSIS — E782 Mixed hyperlipidemia: Secondary | ICD-10-CM

## 2018-09-26 DIAGNOSIS — I517 Cardiomegaly: Secondary | ICD-10-CM

## 2018-09-26 MED ORDER — ATORVASTATIN CALCIUM 20 MG PO TABS: 20.0000 mg | ORAL_TABLET | Freq: Every day | ORAL | 3 refills | Status: AC

## 2018-09-26 NOTE — Patient Instructions (Addendum)
Medication Instructions: START: Atorvastatin 20 mg once a day  If you need a refill on your cardiac medications before your next appointment, please call your pharmacy.   Lab work: FUTURE: Lipids to be done in 3 months by your primary care provider   If you have labs (blood work) drawn today and your tests are completely normal, you will receive your results only by:  [place] (if you have MyChart) OR  A paper copy in the mail If you have any lab test that is abnormal or we need to change your treatment, we will call you to review the results.  Testing/Procedures: None  Follow-Up: Your physician wants you to follow-up in: 6 months with Pecolia Ades. NP You will receive a reminder letter in the mail two months in advance. If you don't receive a letter, please call our office to schedule the follow-up appointment.   Any Other Special Instructions Will Be Listed Below (If Applicable).  Lifestyle Modifications to Prevent and Treat Heart Disease -Recommend heart healthy/Mediterranean diet, with whole grains, fruits, vegetable, fish, lean meats, nuts, and olive oil.  -Limit salt. -Recommend moderate walking, 3-5 times/week for 30-50 minutes each session. Aim for at least 150 minutes.week. Goal should be pace of 3 miles/hours, or walking 1.5 miles in 30 minutes -Recommend avoidance of tobacco products. Avoid excess alcohol. -Target blood pressure of 130/80 or less. Call if running consistently over 135/85.  -Work on weight loss with overall goal of losing 75 lbs. Target losing 20 pounds over then next 6 months to a year.     Mediterranean Diet A Mediterranean diet refers to food and lifestyle choices that are based on the traditions of countries located on the [employer]. This way of eating has been shown to help prevent certain conditions and improve outcomes for people who have chronic diseases, like kidney disease and heart disease. What are tips for following this  plan? Lifestyle  Cook and eat meals together with your family, when possible.  Drink enough fluid to keep your urine clear or pale yellow.  Be physically active every day. This includes: ? Aerobic exercise like running or swimming. ? Leisure activities like gardening, walking, or housework.  Get 7-8 hours of sleep each night.  If recommended by your health care provider, drink red wine in moderation. This means 1 glass a day for nonpregnant women and 2 glasses a day for men. A glass of wine equals 5 oz (150 mL). Reading food labels   Check the serving size of packaged foods. For foods such as rice and pasta, the serving size refers to the amount of cooked product, not dry.  Check the total fat in packaged foods. Avoid foods that have saturated fat or trans fats.  Check the ingredients list for added sugars, such as corn syrup. Shopping  At the grocery store, buy most of your food from the areas near the walls of the store. This includes: ? Fresh fruits and vegetables (produce). ? Grains, beans, nuts, and seeds. Some of these may be available in unpackaged forms or large amounts (in bulk). ? Fresh seafood. ? Poultry and eggs. ? Low-fat dairy products.  Buy whole ingredients instead of prepackaged foods.  Buy fresh fruits and vegetables in-season from local farmers markets.  Buy frozen fruits and vegetables in resealable bags.  If you do not have access to quality fresh seafood, buy precooked frozen shrimp or canned fish, such as tuna, salmon, or sardines.  Buy small amounts of raw  or cooked vegetables, salads, or olives from the deli or salad bar at your store.  Stock your pantry so you always have certain foods on hand, such as olive oil, canned tuna, canned tomatoes, rice, pasta, and beans. Cooking  Cook foods with extra-virgin olive oil instead of using butter or other vegetable oils.  Have meat as a side dish, and have vegetables or grains as your main dish. This  means having meat in small portions or adding small amounts of meat to foods like pasta or stew.  Use beans or vegetables instead of meat in common dishes like chili or lasagna.  Experiment with different cooking methods. Try roasting or broiling vegetables instead of steaming or sauteing them.  Add frozen vegetables to soups, stews, pasta, or rice.  Add nuts or seeds for added healthy fat at each meal. You can add these to yogurt, salads, or vegetable dishes.  Marinate fish or vegetables using olive oil, lemon juice, garlic, and fresh herbs. Meal planning   Plan to eat 1 vegetarian meal one day each week. Try to work up to 2 vegetarian meals, if possible.  Eat seafood 2 or more times a week.  Have healthy snacks readily available, such as: ? Vegetable sticks with hummus. ? [place] yogurt. ? Fruit and nut trail mix.  Eat balanced meals throughout the week. This includes: ? Fruit: 2-3 servings a day ? Vegetables: 4-5 servings a day ? Low-fat dairy: 2 servings a day ? Fish, poultry, or lean meat: 1 serving a day ? Beans and legumes: 2 or more servings a week ? Nuts and seeds: 1-2 servings a day ? Whole grains: 6-8 servings a day ? Extra-virgin olive oil: 3-4 servings a day  Limit red meat and sweets to only a few servings a month What are my food choices?  Mediterranean diet ? Recommended ? Grains: Whole-grain pasta. Brown rice. Bulgar wheat. Polenta. Couscous. Whole-wheat bread. [place]. ? Vegetables: Artichokes. Beets. Broccoli. Cabbage. Carrots. Eggplant. Green beans. Chard. Kale. Spinach. Onions. Leeks. Peas. Squash. Tomatoes. Peppers. Radishes. ? Fruits: Apples. Apricots. Avocado. Berries. Bananas. Cherries. Dates. Figs. Grapes. Lemons. Melon. Oranges. Peaches. Plums. Pomegranate. ? Meats and other protein foods: Beans. Almonds. Sunflower seeds. Pine nuts. Peanuts. [place]. Salmon. Scallops. Shrimp. [place]. Tilapia. Clams. Oysters. Eggs. ? Dairy: Low-fat milk. Cheese.  Greek yogurt. ? Beverages: Water. Red wine. Herbal tea. ? Fats and oils: Extra virgin olive oil. Avocado oil. Grape seed oil. ? Sweets and desserts: [place] yogurt with honey. Baked apples. Poached pears. Trail mix. ? Seasoning and other foods: Basil. Cilantro. Coriander. Cumin. Mint. Parsley. Sage. Rosemary. Tarragon. Garlic. Oregano. Thyme. Pepper. Balsalmic vinegar. Tahini. Hummus. Tomato sauce. Olives. Mushrooms. ? Limit these ? Grains: Prepackaged pasta or rice dishes. Prepackaged cereal with added sugar. ? Vegetables: Deep fried potatoes (french fries). ? Fruits: Fruit canned in syrup. ? Meats and other protein foods: Beef. Pork. Lamb. Poultry with skin. Hot dogs. [place] Salines. ? Dairy: Ice cream. Sour cream. Whole milk. ? Beverages: Juice. Sugar-sweetened soft drinks. Beer. Liquor and spirits. ? Fats and oils: Butter. Canola oil. Vegetable oil. Beef fat (tallow). Lard. ? Sweets and desserts: Cookies. Cakes. Pies. Candy. ? Seasoning and other foods: Mayonnaise. Premade sauces and marinades. ? The items listed may not be a complete list. Talk with your dietitian about what dietary choices are right for you. Summary  The Mediterranean diet includes both food and lifestyle choices.  Eat a variety of fresh fruits and vegetables, beans, nuts, seeds, and whole grains.  Limit the amount of red meat and sweets that you eat.  Talk with your health care provider about whether it is safe for you to drink red wine in moderation. This means 1 glass a day for nonpregnant women and 2 glasses a day for men. A glass of wine equals 5 oz (150 mL). This information is not intended to replace advice given to you by your health care provider. Make sure you discuss any questions you have with your health care provider. Document Released: 01/06/2016 Document Revised: 02/08/2016 Document Reviewed: 01/06/2016 Elsevier Interactive Patient Education  2019 [employer].

## 2018-09-26 NOTE — Progress Notes (Signed)
Virtual Visit via Video Note   This visit type was conducted due to national recommendations for restrictions regarding the COVID-19 Pandemic (e.g. social distancing) in an effort to limit this patient's exposure and mitigate transmission in our community.  Due to her co-morbid illnesses, this patient is at least at moderate risk for complications without adequate follow up.  This format is felt to be most appropriate for this patient at this time.  All issues noted in this document were discussed and addressed.  A limited physical exam was performed with this format.  Please refer to the patient's chart for her consent to telehealth for [facility].   Evaluation Performed:  Follow-up visit  Date:  09/26/2018   ID:  Alejandra Vega, DOB 12-Jul-1955, MRN [id number]  Patient Location: Home Provider Location: Home  PCP:  Martinique, Betty G, MD  Cardiologist:  No primary care provider on file. Daune Perch, NP Electrophysiologist:  None   Chief Complaint:  Follow up LVH, hypertension  History of Present Illness:    Alejandra Vega is a 63 y.o. female with hypertension, hyperlipidemia, obesity, asthma and GERD. She has been seen in the past by Dr. Curt Bears, last on 09/12/17. She was evaluated for EKG with diffuse T wave abnormalities and voltage criteria for LVH which was similar to prior EKG in 2017. Echo showed mod LVH. She was advised on aggressive blood pressure control.   She has been having trouble with coughing, wheezing and shortness of breath this spring and is being treated by PCP for moderate intermittent asthma vs COPD with inhalers, antihistamines and nasal spray.   Today Ms. Kleinman says that her cough has been better over the last week. She went to the health food store and bought ginger and turmeric tea. She still has some cough that is productive of small amt of white sputum. She is now able to lay down to sleep. She has cut out dairy as that seems to make her symptoms  worse. This has helped. She has been out of work since 4/2.   She has no palpitations, lightheadedness, edema.  She walks slowly and has DOE. Her activity is limited by her weight, shortness of breath and coughing.  She feels a mild pressure in chest like a tennis ball that improves after she has a coughing spell.   In January she could not lay down and she felt like her chest was gurgling. The gurgling resolved with a round of prednisone.   She reports BP 130's/81-82. She says she is managing her stress to control her BP. She took BP med for a while but this caused dizziness, trouble sleeping and she did not tolerate it well. She feels much better after she stopped taking it. She says she tend not to tolerate medications well.   The patient does not have symptoms concerning for COVID-19 infection (fever, chills, cough, or new shortness of breath).    Past Medical History:  Diagnosis Date   Allergy    Anemia    with pregnancy    Anxiety    Asthma    as child/ severe   Gastric ulcer 2013   Heart murmur    Hypertension    no meds/ due to stress   Past Surgical History:  Procedure Laterality Date   DILATION AND CURETTAGE OF UTERUS     2 times   miscarriages     had 2   PILONIDAL CYST EXCISION  1988   2 times  WISDOM TOOTH EXTRACTION     age 78     Current Meds  Medication Sig   acetaminophen (TYLENOL) 500 MG tablet Take 500 mg by mouth as needed.   budesonide (PULMICORT) 0.5 MG/2ML nebulizer solution Take 2 mLs (0.5 mg total) by nebulization 2 (two) times daily.   cetirizine (ZYRTEC) 10 MG tablet Take 10 mg by mouth daily.   Cyanocobalamin (VITAMIN B 12 PO) Take by mouth daily.   diphenhydrAMINE (BENADRYL) 25 MG tablet Take 25 mg by mouth as needed.   ipratropium-albuterol (DUONEB) 0.5-2.5 (3) MG/3ML SOLN Take 3 mLs by nebulization every 8 (eight) hours as needed.   meclizine (ANTIVERT) 25 MG tablet Take 1 tablet (25 mg total) by mouth 3 (three) times  daily as needed for dizziness.   montelukast (SINGULAIR) 10 MG tablet Take 1 tablet (10 mg total) by mouth at bedtime.   Current Facility-Administered Medications for the 09/26/18 encounter (Telemedicine) with Daune Perch, NP  Medication   0.9 %  sodium chloride infusion     Allergies:   Bee venom; Glutethimides; Penicillins; Shrimp [shellfish allergy]; Latex; Soy allergy; and Pork-derived products   Social History   Tobacco Use   Smoking status: Never Smoker   Smokeless tobacco: Never Used  Substance Use Topics   Alcohol use: No   Drug use: No     Family Hx: The patient's family history includes Alcohol abuse in her father; Breast cancer in her maternal aunt; Heart disease in her father; Hypertension in her mother. There is no history of Colon cancer, Colon polyps, Esophageal cancer, Rectal cancer, or Stomach cancer.  ROS:   Please see the history of present illness.     All other systems reviewed and are negative.   Prior CV studies:   The following studies were reviewed today:  Echocardiogram 03/22/2017 Study Conclusions  - Left ventricle: The cavity size was mildly dilated. Wall   thickness was increased in a pattern of moderate LVH. Systolic   function was normal. The estimated ejection fraction was in the   range of 55% to 60%. Wall motion was normal; there were no   regional wall motion abnormalities. Doppler parameters are   consistent with both elevated ventricular end-diastolic filling   pressure and elevated left atrial filling pressure. - Aortic valve: There was trivial regurgitation. - Left atrium: The atrium was moderately dilated. - Atrial septum: No defect or patent foramen ovale was identified.  Labs/Other Tests and Data Reviewed:    EKG:  An ECG dated 09/12/17 was personally reviewed today and demonstrated:  normal sinus rhythm with LVH with repolarization abnormality  Recent Labs: 10/18/2017: BUN 12; Creatinine, Ser 0.81; Potassium 4.5;  Sodium 139 07/17/2018: Hemoglobin 12.9; Platelets 269.0   Recent Lipid Panel Lab Results  Component Value Date/Time   CHOL 249 (H) 02/19/2018 09:21 AM   TRIG 144.0 02/19/2018 09:21 AM   HDL 52.30 02/19/2018 09:21 AM   CHOLHDL 5 02/19/2018 09:21 AM   LDLCALC 168 (H) 02/19/2018 09:21 AM    Wt Readings from Last 3 Encounters:  09/26/18 247 lb (112 kg)  08/13/18 247 lb 8 oz (112.3 kg)  07/17/18 247 lb (112 kg)     Objective:    Vital Signs:  BP 130/82    Ht 5\' 4"  (1.626 m)    Wt 247 lb (112 kg)    BMI 42.40 kg/m    VITAL SIGNS:  reviewed GEN:  no acute distress RESPIRATORY:  normal respiratory effort, symmetric expansion CARDIOVASCULAR:  no  peripheral edema NEURO:  alert and oriented x 3, no obvious focal deficit PSYCH:  normal affect  ASSESSMENT & PLAN:    1. Hypertension -Previously on amlodipine, but caused side effects so she stopped. BP fair control around 130/80.  -We discussed limiting salt and improving diet and exercise.  -Pt instructed on importance of BP control for heart health. She is very reluctant to start any medications. We had a lengthy discussion on lifestyle modifications including diet, exercise, limiting salt and wight loss.  Will consider adding HCTZ in the future if needed.   2. LVH: Echo 02/2017 showed normal EF with moderate LVH. -Her activity is limited by her weight, shortness of breath and coughing.  -Pt plans to work on lifestyle modifications. I will see her back in 6 months and plan for possible nuclear stress test if her symptoms persist. She wants to get over her respiratory issues first.   3. Morbid Obesity -Body mass index is 42.4 kg/m.  -We discussed an over all goal of 75 lb wt loss but for now will target 20 lb wt loss over the next 6 months to a year.  -Discussed healthy food options, limiting carbs and portion control.   4. Hyperlipidemia -LDL 168. Noted some scattered plaque in coronary arteries on last chest CT. Would like to  target an LDL of <100. She is reluctant to try cholesterol medication as she says she reacts badly to most medications. She agrees to a trial of atorvastatin.   -Plan to check Lipid panel at her PCP in 3 months.     Lifestyle Modifications to Prevent and Treat Heart Disease -Recommend heart healthy/Mediterranean diet, with whole grains, fruits, vegetable, fish, lean meats, nuts, and olive oil. Limit salt. -Recommend moderate walking, 3-5 times/week for 30-50 minutes each session. Aim for at least 150 minutes.week. Goal should be pace of 3 miles/hours, or walking 1.5 miles in 30 minutes -Recommend avoidance of tobacco products. Avoid excess alcohol.   COVID-19 Education: The signs and symptoms of COVID-19 were discussed with the patient and how to seek care for testing (follow up with PCP or arrange E-visit).  The importance of social distancing was discussed today.  Time:   Today, I have spent 32 minutes with the patient with telehealth technology discussing the above problems.     Medication Adjustments/Labs and Tests Ordered: Current medicines are reviewed at length with the patient today.  Concerns regarding medicines are outlined above.   Tests Ordered: No orders of the defined types were placed in this encounter.   Medication Changes: Meds ordered this encounter  Medications   atorvastatin (LIPITOR) 20 MG tablet    Sig: Take 1 tablet (20 mg total) by mouth daily.    Dispense:  90 tablet    Refill:  3    Disposition:  Follow up in 6 month(s)  Signed, Daune Perch, NP  09/26/2018 11:11 AM    [place]

## 2018-09-26 NOTE — Telephone Encounter (Signed)
Spoke with patient and she stated that she would try the Advair. Patient wanted to let pcp know that she had a virtual visit with NP at cardiologist office and that they wanted her to work on getting cholesterol down with medication. Patient also stated that she would be returning back to work on Saturday.

## 2018-09-26 NOTE — Progress Notes (Signed)
I will see her 

## 2018-10-01 ENCOUNTER — Other Ambulatory Visit: Payer: Self-pay | Admitting: Family Medicine

## 2018-10-01 MED ORDER — FLUTICASONE-SALMETEROL 250-50 MCG/DOSE IN AEPB: 1.0000 | INHALATION_SPRAY | Freq: Two times a day (BID) | RESPIRATORY_TRACT | 3 refills | Status: DC

## 2018-10-01 NOTE — Telephone Encounter (Signed)
Patient informed and verbalized understanding

## 2018-10-01 NOTE — Telephone Encounter (Signed)
Prescription for other was sent to her pharmacy to take 1 puff every 12 hours. Stop Pulmicort. Remind her to swish after using inhaler.  Thanks, BJ

## 2018-10-29 ENCOUNTER — Encounter: Payer: Self-pay | Admitting: Pulmonary Disease

## 2018-10-29 ENCOUNTER — Ambulatory Visit (INDEPENDENT_AMBULATORY_CARE_PROVIDER_SITE_OTHER): Payer: 59 | Admitting: Pulmonary Disease

## 2018-10-29 ENCOUNTER — Other Ambulatory Visit: Payer: Self-pay

## 2018-10-29 DIAGNOSIS — J4521 Mild intermittent asthma with (acute) exacerbation: Secondary | ICD-10-CM

## 2018-10-29 DIAGNOSIS — IMO0001 Reserved for inherently not codable concepts without codable children: Secondary | ICD-10-CM

## 2018-10-29 MED ORDER — BUDESONIDE-FORMOTEROL FUMARATE 160-4.5 MCG/ACT IN AERO: 2.0000 | INHALATION_SPRAY | Freq: Two times a day (BID) | RESPIRATORY_TRACT | 0 refills | Status: DC

## 2018-10-29 MED ORDER — PREDNISONE 10 MG PO TABS: ORAL_TABLET | ORAL | 0 refills | Status: DC

## 2018-10-29 NOTE — Progress Notes (Signed)
Subjective:    Patient ID: Alejandra Vega, female    DOB: 1956-02-26, 63 y.o.   MRN: [id number]  HPI  Chief Complaint  Patient presents with  . Consult    referred by Dr Martinique for cough, occ productive clear mucus, SHOB    63 year old never smoker presents for evaluation of wheezing, presumptively diagnosed as asthma. She reports asthma as a child that she grew out of as an adult.  She reports intermittent wheezing and cough for the past 3 years worse since January 2020 when she was given a course of prednisone that gave her remarkable improvement.  Since then she has had intermittent wheezing, albuterol MDI did not provide much relief and she was prescribed nebs.  She was given Advair but this caused side effects such as loss of taste and worsening of dry cough and she abandoned this.  She had to stay off work in April and states that she felt mildly better.  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  She has been back to work since 09/28/2018 and a part-time basis and complains about the chemicals that they have to use to sanitize surfaces.  She has been evaluated by cardiology for LVH and T wave changes and echo 02/2017 showed normal LV systolic function.  She denies orthopnea, paroxysmal nocturnal dyspnea She reports seasonal allergies at the start of spring and fall for many years for which she takes over-the-counter medications.  She denies significant GERD or postnasal drip. Environment-she lives at home by herself, no pets, she has a new mattress and has had a air purifier for years  Chest x-ray 07/17/2018 was personally reviewed which shows right lower lobe infiltrate. CT chest 08/16/2018 did not show any evidence of ILD, showed cardiomegaly and narrowing of right brachiocephalic vein with some collaterals developing along the anterior and right chest wall  PFTs 08/15/2018 showed moderate restriction with ratio of 76, FEV1 of 54% and FVC of 56% with DLCO of 83% suggesting effect  of obesity.  But there was significant bronchodilator response with improvement in FEV1 by 20% to 65%   Past Medical History:  Diagnosis Date  . Allergy   . Anemia    with pregnancy   . Anxiety   . Asthma    as child/ severe  . Gastric ulcer 2013  . Heart murmur   . Hypertension    no meds/ due to stress   Past Surgical History:  Procedure Laterality Date  . DILATION AND CURETTAGE OF UTERUS     2 times  . miscarriages     had 2  . PILONIDAL CYST EXCISION  1988   2 times  . WISDOM TOOTH EXTRACTION     age 63    Allergies  Allergen Reactions  . Bee Venom     Anaphylaxis  . Glutethimides Shortness Of Breath  . Penicillins Hives, Shortness Of Breath, Itching and Rash  . Shrimp [Shellfish Allergy] Anaphylaxis  . Latex     Itching on hands  . Soy Allergy     Causes diarrhea  . Pork-Derived Products Other (See Comments)    Head ache     Social History   Socioeconomic History  . Marital status: Legally Separated    Spouse name: Not on file  . Number of children: Not on file  . Years of education: Not on file  . Highest education level: Not on file  Occupational History  . Not on file  Social Needs  .  Financial resource strain: Not on file  . Food insecurity:    Worry: Not on file    Inability: Not on file  . Transportation needs:    Medical: Not on file    Non-medical: Not on file  Tobacco Use  . Smoking status: Never Smoker  . Smokeless tobacco: Never Used  Substance and Sexual Activity  . Alcohol use: No  . Drug use: No  . Sexual activity: Not Currently  Lifestyle  . Physical activity:    Days per week: Not on file    Minutes per session: Not on file  . Stress: Not on file  Relationships  . Social connections:    Talks on phone: Not on file    Gets together: Not on file    Attends religious service: Not on file    Active member of club or organization: Not on file    Attends meetings of clubs or organizations: Not on file    Relationship  status: Not on file  . Intimate partner violence:    Fear of current or ex partner: Not on file    Emotionally abused: Not on file    Physically abused: Not on file    Forced sexual activity: Not on file  Other Topics Concern  . Not on file  Social History Narrative  . Not on file     Family History  Problem Relation Age of Onset  . Hypertension Mother   . Heart disease Father   . Alcohol abuse Father   . Breast cancer Maternal Aunt   . Colon cancer Neg Hx   . Colon polyps Neg Hx   . Esophageal cancer Neg Hx   . Rectal cancer Neg Hx   . Stomach cancer Neg Hx      Review of Systems  Constitutional: Positive for fatigue.  Respiratory: Positive for cough and shortness of breath.   Cardiovascular: Positive for chest pain.  Allergic/Immunologic: Positive for food allergies.  Neurological: Positive for weakness.   Constitutional: negative for anorexia, fevers and sweats  Eyes: negative for irritation, redness and visual disturbance  Ears, nose, mouth, throat, and face: negative for earaches, epistaxis, nasal congestion and sore throat  Cardiovascular: negative for lower extremity edema, orthopnea, palpitations and syncope  Gastrointestinal: negative for abdominal pain, constipation, diarrhea, melena, nausea and vomiting  Genitourinary:negative for dysuria, frequency and hematuria  Hematologic/lymphatic: negative for bleeding, easy bruising and lymphadenopathy  Musculoskeletal:negative for arthralgias, muscle weakness Neurological: negative for coordination problems, gait problems, headaches and weakness  Endocrine: negative for diabetic symptoms including polydipsia, polyuria and weight loss     Objective:   Physical Exam  Gen. Pleasant, obese, in no distress, normal affect ENT - no pallor,icterus, no post nasal drip, class 2-3 airway Neck: No JVD, no thyromegaly, no carotid bruits Lungs: no use of accessory muscles, no dullness to percussion, decreased without rales ,  faint rhonchi after coughing Cardiovascular: Rhythm regular, heart sounds  normal, no murmurs or gallops, no peripheral edema Abdomen: soft and non-tender, no hepatosplenomegaly, BS normal. Musculoskeletal: No deformities, no cyanosis or clubbing Neuro:  alert, non focal, no tremors       Assessment & Plan:

## 2018-10-29 NOTE — Patient Instructions (Addendum)
Prednisone 10 mg tabs  Take 2 tabs daily with food x 5ds, then 1 tab daily with food x 5ds then STOP  Sample of Symbicort 160-take 2 puffs twice daily, rinse mouth after use rePort back in 2 weeks and we can send in prescription if this works  Use duo nebs every 6 hours as needed for increased wheezing or shortness of breath

## 2018-10-29 NOTE — Assessment & Plan Note (Signed)
Trigger for her current flare as an adult is unclear-no obvious trigger such as sinus drip or reflux or her environment She has been evaluated by cardiology I emphasized the need for a steroid maintenance inhaler and assured her that we would minimize side effects  Prednisone 10 mg tabs  Take 2 tabs daily with food x 5ds, then 1 tab daily with food x 5ds then STOP  Sample of Symbicort 160-take 2 puffs twice daily, rinse mouth after use rePort back in 2 weeks and we can send in prescription if this works  Use duo nebs every 6 hours as needed for increased wheezing or shortness of breath  Consider allergy testing and aggressive treatment of reflux if no relief with above

## 2018-11-25 ENCOUNTER — Telehealth: Payer: Self-pay | Admitting: *Deleted

## 2018-11-25 NOTE — Telephone Encounter (Signed)
Spoke with patient and she stated that she need intermittent FMLA forms completed. Patient [place]  Copied from [place] [id number]. 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 Time sensitive. Please advise  Best Contact: [phone] VM okay, please advise if appt is necessary.

## 2018-11-25 NOTE — Telephone Encounter (Signed)
Patient scheduled virtual visit with Dr. Martinique to discuss need for FMLA on 11/26/2018.

## 2018-11-26 ENCOUNTER — Encounter: Payer: Self-pay | Admitting: Family Medicine

## 2018-11-26 ENCOUNTER — Other Ambulatory Visit: Payer: Self-pay

## 2018-11-26 ENCOUNTER — Ambulatory Visit (INDEPENDENT_AMBULATORY_CARE_PROVIDER_SITE_OTHER): Payer: 59 | Admitting: Family Medicine

## 2018-11-26 DIAGNOSIS — J309 Allergic rhinitis, unspecified: Secondary | ICD-10-CM | POA: Diagnosis not present

## 2018-11-26 DIAGNOSIS — J4521 Mild intermittent asthma with (acute) exacerbation: Secondary | ICD-10-CM

## 2018-11-26 DIAGNOSIS — IMO0001 Reserved for inherently not codable concepts without codable children: Secondary | ICD-10-CM

## 2018-11-26 NOTE — Assessment & Plan Note (Signed)
This could also be contributing to difficulty breathing. Continue nasal irrigations with saline as needed. She has been referred to immunologist,she may benefit from immunotherapy but she could not afford copay. Continue Cetirizine 10 mg daily and Singulair 10 mg daily.

## 2018-11-26 NOTE — Assessment & Plan Note (Signed)
Some improvement with Symbicort. Ideally she needs to avoid trigger factors. No changes in current management. She has f/u appt with Dr Elsworth Soho tomorrow.

## 2018-11-26 NOTE — Progress Notes (Signed)
Virtual Visit via Video Note   I connected with Alejandra Vega on 11/26/18 at  9:30 AM EDT by a video enabled telemedicine application and verified that I am speaking with the correct person using two identifiers.  Location patient: home Location provider:home office Persons participating in the virtual visit: patient, provider  I discussed the limitations of evaluation and management by telemedicine and the availability of in person appointments. The patient expressed understanding and agreed to proceed.   HPI: Alejandra Vega is a 63 yo female with Hx of allergies,HTN,and HLD requesting FMLA for intermittent leaves. Hx of intermittent episodes of wheezing,dyspnea,and cough. Because persistent symptoms she was to pulmonologist. She has missed many days of work and was told she needed an FMLA completed otherwise she "may get fired."  She already established with pulmonologist, Dr Elsworth Soho. Started on Symbicort 160-4.5 mcg bid,which has helped with wheezing but still feeling like she "cannot get enough air." Fumes at work,cleaning products,and perfumes exacerbate symptoms. It seems to be worse in the morning.  Albuterol neb or inh do not seem to help. She denies fever,chills,sore throat,chest pain,palpitations,or diaphoresis.  COVID 19 negative 3 weeks ago.  + Fatigue.  ROS: See pertinent positives and negatives per HPI.  Past Medical History:  Diagnosis Date  . Allergy   . Anemia    with pregnancy   . Anxiety   . Asthma    as child/ severe  . Gastric ulcer 2013  . Heart murmur   . Hypertension    no meds/ due to stress    Past Surgical History:  Procedure Laterality Date  . DILATION AND CURETTAGE OF UTERUS     2 times  . miscarriages     had 2  . PILONIDAL CYST EXCISION  1988   2 times  . WISDOM TOOTH EXTRACTION     age 42    Family History  Problem Relation Age of Onset  . Hypertension Mother   . Heart disease Father   . Alcohol abuse Father   . Breast cancer  Maternal Aunt   . Colon cancer Neg Hx   . Colon polyps Neg Hx   . Esophageal cancer Neg Hx   . Rectal cancer Neg Hx   . Stomach cancer Neg Hx     Social History   Socioeconomic History  . Marital status: Legally Separated    Spouse name: Not on file  . Number of children: Not on file  . Years of education: Not on file  . Highest education level: Not on file  Occupational History  . Not on file  Social Needs  . Financial resource strain: Not on file  . Food insecurity    Worry: Not on file    Inability: Not on file  . Transportation needs    Medical: Not on file    Non-medical: Not on file  Tobacco Use  . Smoking status: Never Smoker  . Smokeless tobacco: Never Used  Substance and Sexual Activity  . Alcohol use: No  . Drug use: No  . Sexual activity: Not Currently  Lifestyle  . Physical activity    Days per week: Not on file    Minutes per session: Not on file  . Stress: Not on file  Relationships  . Social Herbalist on phone: Not on file    Gets together: Not on file    Attends religious service: Not on file    Active member of club or organization: Not on  file    Attends meetings of clubs or organizations: Not on file    Relationship status: Not on file  . Intimate partner violence    Fear of current or ex partner: Not on file    Emotionally abused: Not on file    Physically abused: Not on file    Forced sexual activity: Not on file  Other Topics Concern  . Not on file  Social History Narrative  . Not on file    Current Outpatient Medications:  .  acetaminophen (TYLENOL) 500 MG tablet, Take 500 mg by mouth as needed., Disp: , Rfl:  .  atorvastatin (LIPITOR) 20 MG tablet, Take 1 tablet (20 mg total) by mouth daily., Disp: 90 tablet, Rfl: 3 .  budesonide-formoterol (SYMBICORT) 160-4.5 MCG/ACT inhaler, Inhale 2 puffs into the lungs 2 (two) times daily., Disp: 1 Inhaler, Rfl: 0 .  cetirizine (ZYRTEC) 10 MG tablet, Take 10 mg by mouth daily., Disp:  , Rfl:  .  Cyanocobalamin (VITAMIN B 12 PO), Take by mouth daily., Disp: , Rfl:  .  diphenhydrAMINE (BENADRYL) 25 MG tablet, Take 25 mg by mouth as needed., Disp: , Rfl:  .  ipratropium-albuterol (DUONEB) 0.5-2.5 (3) MG/3ML SOLN, Take 3 mLs by nebulization every 8 (eight) hours as needed., Disp: 360 mL, Rfl: 1 .  predniSONE (DELTASONE) 10 MG tablet, Take 2, 10mg  tabs x's 5 days with food, take 1, 10mg  tab x's 5 days with food, and stop, Disp: 15 tablet, Rfl: 0 .  Spacer/Aero-Holding Chambers (AEROCHAMBER PLUS) inhaler, Use as instructed, Disp: 1 each, Rfl: 2  EXAM:  VITALS per patient if applicable:N/A  GENERAL: alert, oriented, appears well and in no acute distress  HEENT: atraumatic, conjunctiva clear, no obvious abnormalities on inspection of external nose and ears  NECK: normal movements of the head and neck  LUNGS: on inspection no signs of respiratory distress, breathing rate appears normal, no obvious gross SOB, gasping or wheezing  CV: no obvious cyanosis  PSYCH/NEURO: pleasant and cooperative, no obvious depression or anxiety, speech and thought processing grossly intact  ASSESSMENT AND PLAN:  Discussed the following assessment and plan:   Moderate intermittent asthma Some improvement with Symbicort. Ideally she needs to avoid trigger factors. No changes in current management. She has f/u appt with Dr Elsworth Soho tomorrow.  Allergic rhinitis This could also be contributing to difficulty breathing. Continue nasal irrigations with saline as needed. She has been referred to immunologist,she may benefit from immunotherapy but she could not afford copay. Continue Cetirizine 10 mg daily and Singulair 10 mg daily.   FMLA for intermittent leave x 3 months. Instructed to discuss future FMLA,if needed , with Dr Alva/pulmonologist.    25 min face to face OV. > 50% was dedicated to discussion of Dx, prognosis, and coordination of care.  I discussed the assessment and treatment  plan with the patient. She was provided an opportunity to ask questions and all were answered. The patient agreed with the plan and demonstrated an understanding of the instructions.     Return in about 4 months (around 03/28/2019) for cpe.    Betty Martinique, MD

## 2018-11-27 ENCOUNTER — Ambulatory Visit (INDEPENDENT_AMBULATORY_CARE_PROVIDER_SITE_OTHER): Payer: 59 | Admitting: Pulmonary Disease

## 2018-11-27 ENCOUNTER — Ambulatory Visit: Payer: 59 | Admitting: Pulmonary Disease

## 2018-11-27 DIAGNOSIS — R0602 Shortness of breath: Secondary | ICD-10-CM | POA: Diagnosis not present

## 2018-11-27 DIAGNOSIS — J4520 Mild intermittent asthma, uncomplicated: Secondary | ICD-10-CM | POA: Diagnosis not present

## 2018-11-27 DIAGNOSIS — IMO0001 Reserved for inherently not codable concepts without codable children: Secondary | ICD-10-CM

## 2018-11-27 MED ORDER — PANTOPRAZOLE SODIUM 40 MG PO TBEC: 40.0000 mg | DELAYED_RELEASE_TABLET | Freq: Every day | ORAL | 0 refills | Status: AC

## 2018-11-27 MED ORDER — BUDESONIDE-FORMOTEROL FUMARATE 160-4.5 MCG/ACT IN AERO: 2.0000 | INHALATION_SPRAY | Freq: Two times a day (BID) | RESPIRATORY_TRACT | 2 refills | Status: DC

## 2018-11-27 NOTE — Patient Instructions (Signed)
Prescription for Symbicort 160-2 puffs twice daily with 3 refills will be sent to pharmacy Prescription for Protonix 40 mg daily for 4 weeks  Call us in 4 weeks to touch base, if breathing is no better we will recommend a stress test for your  heart

## 2018-11-27 NOTE — Addendum Note (Signed)
Addended by: Collier Salina on: 11/27/2018 09:56 AM   Modules accepted: Orders

## 2018-11-27 NOTE — Progress Notes (Signed)
   Subjective:    Patient ID: Alejandra Vega, female    DOB: Apr 01, 1956, 63 y.o.   MRN: [id number]  HPI   I connected with  Alejandra Vega on 11/27/18 by phone and verified that I am speaking with the correct person using two identifiers.   I discussed the limitations of evaluation and management by telemedicine. The patient expressed understanding and agreed to proceed.    64 year old never smoker for FU of wheezing, presumptively diagnosed as asthma. She reports asthma as a child that she grew out of as an adult.  She reported intermittent wheezing and cough for the past 3 years worse since January 2020  She was given Advair but this caused side effects such as loss of taste and worsening of dry cough and she abandoned this.  She has been evaluated by cardiology for LVH and T wave changes and echo 02/2017 showed normal LV systolic function.    On her last visit, she was given Symbicort and a prescription for prednisone which caused improvement initially.  But she still reporting elephant over her chest on occasion and intermittent wheezing.  She feels like shortness of breath is increasing She still associates this with work and having to wear a mask at work.  She was tested negative for COVID few weeks ago She works at [employer] and is negotiating a change in her current position, she may need a letter for work.     Significant tests/ events reviewed   Chest x-ray 07/17/2018 right lower lobe infiltrate. CT chest 08/16/2018 did not show any evidence of ILD, showed cardiomegaly and narrowing of right brachiocephalic vein with some collaterals developing along the anterior and right chest wall  PFTs 08/15/2018 showed moderate restriction with ratio of 76, FEV1 of 54% and FVC of 56% with DLCO of 83% suggesting effect of obesity.  But there was significant bronchodilator response with improvement in FEV1 by 20% to 65%  Review of Systems    She denies orthopnea, paroxysmal  nocturnal dyspnea or pedal edema.  There is no exertional chest pain Cough is dry and occasional Objective:   Physical Exam  Not Performed a tele-visit      Assessment & Plan:

## 2018-11-27 NOTE — Assessment & Plan Note (Addendum)
Not convinced that this is true asthma but does not seem to be any other reason for dyspnea.  She has tested negative for COVID, no evidence of fibrosis on CT. no evidence of pulmonary hypertension on prior echo  No obvious angina but cardiac etiology for dyspnea may also have to be considered She seems to be marginally improved with Symbicort  Prescription for Symbicort 160-2 puffs twice daily with 3 refills will be sent to pharmacy Prescription for Protonix 40 mg daily for 4 weeks  Call us in 4 weeks to touch base, if breathing is no better we will recommend a stress test for your  heart

## 2018-11-28 ENCOUNTER — Ambulatory Visit: Payer: 59 | Admitting: Pulmonary Disease

## 2018-12-02 ENCOUNTER — Ambulatory Visit: Payer: 59 | Admitting: Pulmonary Disease

## 2018-12-02 ENCOUNTER — Other Ambulatory Visit: Payer: 59

## 2018-12-03 ENCOUNTER — Other Ambulatory Visit: Payer: Self-pay

## 2018-12-03 ENCOUNTER — Other Ambulatory Visit: Payer: 59

## 2018-12-04 ENCOUNTER — Telehealth: Payer: Self-pay | Admitting: Pulmonary Disease

## 2018-12-04 MED ORDER — BECLOMETHASONE DIPROPIONATE 80 MCG/ACT IN AERS: 2.0000 | INHALATION_SPRAY | Freq: Two times a day (BID) | RESPIRATORY_TRACT | 6 refills | Status: AC

## 2018-12-04 NOTE — Telephone Encounter (Signed)
Rut[facility] Mannan would require a prior authorization and with this, it says that the preferred alternatives would be qvar, asmanex, or alvesco which would need to have been tried/failed first. The same goes for [facility].  Sending Rx to pharmacy for Qvar 80.  I have discontinued Symbicort 160 off of pt's med list.  Attempted to call pt to let her know we were sending a different inhaler to pharmacy for her that is preferred by insurance due to Symb being $300 but unable to reach her. Left message for pt to return call.

## 2018-12-04 NOTE — Telephone Encounter (Signed)
Called and spoke with pt who stated the Symbicort was going to cost her $300. Due to this, pt wants to know if there are any alternatives that could be prescribed instead.  Called pt's pharmacy to see if they could tell me preferred inhalers for pt's insurance and was told that the inhalers that are preferred are Qvar, Asmanex, and Alvesco.  Dr. Elsworth Soho, please advise if it is okay to switch pt to a different inhaler due to the cost of the Symbicort. Thanks!

## 2018-12-04 NOTE — Telephone Encounter (Signed)
Please find out if [facility] or [facility] is covered If not then okay to switch to Qvar -80 twice daily and use albuterol as needed for shortness of breath and wheezing

## 2018-12-05 NOTE — Telephone Encounter (Signed)
Attempted to call pt but unable to reach. Left message for pt to return call. 

## 2018-12-05 NOTE — Telephone Encounter (Signed)
PT RETURNING CALL AND CAN BE REACHED @ [phone].Alejandra Vega

## 2018-12-05 NOTE — Telephone Encounter (Signed)
Spoke with pt. She is aware of this medication change and that the prescription has been sent in. Nothing further was needed.

## 2018-12-09 ENCOUNTER — Telehealth: Payer: Self-pay | Admitting: Family Medicine

## 2018-12-09 NOTE — Telephone Encounter (Signed)
Pt called and stated that she needs a note for work. She states that she has been having to leave work often because of breathing issues. Pt would like a call back from the nurse. Please advise

## 2018-12-10 NOTE — Telephone Encounter (Signed)
Left message to return call 

## 2018-12-11 NOTE — Telephone Encounter (Signed)
LM for patient to return call to clinic concerning note.

## 2018-12-12 NOTE — Telephone Encounter (Signed)
Left several messages for patient to return call to office concerning letter for work.

## 2018-12-13 NOTE — Telephone Encounter (Signed)
Left message to return call to office concerning work note. Per Dr. Martinique, note cannot be provided at this time, patient need to contact pulmonology office.

## 2018-12-16 NOTE — Telephone Encounter (Signed)
Spoke with patient and gave recommendations per Dr. Martinique. Patient verbalized understanding. Nothing further needed at this time.

## 2018-12-25 ENCOUNTER — Telehealth: Payer: Self-pay | Admitting: Pulmonary Disease

## 2018-12-25 NOTE — Telephone Encounter (Signed)
I could not find documentation for when or why this patient was called. There are no lab or procedural results pending. Patient does not have an upcoming appointment.   I attempted to call patient, line went to voicemail. I have left a message to patient letting her know I am unsure of why she was called, however, to give our office a call back if she needs anything or would like an appointment.

## 2018-12-26 NOTE — Telephone Encounter (Signed)
Left detailed message for patient explaining I did not see where anyone from our office had called and if she still had questions, she was more than welcome to call us back.   Will close this message.

## 2018-12-31 ENCOUNTER — Inpatient Hospital Stay (HOSPITAL_COMMUNITY): Payer: Medicaid Other | Admitting: Anesthesiology

## 2018-12-31 ENCOUNTER — Inpatient Hospital Stay (HOSPITAL_COMMUNITY): Payer: Medicaid Other

## 2018-12-31 ENCOUNTER — Emergency Department (HOSPITAL_COMMUNITY): Payer: Medicaid Other

## 2018-12-31 ENCOUNTER — Other Ambulatory Visit (HOSPITAL_COMMUNITY): Payer: Self-pay

## 2018-12-31 ENCOUNTER — Encounter (HOSPITAL_COMMUNITY): Payer: Self-pay | Admitting: Emergency Medicine

## 2018-12-31 ENCOUNTER — Encounter (HOSPITAL_COMMUNITY): Admission: EM | Disposition: E | Payer: Self-pay | Source: Home / Self Care | Attending: Neurology

## 2018-12-31 ENCOUNTER — Inpatient Hospital Stay (HOSPITAL_COMMUNITY)
Admission: EM | Admit: 2018-12-31 | Discharge: 2019-01-28 | DRG: 023 | Disposition: E | Payer: Medicaid Other | Attending: Neurology | Admitting: Neurology

## 2018-12-31 DIAGNOSIS — E870 Hyperosmolality and hypernatremia: Secondary | ICD-10-CM | POA: Diagnosis present

## 2018-12-31 DIAGNOSIS — R1310 Dysphagia, unspecified: Secondary | ICD-10-CM | POA: Diagnosis present

## 2018-12-31 DIAGNOSIS — I959 Hypotension, unspecified: Secondary | ICD-10-CM | POA: Diagnosis not present

## 2018-12-31 DIAGNOSIS — D6489 Other specified anemias: Secondary | ICD-10-CM | POA: Diagnosis present

## 2018-12-31 DIAGNOSIS — Z811 Family history of alcohol abuse and dependence: Secondary | ICD-10-CM

## 2018-12-31 DIAGNOSIS — I7389 Other specified peripheral vascular diseases: Secondary | ICD-10-CM | POA: Diagnosis present

## 2018-12-31 DIAGNOSIS — J9811 Atelectasis: Secondary | ICD-10-CM | POA: Diagnosis present

## 2018-12-31 DIAGNOSIS — I160 Hypertensive urgency: Secondary | ICD-10-CM | POA: Diagnosis present

## 2018-12-31 DIAGNOSIS — Z20828 Contact with and (suspected) exposure to other viral communicable diseases: Secondary | ICD-10-CM | POA: Diagnosis present

## 2018-12-31 DIAGNOSIS — J9600 Acute respiratory failure, unspecified whether with hypoxia or hypercapnia: Secondary | ICD-10-CM

## 2018-12-31 DIAGNOSIS — Z66 Do not resuscitate: Secondary | ICD-10-CM | POA: Diagnosis not present

## 2018-12-31 DIAGNOSIS — Z79899 Other long term (current) drug therapy: Secondary | ICD-10-CM

## 2018-12-31 DIAGNOSIS — R4020 Unspecified coma: Secondary | ICD-10-CM | POA: Diagnosis not present

## 2018-12-31 DIAGNOSIS — G9341 Metabolic encephalopathy: Secondary | ICD-10-CM | POA: Diagnosis present

## 2018-12-31 DIAGNOSIS — J9690 Respiratory failure, unspecified, unspecified whether with hypoxia or hypercapnia: Secondary | ICD-10-CM

## 2018-12-31 DIAGNOSIS — N179 Acute kidney failure, unspecified: Secondary | ICD-10-CM

## 2018-12-31 DIAGNOSIS — Z880 Allergy status to penicillin: Secondary | ICD-10-CM

## 2018-12-31 DIAGNOSIS — W19XXXA Unspecified fall, initial encounter: Secondary | ICD-10-CM | POA: Diagnosis present

## 2018-12-31 DIAGNOSIS — J690 Pneumonitis due to inhalation of food and vomit: Secondary | ICD-10-CM | POA: Diagnosis present

## 2018-12-31 DIAGNOSIS — Z8249 Family history of ischemic heart disease and other diseases of the circulatory system: Secondary | ICD-10-CM

## 2018-12-31 DIAGNOSIS — Z8711 Personal history of peptic ulcer disease: Secondary | ICD-10-CM

## 2018-12-31 DIAGNOSIS — F419 Anxiety disorder, unspecified: Secondary | ICD-10-CM | POA: Diagnosis present

## 2018-12-31 DIAGNOSIS — J9602 Acute respiratory failure with hypercapnia: Secondary | ICD-10-CM | POA: Diagnosis not present

## 2018-12-31 DIAGNOSIS — R9431 Abnormal electrocardiogram [ECG] [EKG]: Secondary | ICD-10-CM | POA: Diagnosis present

## 2018-12-31 DIAGNOSIS — I10 Essential (primary) hypertension: Secondary | ICD-10-CM | POA: Diagnosis present

## 2018-12-31 DIAGNOSIS — Z91030 Bee allergy status: Secondary | ICD-10-CM

## 2018-12-31 DIAGNOSIS — J9601 Acute respiratory failure with hypoxia: Secondary | ICD-10-CM | POA: Diagnosis not present

## 2018-12-31 DIAGNOSIS — E876 Hypokalemia: Secondary | ICD-10-CM | POA: Diagnosis present

## 2018-12-31 DIAGNOSIS — I639 Cerebral infarction, unspecified: Secondary | ICD-10-CM | POA: Diagnosis not present

## 2018-12-31 DIAGNOSIS — I63412 Cerebral infarction due to embolism of left middle cerebral artery: Secondary | ICD-10-CM | POA: Diagnosis present

## 2018-12-31 DIAGNOSIS — D62 Acute posthemorrhagic anemia: Secondary | ICD-10-CM | POA: Diagnosis not present

## 2018-12-31 DIAGNOSIS — E872 Acidosis: Secondary | ICD-10-CM | POA: Diagnosis not present

## 2018-12-31 DIAGNOSIS — I6602 Occlusion and stenosis of left middle cerebral artery: Secondary | ICD-10-CM | POA: Diagnosis present

## 2018-12-31 DIAGNOSIS — Z91018 Allergy to other foods: Secondary | ICD-10-CM

## 2018-12-31 DIAGNOSIS — Z515 Encounter for palliative care: Secondary | ICD-10-CM | POA: Diagnosis not present

## 2018-12-31 DIAGNOSIS — Z6841 Body Mass Index (BMI) 40.0 and over, adult: Secondary | ICD-10-CM

## 2018-12-31 DIAGNOSIS — J4520 Mild intermittent asthma, uncomplicated: Secondary | ICD-10-CM | POA: Diagnosis present

## 2018-12-31 DIAGNOSIS — R739 Hyperglycemia, unspecified: Secondary | ICD-10-CM | POA: Diagnosis present

## 2018-12-31 DIAGNOSIS — I672 Cerebral atherosclerosis: Secondary | ICD-10-CM | POA: Diagnosis present

## 2018-12-31 DIAGNOSIS — Y92009 Unspecified place in unspecified non-institutional (private) residence as the place of occurrence of the external cause: Secondary | ICD-10-CM | POA: Diagnosis not present

## 2018-12-31 DIAGNOSIS — G8191 Hemiplegia, unspecified affecting right dominant side: Secondary | ICD-10-CM | POA: Diagnosis present

## 2018-12-31 DIAGNOSIS — I6522 Occlusion and stenosis of left carotid artery: Secondary | ICD-10-CM

## 2018-12-31 DIAGNOSIS — I63422 Cerebral infarction due to embolism of left anterior cerebral artery: Secondary | ICD-10-CM | POA: Diagnosis not present

## 2018-12-31 DIAGNOSIS — R29810 Facial weakness: Secondary | ICD-10-CM | POA: Diagnosis present

## 2018-12-31 DIAGNOSIS — Z8673 Personal history of transient ischemic attack (TIA), and cerebral infarction without residual deficits: Secondary | ICD-10-CM

## 2018-12-31 DIAGNOSIS — Z91013 Allergy to seafood: Secondary | ICD-10-CM

## 2018-12-31 DIAGNOSIS — R531 Weakness: Secondary | ICD-10-CM

## 2018-12-31 DIAGNOSIS — I161 Hypertensive emergency: Secondary | ICD-10-CM | POA: Diagnosis not present

## 2018-12-31 DIAGNOSIS — E785 Hyperlipidemia, unspecified: Secondary | ICD-10-CM | POA: Diagnosis present

## 2018-12-31 DIAGNOSIS — Z7951 Long term (current) use of inhaled steroids: Secondary | ICD-10-CM

## 2018-12-31 DIAGNOSIS — Z91040 Latex allergy status: Secondary | ICD-10-CM

## 2018-12-31 DIAGNOSIS — Z781 Physical restraint status: Secondary | ICD-10-CM

## 2018-12-31 DIAGNOSIS — R0902 Hypoxemia: Secondary | ICD-10-CM

## 2018-12-31 HISTORY — PX: IR ANGIO VERTEBRAL SEL SUBCLAVIAN INNOMINATE UNI R MOD SED: IMG5365

## 2018-12-31 HISTORY — PX: IR ANGIO INTRA EXTRACRAN SEL COM CAROTID INNOMINATE UNI R MOD SED: IMG5359

## 2018-12-31 HISTORY — PX: IR CT HEAD LTD: IMG2386

## 2018-12-31 HISTORY — PX: RADIOLOGY WITH ANESTHESIA: SHX6223

## 2018-12-31 HISTORY — PX: IR PERCUTANEOUS ART THROMBECTOMY/INFUSION INTRACRANIAL INC DIAG ANGIO: IMG6087

## 2018-12-31 LAB — TROPONIN I (HIGH SENSITIVITY)
Troponin I (High Sensitivity): 384 ng/L (ref <18)
Troponin I (High Sensitivity): 323 ng/L (ref <18)

## 2018-12-31 LAB — RAPID URINE DRUG SCREEN, HOSP PERFORMED
Amphetamines: NOT DETECTED
Barbiturates: NOT DETECTED
Benzodiazepines: NOT DETECTED
Cocaine: NOT DETECTED
Opiates: NOT DETECTED
Tetrahydrocannabinol: NOT DETECTED

## 2018-12-31 LAB — CBC
HCT: 30.6 % — ABNORMAL LOW (ref 36.0–46.0)
Hemoglobin: 9.4 g/dL — ABNORMAL LOW (ref 12.0–15.0)
MCH: 24.5 pg — ABNORMAL LOW (ref 26.0–34.0)
MCHC: 30.7 g/dL (ref 30.0–36.0)
MCV: 79.9 fL — ABNORMAL LOW (ref 80.0–100.0)
Platelets: 285 10*3/uL (ref 150–400)
RBC: 3.83 MIL/uL — ABNORMAL LOW (ref 3.87–5.11)
RDW: 16.7 % — ABNORMAL HIGH (ref 11.5–15.5)
WBC: 8.6 10*3/uL (ref 4.0–10.5)
nRBC: 0 % (ref 0.0–0.2)

## 2018-12-31 LAB — POCT I-STAT 7, (LYTES, BLD GAS, ICA,H+H)
Acid-base deficit: 2 mmol/L (ref 0.0–2.0)
Bicarbonate: 29.6 mmol/L — ABNORMAL HIGH (ref 20.0–28.0)
Calcium, Ion: 1.29 mmol/L (ref 1.15–1.40)
HCT: 39 % (ref 36.0–46.0)
Hemoglobin: 13.3 g/dL (ref 12.0–15.0)
O2 Saturation: 100 %
Potassium: 3.9 mmol/L (ref 3.5–5.1)
Sodium: 139 mmol/L (ref 135–145)
TCO2: 32 mmol/L (ref 22–32)
pCO2 arterial: 86.1 mmHg (ref 32.0–48.0)
pH, Arterial: 7.145 — CL (ref 7.350–7.450)
pO2, Arterial: 255 mmHg — ABNORMAL HIGH (ref 83.0–108.0)

## 2018-12-31 LAB — COMPREHENSIVE METABOLIC PANEL
ALT: 53 U/L — ABNORMAL HIGH (ref 0–44)
AST: 35 U/L (ref 15–41)
Albumin: 4.1 g/dL (ref 3.5–5.0)
Alkaline Phosphatase: 99 U/L (ref 38–126)
Anion gap: 12 (ref 5–15)
BUN: 19 mg/dL (ref 8–23)
CO2: 25 mmol/L (ref 22–32)
Calcium: 9.6 mg/dL (ref 8.9–10.3)
Chloride: 103 mmol/L (ref 98–111)
Creatinine, Ser: 1.29 mg/dL — ABNORMAL HIGH (ref 0.44–1.00)
GFR calc Af Amer: 51 mL/min — ABNORMAL LOW (ref >60)
GFR calc non Af Amer: 44 mL/min — ABNORMAL LOW (ref >60)
Glucose, Bld: 209 mg/dL — ABNORMAL HIGH (ref 70–99)
Potassium: 4.2 mmol/L (ref 3.5–5.1)
Sodium: 140 mmol/L (ref 135–145)
Total Bilirubin: 1.1 mg/dL (ref 0.3–1.2)
Total Protein: 7.9 g/dL (ref 6.5–8.1)

## 2018-12-31 LAB — CBC WITH DIFFERENTIAL/PLATELET
Abs Immature Granulocytes: 0.05 10*3/uL (ref 0.00–0.07)
Basophils Absolute: 0.1 10*3/uL (ref 0.0–0.1)
Basophils Relative: 1 %
Eosinophils Absolute: 0.2 10*3/uL (ref 0.0–0.5)
Eosinophils Relative: 2 %
HCT: 43.9 % (ref 36.0–46.0)
Hemoglobin: 13.1 g/dL (ref 12.0–15.0)
Immature Granulocytes: 1 %
Lymphocytes Relative: 28 %
Lymphs Abs: 2.4 10*3/uL (ref 0.7–4.0)
MCH: 24.2 pg — ABNORMAL LOW (ref 26.0–34.0)
MCHC: 29.8 g/dL — ABNORMAL LOW (ref 30.0–36.0)
MCV: 81 fL (ref 80.0–100.0)
Monocytes Absolute: 0.7 10*3/uL (ref 0.1–1.0)
Monocytes Relative: 9 %
Neutro Abs: 5 10*3/uL (ref 1.7–7.7)
Neutrophils Relative %: 59 %
Platelets: 395 10*3/uL (ref 150–400)
RBC: 5.42 MIL/uL — ABNORMAL HIGH (ref 3.87–5.11)
RDW: 17 % — ABNORMAL HIGH (ref 11.5–15.5)
WBC: 8.5 10*3/uL (ref 4.0–10.5)
nRBC: 0 % (ref 0.0–0.2)

## 2018-12-31 LAB — PHOSPHORUS: Phosphorus: 4.3 mg/dL (ref 2.5–4.6)

## 2018-12-31 LAB — LACTIC ACID, PLASMA: Lactic Acid, Venous: 0.9 mmol/L (ref 0.5–1.9)

## 2018-12-31 LAB — GLUCOSE, CAPILLARY
Glucose-Capillary: 140 mg/dL — ABNORMAL HIGH (ref 70–99)
Glucose-Capillary: 123 mg/dL — ABNORMAL HIGH (ref 70–99)

## 2018-12-31 LAB — HEMOGLOBIN A1C
Hgb A1c MFr Bld: 5.7 % — ABNORMAL HIGH (ref 4.8–5.6)
Mean Plasma Glucose: 116.89 mg/dL

## 2018-12-31 LAB — SARS CORONAVIRUS 2 BY RT PCR (HOSPITAL ORDER, PERFORMED IN ~~LOC~~ HOSPITAL LAB): SARS Coronavirus 2: NEGATIVE

## 2018-12-31 LAB — D-DIMER, QUANTITATIVE: D-Dimer, Quant: 7.71 ug/mL-FEU — ABNORMAL HIGH (ref 0.00–0.50)

## 2018-12-31 LAB — TRIGLYCERIDES: Triglycerides: 278 mg/dL — ABNORMAL HIGH (ref <150)

## 2018-12-31 LAB — BRAIN NATRIURETIC PEPTIDE: B Natriuretic Peptide: 2574.5 pg/mL — ABNORMAL HIGH (ref 0.0–100.0)

## 2018-12-31 LAB — MAGNESIUM: Magnesium: 1.9 mg/dL (ref 1.7–2.4)

## 2018-12-31 SURGERY — IR WITH ANESTHESIA
Anesthesia: General

## 2018-12-31 MED ORDER — PROPOFOL 1000 MG/100ML IV EMUL
INTRAVENOUS | Status: AC | PRN
  Administered 2018-12-31: 11:00:00 via INTRAVENOUS
  Administered 2018-12-31: 5 ug/kg/min via INTRAVENOUS

## 2018-12-31 MED ORDER — INSULIN ASPART 100 UNIT/ML ~~LOC~~ SOLN
0.0000 [IU] | SUBCUTANEOUS | Status: DC
  Administered 2019-01-01: 2 [IU] via SUBCUTANEOUS
  Administered 2019-01-01: 08:00:00 3 [IU] via SUBCUTANEOUS
  Administered 2019-01-03 – 2019-01-07 (×4): 2 [IU] via SUBCUTANEOUS

## 2018-12-31 MED ORDER — CLEVIDIPINE BUTYRATE 0.5 MG/ML IV EMUL: 0.0000 mg/h | INTRAVENOUS | Status: DC

## 2018-12-31 MED ORDER — ROCURONIUM BROMIDE 100 MG/10ML IV SOLN
INTRAVENOUS | Status: DC | PRN
  Administered 2018-12-31: 30 mg via INTRAVENOUS
  Administered 2018-12-31: 70 mg via INTRAVENOUS

## 2018-12-31 MED ORDER — PROPOFOL 10 MG/ML IV BOLUS
INTRAVENOUS | Status: DC | PRN
  Administered 2018-12-31 (×3): 30 mg via INTRAVENOUS
  Administered 2018-12-31: 40 mg via INTRAVENOUS

## 2018-12-31 MED ORDER — LACTATED RINGERS IV SOLN
INTRAVENOUS | Status: DC | PRN
  Administered 2018-12-31 (×2): via INTRAVENOUS

## 2018-12-31 MED ORDER — PROPOFOL 1000 MG/100ML IV EMUL
INTRAVENOUS | Status: AC
  Filled 2018-12-31: qty 100

## 2018-12-31 MED ORDER — SODIUM CHLORIDE 0.9 % IV SOLN
INTRAVENOUS | Status: DC | PRN
  Administered 2018-12-31: 15 ug/min via INTRAVENOUS

## 2018-12-31 MED ORDER — IPRATROPIUM-ALBUTEROL 0.5-2.5 (3) MG/3ML IN SOLN
3.0000 mL | Freq: Four times a day (QID) | RESPIRATORY_TRACT | Status: DC
  Administered 2018-12-31 – 2019-01-04 (×14): 3 mL via RESPIRATORY_TRACT
  Filled 2018-12-31 (×13): qty 3

## 2018-12-31 MED ORDER — ASPIRIN 81 MG PO CHEW
CHEWABLE_TABLET | ORAL | Status: AC | PRN
  Administered 2018-12-31: 81 mg via NASOGASTRIC

## 2018-12-31 MED ORDER — FENTANYL 2500MCG IN NS 250ML (10MCG/ML) PREMIX INFUSION
25.0000 ug/h | INTRAVENOUS | Status: DC
  Administered 2018-12-31 – 2019-01-01 (×2): 50 ug/h via INTRAVENOUS
  Filled 2018-12-31 (×3): qty 250

## 2018-12-31 MED ORDER — TIROFIBAN HCL IN NACL 5-0.9 MG/100ML-% IV SOLN
INTRAVENOUS | Status: AC
  Filled 2018-12-31: qty 100

## 2018-12-31 MED ORDER — ETOMIDATE 2 MG/ML IV SOLN
INTRAVENOUS | Status: AC | PRN
  Administered 2018-12-31: 20 mg via INTRAVENOUS

## 2018-12-31 MED ORDER — VANCOMYCIN HCL IN DEXTROSE 1-5 GM/200ML-% IV SOLN
INTRAVENOUS | Status: AC
  Filled 2018-12-31: qty 200

## 2018-12-31 MED ORDER — IOHEXOL 300 MG/ML  SOLN
150.0000 mL | Freq: Once | INTRAMUSCULAR | Status: AC | PRN
  Administered 2018-12-31: 18:00:00 25 mL via INTRAVENOUS

## 2018-12-31 MED ORDER — TICAGRELOR 90 MG PO TABS
ORAL_TABLET | ORAL | Status: AC
  Filled 2018-12-31: qty 2

## 2018-12-31 MED ORDER — PANTOPRAZOLE SODIUM 40 MG IV SOLR
40.0000 mg | Freq: Every day | INTRAVENOUS | Status: DC
  Administered 2018-12-31: 40 mg via INTRAVENOUS
  Filled 2018-12-31: qty 40

## 2018-12-31 MED ORDER — CLEVIDIPINE BUTYRATE 0.5 MG/ML IV EMUL
0.0000 mg/h | INTRAVENOUS | Status: DC
  Administered 2018-12-31: 4 mg/h via INTRAVENOUS
  Filled 2018-12-31: qty 50

## 2018-12-31 MED ORDER — FENTANYL CITRATE (PF) 100 MCG/2ML IJ SOLN
INTRAMUSCULAR | Status: AC
  Filled 2018-12-31: qty 2

## 2018-12-31 MED ORDER — NITROGLYCERIN 1 MG/10 ML FOR IR/CATH LAB
INTRA_ARTERIAL | Status: AC | PRN
  Administered 2018-12-31 (×3): 25 ug via INTRA_ARTERIAL

## 2018-12-31 MED ORDER — PROPOFOL 1000 MG/100ML IV EMUL
5.0000 ug/kg/min | INTRAVENOUS | Status: DC
  Administered 2018-12-31: 40 ug/kg/min via INTRAVENOUS
  Administered 2018-12-31: 21:00:00 25 ug/kg/min via INTRAVENOUS
  Administered 2019-01-01: 13:00:00 20 ug/kg/min via INTRAVENOUS
  Administered 2019-01-01: 21:00:00 50 ug/kg/min via INTRAVENOUS
  Administered 2019-01-01: 03:00:00 40 ug/kg/min via INTRAVENOUS
  Administered 2019-01-01: 18:00:00 50 ug/kg/min via INTRAVENOUS
  Administered 2019-01-01: 06:00:00 40 ug/kg/min via INTRAVENOUS
  Administered 2019-01-02 (×2): 50 ug/kg/min via INTRAVENOUS
  Filled 2018-12-31 (×2): qty 200
  Filled 2018-12-31 (×5): qty 100

## 2018-12-31 MED ORDER — HYDRALAZINE HCL 20 MG/ML IJ SOLN: 20.0000 mg | INTRAMUSCULAR | Status: DC | PRN

## 2018-12-31 MED ORDER — BUDESONIDE 0.5 MG/2ML IN SUSP
0.5000 mg | Freq: Two times a day (BID) | RESPIRATORY_TRACT | Status: DC
  Administered 2018-12-31 – 2019-01-09 (×18): 0.5 mg via RESPIRATORY_TRACT
  Filled 2018-12-31 (×18): qty 2

## 2018-12-31 MED ORDER — ONDANSETRON HCL 4 MG/2ML IJ SOLN
INTRAMUSCULAR | Status: DC | PRN
  Administered 2018-12-31: 4 mg via INTRAVENOUS

## 2018-12-31 MED ORDER — IOHEXOL 300 MG/ML  SOLN
150.0000 mL | Freq: Once | INTRAMUSCULAR | Status: AC | PRN
  Administered 2018-12-31: 17:00:00 75 mL via INTRAVENOUS

## 2018-12-31 MED ORDER — ALBUTEROL SULFATE HFA 108 (90 BASE) MCG/ACT IN AERS
INHALATION_SPRAY | RESPIRATORY_TRACT | Status: AC
  Filled 2018-12-31: qty 6.7

## 2018-12-31 MED ORDER — ACETAMINOPHEN 650 MG RE SUPP: 650.0000 mg | RECTAL | Status: DC | PRN

## 2018-12-31 MED ORDER — CHLORHEXIDINE GLUCONATE 0.12% ORAL RINSE (MEDLINE KIT)
15.0000 mL | Freq: Two times a day (BID) | OROMUCOSAL | Status: DC
  Administered 2018-12-31 – 2019-01-09 (×18): 15 mL via OROMUCOSAL

## 2018-12-31 MED ORDER — HEPARIN SODIUM (PORCINE) 5000 UNIT/ML IJ SOLN
5000.0000 [IU] | Freq: Three times a day (TID) | INTRAMUSCULAR | Status: DC
  Administered 2018-12-31 – 2019-01-09 (×26): 5000 [IU] via SUBCUTANEOUS
  Filled 2018-12-31 (×24): qty 1

## 2018-12-31 MED ORDER — SODIUM CHLORIDE 0.9 % IV BOLUS
500.0000 mL | Freq: Once | INTRAVENOUS | Status: AC
  Administered 2018-12-31: 500 mL via INTRAVENOUS

## 2018-12-31 MED ORDER — IOHEXOL 350 MG/ML SOLN
75.0000 mL | Freq: Once | INTRAVENOUS | Status: AC | PRN
  Administered 2018-12-31: 14:00:00 75 mL via INTRAVENOUS

## 2018-12-31 MED ORDER — FENTANYL CITRATE (PF) 100 MCG/2ML IJ SOLN
50.0000 ug | Freq: Once | INTRAMUSCULAR | Status: AC
  Administered 2018-12-31: 100 ug via INTRAVENOUS
  Filled 2018-12-31: qty 2

## 2018-12-31 MED ORDER — CLEVIDIPINE BUTYRATE 0.5 MG/ML IV EMUL
0.0000 mg/h | INTRAVENOUS | Status: DC
  Administered 2018-12-31: 23:00:00 19 mg/h via INTRAVENOUS
  Administered 2018-12-31 (×2): 15 mg/h via INTRAVENOUS
  Administered 2019-01-01 (×2): 12 mg/h via INTRAVENOUS
  Administered 2019-01-01: 08:00:00 14 mg/h via INTRAVENOUS
  Administered 2019-01-01: 19:00:00 6 mg/h via INTRAVENOUS
  Administered 2019-01-01: 14 mg/h via INTRAVENOUS
  Administered 2019-01-01: 10:00:00 12 mg/h via INTRAVENOUS
  Administered 2019-01-01: 18:00:00 10 mg/h via INTRAVENOUS
  Administered 2019-01-01: 06:00:00 14 mg/h via INTRAVENOUS
  Administered 2019-01-01: 6 mg/h via INTRAVENOUS
  Administered 2019-01-02: 02:00:00 9 mg/h via INTRAVENOUS
  Filled 2018-12-31: qty 50
  Filled 2018-12-31 (×2): qty 150
  Filled 2018-12-31: qty 50
  Filled 2018-12-31: qty 100
  Filled 2018-12-31 (×2): qty 50

## 2018-12-31 MED ORDER — ACETAMINOPHEN 160 MG/5ML PO SOLN
650.0000 mg | ORAL | Status: DC | PRN
  Administered 2019-01-03 – 2019-01-09 (×5): 650 mg
  Filled 2018-12-31 (×5): qty 20.3

## 2018-12-31 MED ORDER — SUCCINYLCHOLINE CHLORIDE 20 MG/ML IJ SOLN
INTRAMUSCULAR | Status: AC | PRN
  Administered 2018-12-31: 100 mg via INTRAVENOUS

## 2018-12-31 MED ORDER — PROPOFOL 1000 MG/100ML IV EMUL
INTRAVENOUS | Status: AC
  Administered 2018-12-31: 11:00:00 via INTRAVENOUS
  Filled 2018-12-31: qty 100

## 2018-12-31 MED ORDER — ARFORMOTEROL TARTRATE 15 MCG/2ML IN NEBU
15.0000 ug | INHALATION_SOLUTION | Freq: Two times a day (BID) | RESPIRATORY_TRACT | Status: DC
  Administered 2018-12-31 – 2019-01-04 (×8): 15 ug via RESPIRATORY_TRACT
  Filled 2018-12-31 (×9): qty 2

## 2018-12-31 MED ORDER — ACETAMINOPHEN 325 MG PO TABS: 650.0000 mg | ORAL_TABLET | ORAL | Status: DC | PRN

## 2018-12-31 MED ORDER — VANCOMYCIN HCL 1000 MG IV SOLR
INTRAVENOUS | Status: DC | PRN
  Administered 2018-12-31: 1000 mg via INTRAVENOUS

## 2018-12-31 MED ORDER — FENTANYL BOLUS VIA INFUSION
50.0000 ug | INTRAVENOUS | Status: DC | PRN
  Filled 2018-12-31: qty 50

## 2018-12-31 MED ORDER — SODIUM CHLORIDE 0.9 % IV SOLN
INTRAVENOUS | Status: DC
  Administered 2018-12-31 – 2019-01-09 (×8): via INTRAVENOUS

## 2018-12-31 MED ORDER — EPTIFIBATIDE 20 MG/10ML IV SOLN
INTRAVENOUS | Status: AC
  Filled 2018-12-31: qty 10

## 2018-12-31 MED ORDER — BISACODYL 10 MG RE SUPP
10.0000 mg | Freq: Every day | RECTAL | Status: DC | PRN
  Administered 2019-01-04: 16:00:00 10 mg via RECTAL
  Filled 2018-12-31: qty 1

## 2018-12-31 MED ORDER — ASPIRIN 81 MG PO CHEW
CHEWABLE_TABLET | ORAL | Status: AC
  Filled 2018-12-31: qty 1

## 2018-12-31 MED ORDER — ORAL CARE MOUTH RINSE
15.0000 mL | OROMUCOSAL | Status: DC
  Administered 2018-12-31 – 2019-01-09 (×86): 15 mL via OROMUCOSAL

## 2018-12-31 MED ORDER — IOHEXOL 350 MG/ML SOLN
50.0000 mL | Freq: Once | INTRAVENOUS | Status: AC | PRN
  Administered 2018-12-31: 14:00:00 50 mL via INTRAVENOUS

## 2018-12-31 MED ORDER — DEXAMETHASONE SODIUM PHOSPHATE 4 MG/ML IJ SOLN
INTRAMUSCULAR | Status: DC | PRN
  Administered 2018-12-31: 5 mg via INTRAVENOUS

## 2018-12-31 MED ORDER — TICAGRELOR 60 MG PO TABS
ORAL_TABLET | ORAL | Status: AC | PRN
  Administered 2018-12-31: 90 mg via NASOGASTRIC

## 2018-12-31 MED ORDER — NITROGLYCERIN 1 MG/10 ML FOR IR/CATH LAB
INTRA_ARTERIAL | Status: AC
  Filled 2018-12-31: qty 10

## 2018-12-31 MED ORDER — SODIUM CHLORIDE 0.9 % IV SOLN: INTRAVENOUS | Status: DC

## 2018-12-31 MED ORDER — CLOPIDOGREL BISULFATE 300 MG PO TABS
ORAL_TABLET | ORAL | Status: AC
  Filled 2018-12-31: qty 1

## 2018-12-31 MED ORDER — PHENYLEPHRINE HCL (PRESSORS) 10 MG/ML IV SOLN
INTRAVENOUS | Status: DC | PRN
  Administered 2018-12-31: 40 ug via INTRAVENOUS

## 2018-12-31 MED ORDER — CLEVIDIPINE BUTYRATE 0.5 MG/ML IV EMUL
0.0000 mg/h | INTRAVENOUS | Status: DC
  Filled 2018-12-31: qty 50

## 2018-12-31 MED ORDER — ASPIRIN 325 MG PO TABS
ORAL_TABLET | ORAL | Status: AC
  Filled 2018-12-31: qty 1

## 2018-12-31 MED ORDER — CHLORHEXIDINE GLUCONATE CLOTH 2 % EX PADS
6.0000 | MEDICATED_PAD | Freq: Every day | CUTANEOUS | Status: DC
  Administered 2018-12-31 – 2019-01-02 (×3): 6 via TOPICAL

## 2018-12-31 MED ORDER — LIDOCAINE HCL 1 % IJ SOLN
INTRAMUSCULAR | Status: AC
  Filled 2018-12-31: qty 20

## 2018-12-31 NOTE — Progress Notes (Signed)
Pt not available, in IR at this time.  Will check back.

## 2018-12-31 NOTE — Sedation Documentation (Signed)
Patient under the care of anesthesia and intubated.

## 2018-12-31 NOTE — ED Provider Notes (Signed)
[facility] EMERGENCY DEPARTMENT Provider Note   CSN: [id number] Arrival date & time: 01/03/2019  1025    History   Chief Complaint Chief Complaint  Patient presents with   Shortness of Breath   Weakness    HPI Alejandra Vega is a 63 y.o. female with history of obesity and hypertension presenting to the ED via EMS with complaints of right-sided weakness, right-sided facial droop, and shortness of breath.  Per EMS, they were called out for shortness of breath and noted patient to have right-sided facial droop and right sided weakness on their arrival.  They report seeing a spot in the wall where patient states she fell and hit her head.  They report the patient became acutely more short of breath on arrival to the ED requiring a nonrebreather.  Spoke with patient's daughter, who is unsure of patient's last known normal time.  She reports she was called by her mother this morning who told her that she fell and needed help.  She reports her mother was not quite herself on the phone and seemed to be coming in and out of consciousness.  She reports her mother would not answer questions appropriately but just kept saying "help me" on the phone.  On arrival to the ED, patient would not answer questions but would only state "I can't breathe."     The history is provided by the EMS personnel and a relative. The history is limited by the condition of the patient.    Past Medical History:  Diagnosis Date   Allergy    Anemia    with pregnancy    Anxiety    Asthma    as child/ severe   Gastric ulcer 2013   Heart murmur    Hypertension    no meds/ due to stress    Patient Active Problem List   Diagnosis Date Noted   Acute metabolic encephalopathy 23/55/7322   LVH (left ventricular hypertrophy) due to hypertensive disease, without heart failure 09/26/2018   Obesity, Class III, BMI 40-49.9 (morbid obesity) ([place]) 09/26/2018   Educated About Covid-19 Virus  Infection 09/26/2018   Shortness of breath 08/13/2018   GERD (gastroesophageal reflux disease) 07/10/2017   Anxiety disorder, unspecified 07/10/2017   Hyperlipidemia 01/30/2017   Shellfish allergy 09/26/2016   Allergic rhinitis 07/27/2016   Essential hypertension, benign 04/14/2016   Bilateral primary osteoarthritis of knee 03/08/2016   Morbid obesity with BMI of 45.0-49.9, adult ([place]) 12/30/2011   Abdominal pain, acute, epigastric 12/25/2011   Acute pancreatitis 12/25/2011   Elevated BP 12/25/2011   Moderate intermittent asthma     Past Surgical History:  Procedure Laterality Date   DILATION AND CURETTAGE OF UTERUS     2 times   miscarriages     had 2   PILONIDAL CYST EXCISION  1988   2 times   WISDOM TOOTH EXTRACTION     age 71     OB History   No obstetric history on file.      Home Medications    Prior to Admission medications   Medication Sig Start Date End Date Taking? Authorizing Provider  acetaminophen (TYLENOL) 500 MG tablet Take 500 mg by mouth every 8 (eight) hours as needed for mild pain, fever or headache.    Yes [provider]  albuterol (VENTOLIN HFA) 108 (90 Base) MCG/ACT inhaler Inhale 2 puffs into the lungs every 6 (six) hours as needed for wheezing or shortness of breath.  08/15/18  Yes  [provider]  beclomethasone (QVAR) 80 MCG/ACT inhaler Inhale 2 puffs into the lungs 2 (two) times daily. 12/04/18  Yes Rigoberto Noel, MD  cetirizine (ZYRTEC) 10 MG tablet Take 10 mg by mouth daily.   Yes [provider]  Cyanocobalamin (VITAMIN B 12 PO) Take 1 tablet by mouth daily.    Yes [provider]  diphenhydrAMINE (BENADRYL) 25 MG tablet Take 25 mg by mouth as needed for itching or allergies.    Yes [provider]  ipratropium-albuterol (DUONEB) 0.5-2.5 (3) MG/3ML SOLN Take 3 mLs by nebulization every 8 (eight) hours as needed. 07/29/18  Yes Martinique, Betty G, MD  pantoprazole (PROTONIX) 40 MG tablet  Take 1 tablet (40 mg total) by mouth daily. 11/27/18  Yes Rigoberto Noel, MD  Spacer/Aero-Holding Chambers (AEROCHAMBER PLUS) inhaler Use as instructed 04/28/16  Yes Martinique, Betty G, MD  atorvastatin (LIPITOR) 20 MG tablet Take 1 tablet (20 mg total) by mouth daily. Patient not taking: Reported on 12/29/2018 09/26/18 12/25/18  Daune Perch, NP    Family History Family History  Problem Relation Age of Onset   Hypertension Mother    Heart disease Father    Alcohol abuse Father    Breast cancer Maternal Aunt    Colon cancer Neg Hx    Colon polyps Neg Hx    Esophageal cancer Neg Hx    Rectal cancer Neg Hx    Stomach cancer Neg Hx     Social History Social History   Tobacco Use   Smoking status: Never Smoker   Smokeless tobacco: Never Used  Substance Use Topics   Alcohol use: No   Drug use: No     Allergies   Bee venom, Glutethimides, Penicillins, Shrimp [shellfish allergy], Latex, Soy allergy, and Pork-derived products   Review of Systems Review of Systems  Unable to perform ROS: Mental status change  Respiratory: Positive for shortness of breath.   Neurological: Positive for facial asymmetry and weakness.     Physical Exam Updated Vital Signs BP (!) 196/111    Pulse 96    Resp (!) 22    Ht _0  (1.549 m)    SpO2 100%    BMI 45.88 kg/m   Physical Exam Vitals signs and nursing note reviewed.  Constitutional:      General: She is in acute distress.     Appearance: She is ill-appearing and toxic-appearing.  HENT:     Head: Normocephalic and atraumatic.     Mouth/Throat:     Mouth: Mucous membranes are moist.     Pharynx: Oropharynx is clear. No oropharyngeal exudate or posterior oropharyngeal erythema.  Eyes:     Extraocular Movements: Extraocular movements intact.     Conjunctiva/sclera: Conjunctivae normal.     Pupils: Pupils are equal, round, and reactive to light.  Neck:     Musculoskeletal: Normal range of motion and neck supple. No neck  rigidity.  Cardiovascular:     Rate and Rhythm: Regular rhythm. Tachycardia present.     Pulses: Normal pulses.     Heart sounds: Normal heart sounds. No murmur. No friction rub. No gallop.   Pulmonary:     Effort: Respiratory distress present.     Comments: Tachypneic with irregular respirations on nonrebreather with oxygen saturation of 100%.  When nonrebreather was removed, patient quickly desatted to 90.  Breath sounds are diminished bilaterally. Abdominal:     General: Abdomen is flat. There is no distension.     Palpations: Abdomen  is soft.     Tenderness: There is no guarding.  Musculoskeletal: Normal range of motion.        General: No swelling, tenderness, deformity or signs of injury.  Neurological:     Mental Status: She is disoriented.     Motor: Weakness (to right upper extremity) present.     Comments: Right-sided facial droop present.  Patient is disoriented and will only answer questions with "I can't breathe".  There is no dysarthria noted with her speech.  Patient is able to follow commands and squeeze examiner's fingers with her left hand but has markedly decreased grip strength on the right.      ED Treatments / Results  Labs (all labs ordered are listed, but only abnormal results are displayed) Labs Reviewed  COMPREHENSIVE METABOLIC PANEL - Abnormal; Notable for the following components:      Result Value   Glucose, Bld 209 (*)    Creatinine, Ser 1.29 (*)    ALT 53 (*)    GFR calc non Af Amer 44 (*)    GFR calc Af Amer 51 (*)    All other components within normal limits  CBC WITH DIFFERENTIAL/PLATELET - Abnormal; Notable for the following components:   RBC 5.42 (*)    MCH 24.2 (*)    MCHC 29.8 (*)    RDW 17.0 (*)    All other components within normal limits  POCT I-STAT 7, (LYTES, BLD GAS, ICA,H+H) - Abnormal; Notable for the following components:   pH, Arterial 7.145 (*)    pCO2 arterial 86.1 (*)    pO2, Arterial 255.0 (*)    Bicarbonate 29.6 (*)     All other components within normal limits  TROPONIN I (HIGH SENSITIVITY) - Abnormal; Notable for the following components:   Troponin I (High Sensitivity) 384 (*)    All other components within normal limits  TROPONIN I (HIGH SENSITIVITY) - Abnormal; Notable for the following components:   Troponin I (High Sensitivity) 323 (*)    All other components within normal limits  SARS CORONAVIRUS 2 (HOSPITAL ORDER, [facility])  CULTURE, RESPIRATORY  CULTURE, BLOOD (ROUTINE X 2)  CULTURE, BLOOD (ROUTINE X 2)  HEMOGLOBIN A1C  HIV ANTIBODY (ROUTINE TESTING W REFLEX)  CBC  MAGNESIUM  PHOSPHORUS  BRAIN NATRIURETIC PEPTIDE  D-DIMER, QUANTITATIVE (NOT AT [facility])  BLOOD GAS, ARTERIAL  RAPID URINE DRUG SCREEN, HOSP PERFORMED  DRUG PROFILE, UR, 9 DRUGS ([facility])  LACTIC ACID, PLASMA    EKG None  Radiology Ct Angio Head W Or Wo Contrast  Result Date: 01/07/2019 CLINICAL DATA:  Found on the floor with right facial droop EXAM: CT ANGIOGRAPHY HEAD AND NECK CT PERFUSION BRAIN TECHNIQUE: Multidetector CT imaging of the head and neck was performed using the standard protocol during bolus administration of intravenous contrast. Multiplanar CT image reconstructions and MIPs were obtained to evaluate the vascular anatomy. Carotid stenosis measurements (when applicable) are obtained utilizing NASCET criteria, using the distal internal carotid diameter as the denominator. Multiphase CT imaging of the brain was performed following IV bolus contrast injection. Subsequent parametric perfusion maps were calculated using RAPID software. CONTRAST:  74m OMNIPAQUE IOHEXOL 350 MG/ML SOLN; 755mOMNIPAQUE IOHEXOL 350 MG/ML SOLN COMPARISON:  Head CT earlier same day. FINDINGS: CTA NECK FINDINGS Aortic arch: Aortic atherosclerosis.  No aneurysm or dissection. Right carotid system: Common carotid artery is widely patent to the bifurcation. The carotid bifurcation is normal without stenosis or  irregularity. Cervical ICA is tortuous but  widely patent to the skull base. Left carotid system: Common carotid artery is widely patent to the bifurcation. 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 No antegrade flow to the skull base. Vertebral arteries: Both vertebral artery origins widely patent. Both vertebral arteries are widely patent through the cervical region to the foramen magnum. Skeleton: Normal Other neck: No mass or adenopathy. Enlarged thyroid consistent with goiter. Upper chest: Dependent pulmonary density right worse than left consistent with pneumonia, possibly due to aspiration. Elsewhere there are geographic hazy opacity areas within the lungs. The possibility of pre-existing pneumonia, bacterial or viral, should be considered. Endotracheal tube appears to be just within the proximal left mainstem bronchus. Review of the MIP images confirms the above findings CTA HEAD FINDINGS Anterior circulation: Right internal carotid artery is widely patent through the skull base. Carotid siphon shows atherosclerotic calcification but does not show stenosis greater than 30%. 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 There is no antegrade flow in the proximal siphon. There is reconstitution of the supraclinoid common carotid artery with contributions from external to internal collaterals, patent anterior communicating and patent posterior communicating arteries. Both anterior and middle cerebral arteries show flow. I do not see any large or medium vessel occlusion. Posterior circulation: Both vertebral arteries are patent to the basilar. There is atherosclerotic irregularity of the V4 segments but no stenosis. No basilar stenosis. Superior cerebellar and posterior cerebral arteries show flow. Focal stenosis in the right P1 segment of 50%. Venous sinuses: Patent and normal. Anatomic variants: None significant. Review of the MIP images confirms the above findings CT  Brain Perfusion Findings: ASPECTS: 9 CBF (<30%) Volume: 4m Perfusion (Tmax>6.0s) volume: 347mMismatch Volume: 3166mnfarction Location:Abnormal T-max within the left posterior parietal brain and also within the subcortical white matter front to back. Infarct Core: Core infarct not shown by see BF. Based on low density in the left parieto-occipital brain that is probably a recent infarction, I suspect that we are in a reperfusion window. Although the study suggests a penumbra of 31 cc, the posterior majority of that is probably an actual infarction. 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 Flow in the major left intracranial vessels is demonstrated, consistent with external to internal collateral, anterior communicating and posterior communicating contributions. There is low-density in the left parieto-occipital brain probably representing acute to subacute infarction. No correctable large or medium vessel occlusion is identified presently. Watershed pattern affects the left hemisphere with insult to the deep white matter and the left parieto-occipital cortex. Endotracheal tube in left mainstem bronchus. Dependent pulmonary infiltrate and hazy opacity elsewhere. Findings could be due to aspiration or community acquired bacterial or viral pneumonia. These results were called by telephone at the time of interpretation on 01/06/2019 at 2:58 pm to Dr. MCNRoland Rackwho verbally acknowledged these results. Electronically Signed   By: MarNelson ChimesD.   On: 01/15/2019 15:15   Ct Head Wo Contrast  Result Date: 01/10/2019 CLINICAL DATA:  Unresponsiveness, altered mental status EXAM: CT HEAD WITHOUT CONTRAST TECHNIQUE: Contiguous axial images were obtained from the base of the skull through the vertex without intravenous contrast. COMPARISON:  None. FINDINGS: Brain: No evidence of acute infarction, hemorrhage, hydrocephalus, extra-axial collection or mass lesion/mass effect. Mild  scattered low-density changes within the periventricular and subcortical white matter suggesting chronic microvascular ischemic changes. There is a focal area of encephalomalacia within the right cerebellar hemisphere suggesting prior infarct. Ventricles  are normal in size and configuration. Vascular: No hyperdense vessel or unexpected calcification. Skull: Normal. Negative for fracture or focal lesion. Sinuses/Orbits: No acute finding. Other: Partially visualized nasogastric tube and endotracheal tube. IMPRESSION: 1. No acute intracranial abnormality. 2. 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 Electronically Signed   By: Davina Poke M.D.   On: 01/24/2019 11:42   Ct Angio Neck W Or Wo Contrast  Result Date: 01/16/2019 CLINICAL DATA:  Found on the floor with right facial droop EXAM: CT ANGIOGRAPHY HEAD AND NECK CT PERFUSION BRAIN TECHNIQUE: Multidetector CT imaging of the head and neck was performed using the standard protocol during bolus administration of intravenous contrast. Multiplanar CT image reconstructions and MIPs were obtained to evaluate the vascular anatomy. Carotid stenosis measurements (when applicable) are obtained utilizing NASCET criteria, using the distal internal carotid diameter as the denominator. Multiphase CT imaging of the brain was performed following IV bolus contrast injection. Subsequent parametric perfusion maps were calculated using RAPID software. CONTRAST:  34m OMNIPAQUE IOHEXOL 350 MG/ML SOLN; 768mOMNIPAQUE IOHEXOL 350 MG/ML SOLN COMPARISON:  Head CT earlier same day. FINDINGS: CTA NECK FINDINGS Aortic arch: Aortic atherosclerosis.  No aneurysm or dissection. Right carotid system: Common carotid artery is widely patent to the bifurcation. The carotid bifurcation is normal without stenosis or irregularity. Cervical ICA is tortuous but widely patent to the skull base. Left carotid system: Common carotid artery is widely patent to  the bifurcation. 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 No antegrade flow to the skull base. Vertebral arteries: Both vertebral artery origins widely patent. Both vertebral arteries are widely patent through the cervical region to the foramen magnum. Skeleton: Normal Other neck: No mass or adenopathy. Enlarged thyroid consistent with goiter. Upper chest: Dependent pulmonary density right worse than left consistent with pneumonia, possibly due to aspiration. Elsewhere there are geographic hazy opacity areas within the lungs. The possibility of pre-existing pneumonia, bacterial or viral, should be considered. Endotracheal tube appears to be just within the proximal left mainstem bronchus. Review of the MIP images confirms the above findings CTA HEAD FINDINGS Anterior circulation: Right internal carotid artery is widely patent through the skull base. Carotid siphon shows atherosclerotic calcification but does not show stenosis greater than 30%. 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 There is no antegrade flow in the proximal siphon. There is reconstitution of the supraclinoid common carotid artery with contributions from external to internal collaterals, patent anterior communicating and patent posterior communicating arteries. Both anterior and middle cerebral arteries show flow. I do not see any large or medium vessel occlusion. Posterior circulation: Both vertebral arteries are patent to the basilar. There is atherosclerotic irregularity of the V4 segments but no stenosis. No basilar stenosis. Superior cerebellar and posterior cerebral arteries show flow. Focal stenosis in the right P1 segment of 50%. Venous sinuses: Patent and normal. Anatomic variants: None significant. Review of the MIP images confirms the above findings CT Brain Perfusion Findings: ASPECTS: 9 CBF (<30%) Volume: 17m77merfusion (Tmax>6.0s) volume: 55m19msmatch Volume: 55mL717marction  Location:Abnormal T-max within the left posterior parietal brain and also within the subcortical white matter front to back. Infarct Core: Core infarct not shown by see BF. Based on low density in the left parieto-occipital brain that is probably a recent infarction, I suspect that we are in a reperfusion window. Although the study suggests a penumbra of 31 cc, the posterior majority of that is probably an  actual infarction. 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 Flow in the major left intracranial vessels is demonstrated, consistent with external to internal collateral, anterior communicating and posterior communicating contributions. There is low-density in the left parieto-occipital brain probably representing acute to subacute infarction. No correctable large or medium vessel occlusion is identified presently. Watershed pattern affects the left hemisphere with insult to the deep white matter and the left parieto-occipital cortex. Endotracheal tube in left mainstem bronchus. Dependent pulmonary infiltrate and hazy opacity elsewhere. Findings could be due to aspiration or community acquired bacterial or viral pneumonia. These results were called by telephone at the time of interpretation on 01/26/2019 at 2:58 pm to Dr. Roland Rack , who verbally acknowledged these results. Electronically Signed   By: Nelson Chimes M.D.   On: 01/21/2019 15:15   Ct Cerebral Perfusion W Contrast  Result Date: 12/30/2018 CLINICAL DATA:  Found on the floor with right facial droop EXAM: CT ANGIOGRAPHY HEAD AND NECK CT PERFUSION BRAIN TECHNIQUE: Multidetector CT imaging of the head and neck was performed using the standard protocol during bolus administration of intravenous contrast. Multiplanar CT image reconstructions and MIPs were obtained to evaluate the vascular anatomy. Carotid stenosis measurements (when applicable) are obtained utilizing NASCET criteria, using the distal internal carotid  diameter as the denominator. Multiphase CT imaging of the brain was performed following IV bolus contrast injection. Subsequent parametric perfusion maps were calculated using RAPID software. CONTRAST:  46m OMNIPAQUE IOHEXOL 350 MG/ML SOLN; 732mOMNIPAQUE IOHEXOL 350 MG/ML SOLN COMPARISON:  Head CT earlier same day. FINDINGS: CTA NECK FINDINGS Aortic arch: Aortic atherosclerosis.  No aneurysm or dissection. Right carotid system: Common carotid artery is widely patent to the bifurcation. The carotid bifurcation is normal without stenosis or irregularity. Cervical ICA is tortuous but widely patent to the skull base. Left carotid system: Common carotid artery is widely patent to the bifurcation. 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 No antegrade flow to the skull base. Vertebral arteries: Both vertebral artery origins widely patent. Both vertebral arteries are widely patent through the cervical region to the foramen magnum. Skeleton: Normal Other neck: No mass or adenopathy. Enlarged thyroid consistent with goiter. Upper chest: Dependent pulmonary density right worse than left consistent with pneumonia, possibly due to aspiration. Elsewhere there are geographic hazy opacity areas within the lungs. The possibility of pre-existing pneumonia, bacterial or viral, should be considered. Endotracheal tube appears to be just within the proximal left mainstem bronchus. Review of the MIP images confirms the above findings CTA HEAD FINDINGS Anterior circulation: Right internal carotid artery is widely patent through the skull base. Carotid siphon shows atherosclerotic calcification but does not show stenosis greater than 30%. 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 There is no antegrade flow in the proximal siphon. There is reconstitution of the supraclinoid common carotid artery with contributions from external to internal collaterals, patent anterior communicating and  patent posterior communicating arteries. Both anterior and middle cerebral arteries show flow. I do not see any large or medium vessel occlusion. Posterior circulation: Both vertebral arteries are patent to the basilar. There is atherosclerotic irregularity of the V4 segments but no stenosis. No basilar stenosis. Superior cerebellar and posterior cerebral arteries show flow. Focal stenosis in the right P1 segment of 50%. Venous sinuses: Patent and normal. Anatomic variants: None significant. Review of the MIP images confirms the above findings CT Brain Perfusion Findings: ASPECTS: 9 CBF (<30%) Volume: 21m73m  Perfusion (Tmax>6.0s) volume: 18m Mismatch Volume: 31mInfarction Location:Abnormal T-max within the left posterior parietal brain and also within the subcortical white matter front to back. Infarct Core: Core infarct not shown by see BF. Based on low density in the left parieto-occipital brain that is probably a recent infarction, I suspect that we are in a reperfusion window. Although the study suggests a penumbra of 31 cc, the posterior majority of that is probably an actual infarction. 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 Flow in the major left intracranial vessels is demonstrated, consistent with external to internal collateral, anterior communicating and posterior communicating contributions. There is low-density in the left parieto-occipital brain probably representing acute to subacute infarction. No correctable large or medium vessel occlusion is identified presently. Watershed pattern affects the left hemisphere with insult to the deep white matter and the left parieto-occipital cortex. Endotracheal tube in left mainstem bronchus. Dependent pulmonary infiltrate and hazy opacity elsewhere. Findings could be due to aspiration or community acquired bacterial or viral pneumonia. These results were called by telephone at the time of interpretation on 01/01/2019 at 2:58 pm to  Dr. MCRoland Rack who verbally acknowledged these results. Electronically Signed   By: MaNelson Chimes.D.   On: 01/09/2019 15:15   Dg Chest Portable 1 View  Result Date: 01/22/2019 CLINICAL DATA:  ETT placement EXAM: PORTABLE CHEST 1 VIEW COMPARISON:  July 17, 2018 FINDINGS: ET tube is 1 cm above the level the carina. NG tube is seen coursing below the level of the diaphragm. There is mildly increased interstitial markings seen throughout both lungs with right perihilar/mid lung patchy airspace opacity. Cardiomegaly is seen. No acute osseous abnormality. IMPRESSION: 1. ET tube tip 1 cm above the level of carina. 2. NG tube below the diaphragm 3. nonspecific patchy airspace opacity in the right mid lung could be due to atelectasis and/or early infectious etiology. Electronically Signed   By: BiPrudencio Pair.D.   On: 01/09/2019 11:08    Procedures Procedure Name: Intubation Date/Time: 01/08/2019 12:56 PM Performed by: EaCandie ChromanMD Pre-anesthesia Checklist: Patient identified, Emergency Drugs available, Suction available, Patient being monitored and Timeout performed Oxygen Delivery Method: Non-rebreather mask Preoxygenation: Pre-oxygenation with 100% oxygen Induction Type: IV induction and Rapid sequence Laryngoscope Size: Mac, 3 and Glidescope Grade View: Grade I Tube size: 7.5 mm Number of attempts: 1 Airway Equipment and Method: Rigid stylet and Video-laryngoscopy Placement Confirmation: ETT inserted through vocal cords under direct vision,  Positive ETCO2,  CO2 detector and Breath sounds checked- equal and bilateral Secured at: 24 cm Tube secured with: ETT holder Dental Injury: Teeth and Oropharynx as per pre-operative assessment       (including critical care time)  Medications Ordered in ED Medications  albuterol (VENTOLIN HFA) 108 (90 Base) MCG/ACT inhaler (0 puffs  Hold 01/16/2019 1218)  fentaNYL 250089min NS 250m23m0mc3m) infusion-PREMIX (80 mcg/hr Intravenous  Rate/Dose Change 01/16/2019 1316)  fentaNYL (SUBLIMAZE) bolus via infusion 50 mcg (has no administration in time range)  clevidipine (CLEVIPREX) infusion 0.5 mg/mL (has no administration in time range)  insulin aspart (novoLOG) injection 0-15 Units (has no administration in time range)  heparin injection 5,000 Units (has no administration in time range)  0.9 %  sodium chloride infusion (has no administration in time range)  pantoprazole (PROTONIX) injection 40 mg (has no administration in time range)  chlorhexidine gluconate (MEDLINE KIT) (PERIDEX) 0.12 % solution 15 mL (has no administration in time range)  MEDLINE mouth rinse (  has no administration in time range)  ipratropium-albuterol (DUONEB) 0.5-2.5 (3) MG/3ML nebulizer solution 3 mL (3 mLs Nebulization Not Given 01/04/2019 1457)  bisacodyl (DULCOLAX) suppository 10 mg (has no administration in time range)  arformoterol (BROVANA) nebulizer solution 15 mcg (15 mcg Nebulization Not Given 01/02/2019 1456)  budesonide (PULMICORT) nebulizer solution 0.5 mg (0.5 mg Nebulization Not Given 01/25/2019 1457)  hydrALAZINE (APRESOLINE) injection 20 mg (has no administration in time range)  tirofiban (AGGRASTAT) 5-0.9 MG/100ML-% injection (has no administration in time range)  ticagrelor (BRILINTA) 90 MG tablet (has no administration in time range)  aspirin 325 MG tablet (has no administration in time range)  clopidogrel (PLAVIX) 300 MG tablet (has no administration in time range)  lidocaine (XYLOCAINE) 1 % (with pres) injection (has no administration in time range)  eptifibatide (INTEGRILIN) 20 MG/10ML injection (has no administration in time range)  vancomycin (VANCOCIN) 1-5 GM/200ML-% IVPB (has no administration in time range)  nitroGLYCERIN 100 mcg/mL intra-arterial injection (has no administration in time range)  propofol (DIPRIVAN) 1000 MG/100ML infusion (  Stopped 01/22/2019 1335)  sodium chloride 0.9 % bolus 500 mL (500 mLs Intravenous New Bag/Given 12/28/2018 1100)    etomidate (AMIDATE) injection (20 mg Intravenous Given 01/17/2019 1140)  succinylcholine (ANECTINE) injection (100 mg Intravenous Given 01/15/2019 1142)  propofol (DIPRIVAN) 1000 MG/100ML infusion (5 mcg/kg/min  111 kg (Order-Specific) Intravenous New Bag/Given 01/05/2019 1143)  fentaNYL (SUBLIMAZE) injection 50 mcg (100 mcg Intravenous Given 01/08/2019 1524)  propofol (DIPRIVAN) 1000 MG/100ML infusion (  Override pull for Anesthesia 12/29/2018 1522)  iohexol (OMNIPAQUE) 350 MG/ML injection 50 mL (50 mLs Intravenous Contrast Given 12/30/2018 1425)  iohexol (OMNIPAQUE) 350 MG/ML injection 75 mL (75 mLs Intravenous Contrast Given 01/14/2019 1417)     Initial Impression / Assessment and Plan / ED Course  I have reviewed the triage vital signs and the nursing notes.  Pertinent labs & imaging results that were available during my care of the patient were reviewed by me and considered in my medical decision making (see chart for details).        Alejandra Vega is a 63 y.o. female with history of obesity and hypertension presenting to the ED via EMS with complaints of right-sided weakness, right-sided facial droop, and shortness of breath.  On arrival to the ED, patient is in respiratory distress, is tachypneic, has irregular respirations, and is requiring a nonrebreather to maintain her oxygen saturation.  She is tachycardic to the 140s and is hypertensive.  Differential diagnosis includes ischemic stroke, hemorrhagic stroke, traumatic brain injury from fall, hypertensive emergency, and hypertensive encephalopathy.  Patient was intubated for respiratory distress and airway protection.  CT of the head was promptly obtained which showed no acute intracranial abnormalities.  Chest x-ray post intubation showed ET tube was 1 cm above the carina.  The tube was retracted by respiratory therapy by 1 cm.  Following intubation, patient was found to be profoundly hypertensive to the 825O systolic.  Patient was agitated at this  time and reaching for the ETT with her left hand.  Patient was given additional boluses of propofol and was started on fentanyl drip with some improvement of her blood pressure and agitation.  Cleviprex drip was ordered with goal blood pressure less than 037 systolic but her blood pressure came down with sedation prior to initiation of the drip.  CMP and CBC were unremarkable.  Troponin was elevated to 384, likely secondary to demand ischemia from patient's profound hypertension.  Neurology and critical care medicine were consulted  and evaluated patient in the ED.  Neurology recommended emergent MRI brain which was ordered.  Neurology recommended CTA of head and neck prior to MRI after evaluation of patient.  CTA head and neck showed acute occlusion of the internal carotid artery.  Patient was taken emergently for clot retrieval by interventional radiology and neurology.  Patient will be admitted to the ICU post-procedure.  Final Clinical Impressions(s) / ED Diagnoses   Final diagnoses:  Acute respiratory failure, unspecified whether with hypoxia or hypercapnia (HCC)  Occlusion of left internal carotid artery     Candie Chroman, MD 01/08/2019 1527    Margette Fast, MD 01/19/2019 [phone]

## 2018-12-31 NOTE — ED Notes (Signed)
Restraints checked; CMS intact.

## 2018-12-31 NOTE — Progress Notes (Signed)
Patient ID: Alejandra Vega, female   DOB: June 21, 1955, 63 y.o.   MRN: [id number] INR. 47 Y F MRSS 0 LSW ?10 am New onset  Severe RT sided weakness. CT brain No ICH ASPECTS 10  CTA occluded Lt ICA intracranially and extracranially. CTP No core with a penumbra of 31 ml. Option of endovascular revascularization of occluded Lt ICA D/W daughter. Reasons ,risks,alternatives reviewed. Risks of ICH odh 10 % ,worsening neuro deficit,death inability to revascularize,vascular injury reviewed. Daughter expressed understanding and provided consent to proceed with the treatment. S.Devshwar MD.

## 2018-12-31 NOTE — Progress Notes (Signed)
ABG results given to ED MD. RR increased to 24

## 2018-12-31 NOTE — Consult Note (Signed)
INR. Post treatment CT brain shows no ICH or mass effect or shift. Patient left  intubated per anesthesia. Rt groin hemostasis with an 72F angioseal and Lt femoral hemostasis with a 75F exoseal . Distal pulses dopplerable PTs and DP s bilaterally ,unchanged. S.Alessandria Henken MD

## 2018-12-31 NOTE — ED Triage Notes (Addendum)
Called for sob this am, found oin floor prone altered retractions , fell this am hit her  Head has facial droop rt sided facial droop and drift UNK time, pt arrives on no rebreather wir=th pulse ox 98

## 2018-12-31 NOTE — Sedation Documentation (Signed)
Report called to Merrily Pew, RN, ICU. No foley catheter needed per Merrily Pew, RN, ICU.

## 2018-12-31 NOTE — ED Notes (Signed)
Sealed, 158mcgs Fentanyl vial returned to ED pyxis. Unresolved waste remained after medication was returned. Pharmacy and CN notified.

## 2018-12-31 NOTE — H&P (Addendum)
NAME:  Alejandra Vega, MRN:  [id number], DOB:  [birth year], LOS: 0 ADMISSION DATE:  01/15/2019, CONSULTATION DATE:  8/4 REFERRING MD:  Long, CHIEF COMPLAINT:  Acute encephalopathy and respiratory failure    Brief History   63 year old female patient admitted 8/4 with acute mental status change, right-sided weakness, and respiratory failure -Initial CT brain negative for bleed Initial blood pressure 269/193  History of present illness   63 year old obese black female with history of asthma, obesity and hypertension.  Per review of medical records was in usual state of health until the a.m. of 8/4.  During the a.m. hours she called her daughter reporting right-sided weakness, and also a fall at home.  She also endorsed shortness of breath.  Is unclear from history when this started.  She apparently just kept repeating "I cannot breathe ".  EMS was called on arrival she was found facedown on the floor, demonstrated right-sided weakness.  She was transported to the emergency room found by ER provider staff to be in acute distress.  She was intubated, initial .Postintubation blood pressure 269/193; she was sedated post intubation brought for CT of brain which was negative for acute bleed critical care asked to admit.  Past Medical History  Moderate intermittent asthma w/  20% improvement in FEV1  bronchodilator response, obesity,Hypertension, gastric ulcers, anxiety, and anemia.  Significant Hospital Events   8/4 admitted w/ AMS, right sided weakness and respiratory failure   Consults:  Neuro 8/4  Procedures:  OETT 8/4>>>  Significant Diagnostic Tests:  CT brain 8/4: negative for bleed MRI brain 8/4>>> EEG 8/4>>> UDS 8/4>>>  Micro Data:  BCX2 8/4>>> Sputum 8/4>>> Antimicrobials:    Interim history/subjective:  Currently sedated on propofol and fentanyl infusion.  Purposeful on the left, posturing on the right.  Objective   Blood pressure (Abnormal) 196/111, pulse 96, resp. rate  (Abnormal) 22, height 5\' 1"  (1.549 m), SpO2 100 %.    Vent Mode: PRVC FiO2 (%):  [100 %] 100 % Set Rate:  [16 bmp-24 bmp] 24 bmp Vt Set:  [390 mL] 390 mL PEEP:  [5 cmH20] 5 cmH20  No intake or output data in the 24 hours ending 01/23/2019 1252 There were no vitals filed for this visit.  Examination: General: Obese 63 year old black female currently sedated, tachypneic, intubated and hypertensive HENT: Normocephalic atraumatic orally intubated no JVD mucous membranes moist pupils equal reactive Lungs: Scattered rhonchi no accessory use Cardiovascular: Tachycardic regular rate and rhythm Abdomen: Soft nontender Extremities: Warm and dry brisk capillary refill Neuro: Purposeful on the left, right-sided weakness, with posturing noted on the right GU: Due to void  Resolved Hospital Problem list     Assessment & Plan:  Acute metabolic encephalopathy.  Etiology currently is unclear differential diagnosis includes PRES, hypertensive emergency, stroke or also consider postictal state -initial CT negative for bleed -Seen by neurology in emergency room Plan Admit to intensive care Serial neuro checks MRI brain EEG to be determined by neurology PAD protocol with RASS goal of 0-1 after MRI completed Blood cultures Urine drug screen  Hypertensive emergency  Plan Aggressive BP control. Aiming for SBP < 170 or DBP <110 Started on propofol and fentanyl, this is already because some reduction in blood pressure Will add PRN Hydralazine  Will hold off on labetalol given history of reactive airway disease Cycle cardiac enzymes Echocardiogram Will need CT angio of chest    Acute hypoxic respiratory failure.   Post intubation arterial blood gas with respiratory acidosis . History  of intermittent asthma  suspect this is multifactorial. Seems as though this was initially driven by hypertensive crisis.  Portable chest x-ray personally reviewed post intubation shows endotracheal tube in  satisfactory position there is some nonspecific right-sided airspace disease particular in the right lower mid lung this could reflect aspiration event versus simply atelectasis. -Minute ventilation adjusted post arterial blood gas Plan Full ventilator support Follow-up ABG now at minute ventilation has been adjusted Respiratory culture Scheduled budesonide, Brovana and DuoNeb Trend fever and white blood cell curve A.m. chest x-ray Will hold off on empiric antibiotics for now  Acute kidney injury -Appears as though baseline serum creatinine 0.81 currently 1.29 -Suspect this reflects an organ injury from hypertension Plan Aggressively treat hypertension Serial chemistries Renal dose medications Will give IVFs for now given planned IV dye load Strict intake and output  Hyperglycemia Plan Check hemoglobin A1c Sliding-scale insulin     Best practice:  Diet: N.p.o. Pain/Anxiety/Delirium protocol (if indicated): 8/4 VAP protocol (if indicated): 8/4 DVT prophylaxis: Subcutaneous heparin GI prophylaxis: PPI Glucose control: 8/4 Mobility: Bedrest, advance as tolerated Code Status: Full code Family Communication: Pending Disposition: Admit to the intensive care.  Critically ill with working diagnosis of acute metabolic encephalopathy of unclear etiology, rule out PRES versus hypertensive crisis versus acute stroke versus postictal state/seizure disorder.  MRI is pending.  Neurology has been consulted.  Currently working on aggressive blood pressure management   Labs   CBC: Recent Labs  Lab 01/04/2019 1054 01/21/2019 1214  WBC 8.5  --   NEUTROABS 5.0  --   HGB 13.1 13.3  HCT 43.9 39.0  MCV 81.0  --   PLT 395  --     Basic Metabolic Panel: Recent Labs  Lab 01/02/2019 1054 01/23/2019 1214  NA 140 139  K 4.2 3.9  CL 103  --   CO2 25  --   GLUCOSE 209*  --   BUN 19  --   CREATININE 1.29*  --   CALCIUM 9.6  --    GFR: CrCl cannot be calculated (Unknown ideal weight.).  Recent Labs  Lab 01/01/2019 1054  WBC 8.5    Liver Function Tests: Recent Labs  Lab 01/09/2019 1054  AST 35  ALT 53*  ALKPHOS 99  BILITOT 1.1  PROT 7.9  ALBUMIN 4.1   No results for input(s): LIPASE, AMYLASE in the last 168 hours. No results for input(s): AMMONIA in the last 168 hours.  ABG    Component Value Date/Time   PHART 7.145 (LL) 01/24/2019 1214   PCO2ART 86.1 (HH) 01/22/2019 1214   PO2ART 255.0 (H) 01/03/2019 1214   HCO3 29.6 (H) 01/02/2019 1214   TCO2 32 01/07/2019 1214   ACIDBASEDEF 2.0 12/28/2018 1214   O2SAT 100.0 01/24/2019 1214     Coagulation Profile: No results for input(s): INR, PROTIME in the last 168 hours.  Cardiac Enzymes: No results for input(s): CKTOTAL, CKMB, CKMBINDEX, TROPONINI in the last 168 hours.  HbA1C: No results found for: HGBA1C  CBG: No results for input(s): GLUCAP in the last 168 hours.  Review of Systems:   Not able  Past Medical History  She,  has a past medical history of Allergy, Anemia, Anxiety, Asthma, Gastric ulcer (2013), Heart murmur, and Hypertension.   Surgical History    Past Surgical History:  Procedure Laterality Date  . DILATION AND CURETTAGE OF UTERUS     2 times  . miscarriages     had 2  . [place]  2 times  . WISDOM TOOTH EXTRACTION     age 40     Social History   reports that she has never smoked. She has never used smokeless tobacco. She reports that she does not drink alcohol or use drugs.   Family History   Her family history includes Alcohol abuse in her father; Breast cancer in her maternal aunt; Heart disease in her father; Hypertension in her mother. There is no history of Colon cancer, Colon polyps, Esophageal cancer, Rectal cancer, or Stomach cancer.   Allergies Allergies  Allergen Reactions  . Bee Venom Anaphylaxis  . Glutethimides Shortness Of Breath  . Penicillins Hives, Shortness Of Breath, Itching and Rash  . Shrimp [Shellfish Allergy] Anaphylaxis  .  Latex Itching    At point of contact  . Soy Allergy Diarrhea  . Pork-Derived Products Other (See Comments)    Headache      Home Medications  Prior to Admission medications   Medication Sig Start Date End Date Taking? Authorizing Provider  acetaminophen (TYLENOL) 500 MG tablet Take 500 mg by mouth every 8 (eight) hours as needed for mild pain, fever or headache.    Yes [provider]  albuterol (VENTOLIN HFA) 108 (90 Base) MCG/ACT inhaler Inhale 2 puffs into the lungs every 6 (six) hours as needed for wheezing or shortness of breath.  08/15/18  Yes [provider]  beclomethasone (QVAR) 80 MCG/ACT inhaler Inhale 2 puffs into the lungs 2 (two) times daily. 12/04/18  Yes Rigoberto Noel, MD  cetirizine (ZYRTEC) 10 MG tablet Take 10 mg by mouth daily.   Yes [provider]  Cyanocobalamin (VITAMIN B 12 PO) Take 1 tablet by mouth daily.    Yes [provider]  diphenhydrAMINE (BENADRYL) 25 MG tablet Take 25 mg by mouth as needed for itching or allergies.    Yes [provider]  ipratropium-albuterol (DUONEB) 0.5-2.5 (3) MG/3ML SOLN Take 3 mLs by nebulization every 8 (eight) hours as needed. 07/29/18  Yes [place], Betty G, MD  pantoprazole (PROTONIX) 40 MG tablet Take 1 tablet (40 mg total) by mouth daily. 11/27/18  Yes Rigoberto Noel, MD  Spacer/Aero-Holding Chambers (AEROCHAMBER PLUS) inhaler Use as instructed 04/28/16  Yes [place], Betty G, MD  atorvastatin (LIPITOR) 20 MG tablet Take 1 tablet (20 mg total) by mouth daily. Patient not taking: Reported on 12/29/2018 09/26/18 12/25/18  Daune Perch, NP     Critical care time: 38 minutes    Erick Colace ACNP-BC [facility] Pager # [phone] OR # [phone] if no answer

## 2018-12-31 NOTE — Consult Note (Addendum)
Neurology Consultation  Reason for Consult: hypertensive urgency versus stroke Referring Physician: Long   History is obtained from: Physician  Last seen 10 AM this AM TPA not given secondary to no last seen normal MRSA 3  HPI: Alejandra Vega is a 63 y.o. female with hypertension, heart murmur, asthma, anxiety and anemia with pregnancy.  Per emergency room MD, the history is that of patient had fallen to the right to the point where she did leave a dent in the drywall.  At that point patient called  her daughter 10 am and stated that she was short of breath.  EMS arrived and noted that her blood pressure was significantly high.  Upon entering the ED she continued to complain that she was short of breath.  Blood pressure was found to be as high as 178/113 and 196/111.   I called the daughter to see/gather more information.  Additional information that I did gather, was that she recently  started seeing pulmonologist placed on albuterol however did not seem to help.  Patient continued to be short of breath apparently it was worsening.  ED course CT head/intubation  ROS:  Unable to obtain due to altered mental status.   Past Medical History:  Diagnosis Date  . Allergy   . Anemia    with pregnancy   . Anxiety   . Asthma    as child/ severe  . Gastric ulcer 2013  . Heart murmur   . Hypertension    no meds/ due to stress    Family History  Problem Relation Age of Onset  . Hypertension Mother   . Heart disease Father   . Alcohol abuse Father   . Breast cancer Maternal Aunt   . Colon cancer Neg Hx   . Colon polyps Neg Hx   . Esophageal cancer Neg Hx   . Rectal cancer Neg Hx   . Stomach cancer Neg Hx      Social History:   reports that she has never smoked. She has never used smokeless tobacco. She reports that she does not drink alcohol or use drugs.  Medications  Current Facility-Administered Medications:  .  albuterol (VENTOLIN HFA) 108 (90 Base) MCG/ACT inhaler, ,  , , , Stopped at 12/30/2018 1218 .  clevidipine (CLEVIPREX) infusion 0.5 mg/mL, 0-21 mg/hr, Intravenous, Continuous, Candie Chroman, MD, Stopped at 01/13/2019 1231 .  fentaNYL (SUBLIMAZE) bolus via infusion 50 mcg, 50 mcg, Intravenous, Q1H PRN, Candie Chroman, MD .  fentaNYL (SUBLIMAZE) injection 50 mcg, 50 mcg, Intravenous, Once, Candie Chroman, MD .  fentaNYL 2574mcg in NS 224mL (83mcg/ml) infusion-PREMIX, 25-400 mcg/hr, Intravenous, Continuous, Candie Chroman, MD, Last Rate: 10 mL/hr at 01/13/2019 1241, 100 mcg/hr at 01/10/2019 1241  Current Outpatient Medications:  .  acetaminophen (TYLENOL) 500 MG tablet, Take 500 mg by mouth every 8 (eight) hours as needed for mild pain, fever or headache. , Disp: , Rfl:  .  albuterol (VENTOLIN HFA) 108 (90 Base) MCG/ACT inhaler, Inhale 2 puffs into the lungs every 6 (six) hours as needed for wheezing or shortness of breath. , Disp: , Rfl:  .  beclomethasone (QVAR) 80 MCG/ACT inhaler, Inhale 2 puffs into the lungs 2 (two) times daily., Disp: 1 Inhaler, Rfl: 6 .  cetirizine (ZYRTEC) 10 MG tablet, Take 10 mg by mouth daily., Disp: , Rfl:  .  Cyanocobalamin (VITAMIN B 12 PO), Take 1 tablet by mouth daily. , Disp: , Rfl:  .  diphenhydrAMINE (BENADRYL) 25 MG tablet, Take 25 mg  by mouth as needed for itching or allergies. , Disp: , Rfl:  .  ipratropium-albuterol (DUONEB) 0.5-2.5 (3) MG/3ML SOLN, Take 3 mLs by nebulization every 8 (eight) hours as needed., Disp: 360 mL, Rfl: 1 .  pantoprazole (PROTONIX) 40 MG tablet, Take 1 tablet (40 mg total) by mouth daily., Disp: 30 tablet, Rfl: 0 .  Spacer/Aero-Holding Chambers (AEROCHAMBER PLUS) inhaler, Use as instructed, Disp: 1 each, Rfl: 2 .  atorvastatin (LIPITOR) 20 MG tablet, Take 1 tablet (20 mg total) by mouth daily. (Patient not taking: Reported on 01/10/2019), Disp: 90 tablet, Rfl: 3   Exam: Current vital signs: BP (!) 196/111   Pulse 96   Resp (!) 22   Ht 5\' 1"  (1.549 m)   SpO2 100%   BMI 45.88 kg/m  Vital signs in  last 24 hours: Pulse Rate:  [71-141] 96 (08/04 1230) Resp:  [0-28] 22 (08/04 1230) BP: (117-269)/(86-193) 196/111 (08/04 1230) SpO2:  [80 %-100 %] 100 % (08/04 1230) FiO2 (%):  [100 %] 100 % (08/04 1100)  Physical Exam  Constitutional: Appears well-developed and well-nourished.  Eyes: No scleral injection HENT: No OP obstrucion Head: Normocephalic.  Cardiovascular: Normal rate and regular rhythm.  Respiratory: Effort normal, non-labored breathing GI: Soft.  No distension. There is no tenderness.  Skin: WDI  Neuro: Mental Status: Patient is intubated at this time.  She is breathing over the vent.  She shows spontaneous movement especially on the left.  Only responds to noxious stimuli Cranial Nerves: II: No blink to threat III,IV, VI: I could not elicit a doll's response. Corneal,s intact. Pupils equal, round and reactive to light but right is sluggish V: Per MD who had seen her prior to intubation he feels that she had a right facial droop VII: Unable to assess.  VIII: No response to voice  Motor: Patient has normal tone and bulk.  She is moving left extremities to noxious stimuli with withdrawal and attempts to localize to sternal rub on her left arm.  With noxious stimuli patient locks her knee out, has upgoing toe, and with upper extremities appears to have decerebrate posturing Sensory: Only response to noxious stimuli Deep Tendon Reflexes: I could not elicit any reflexes Plantars: Upgoing on the right downgoing on the left Cerebellar: Unable to assess  Labs I have reviewed labs in epic and the results pertinent to this consultation are:   CBC    Component Value Date/Time   WBC 8.5 01/17/2019 1054   RBC 5.42 (H) 01/24/2019 1054   HGB 13.3 01/20/2019 1214   HCT 39.0 01/12/2019 1214   PLT 395 01/12/2019 1054   MCV 81.0 01/21/2019 1054   MCH 24.2 (L) 01/21/2019 1054   MCHC 29.8 (L) 01/19/2019 1054   RDW 17.0 (H) 01/23/2019 1054   LYMPHSABS 2.4 01/05/2019 1054    MONOABS 0.7 01/23/2019 1054   EOSABS 0.2 01/07/2019 1054   BASOSABS 0.1 12/30/2018 1054    CMP     Component Value Date/Time   NA 139 01/09/2019 1214   K 3.9 01/05/2019 1214   CL 103 01/10/2019 1054   CO2 25 01/19/2019 1054   GLUCOSE 209 (H) 01/01/2019 1054   BUN 19 12/30/2018 1054   CREATININE 1.29 (H) 01/18/2019 1054   CALCIUM 9.6 01/15/2019 1054   PROT 7.9 01/08/2019 1054   ALBUMIN 4.1 01/10/2019 1054   AST 35 01/22/2019 1054   ALT 53 (H) 01/26/2019 1054   ALKPHOS 99 01/23/2019 1054   BILITOT 1.1 01/22/2019 1054  GFRNONAA 44 (L) 12/29/2018 1054   GFRAA 51 (L) 01/23/2019 1054    Lipid Panel     Component Value Date/Time   CHOL 249 (H) 02/19/2018 0921   TRIG 144.0 02/19/2018 0921   HDL 52.30 02/19/2018 0921   CHOLHDL 5 02/19/2018 0921   VLDL 28.8 02/19/2018 0921   LDLCALC 168 (H) 02/19/2018 0921     Imaging I have reviewed the images obtained:  CT-scan of the brain- no acute intracranial abnormalities, mild chronic microvascular ischemic changes including prior right cerebellar infarct  MRI examination of the brain- pending  EEG- pending   CTA head and neck-pending  Alejandra Quill PA-C [facility] [phone]  M-F  (9:00 am- 5:00 PM)  01/07/2019, 12:51 PM   I have seen the patient reviewed the above note.  She has severe right hemiparesis, does not follow commands.  No brainstem findings.  Her CTA reveals a carotid occlusion.  She was not suspected to be an LVO and had no clear last known well and therefore was not activated as a code stroke.  Due to her severe right-sided weakness, however a CTA was obtained which did demonstrate a carotid occlusion.  I discussed with her daughter, she was still talking at 10 AM, though she was already slurred, last known well very much is unclear.  At baseline, she takes care of herself without difficulty, lives alone, but has shortness of breath.  Shortness of breath is responsible for an MRS of  2.  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  The perfusion imaging does reveal an area of penumbra, and though there may be slightly overestimate due to infarct, given the severe symptoms and small area of CT based infarct, I do favor angiogram for consideration of intervention.  She was already intubated for shortness of breath.  I suspect that the shortness of breath was likely secondary to severe hypertension due to the carotid occlusion.  She did have some hypotension following intubation with a drop of blood pressure from the 260s down to 637C systolic.  My suspicion is that this is likely acute on chronic atheromatous disease.  - HgbA1c, fasting lipid panel - MRI of the brain without contrast - Frequent neuro checks - Echocardiogram - Prophylactic therapy-likely dual antiplatelet if carotid stenting is needed - Risk factor modification - Telemetry monitoring - PT consult, OT consult, Speech consult - Stroke team to follow  This patient is critically ill and at significant risk of neurological worsening, death and care requires constant monitoring of vital signs, hemodynamics,respiratory and cardiac monitoring, neurological assessment, discussion with family, other specialists and medical decision making of high complexity. I spent 50 minutes of neurocritical care time  in the care of  this patient. This was time spent independent of any time provided by nurse practitioner or PA.  Roland Rack, MD [facility] [phone]  If 7pm- 7am, please page neurology on call as listed in [facility]. 01/01/2019  3:37 PM

## 2018-12-31 NOTE — Anesthesia Preprocedure Evaluation (Addendum)
Anesthesia Evaluation  Patient identified by MRN, date of birth, ID band Patient unresponsive    Reviewed: Allergy & Precautions, Patient's Chart, lab work & pertinent test results, Unable to perform ROS - Chart review onlyPreop documentation limited or incomplete due to emergent nature of procedure.  Airway Mallampati: Intubated       Dental no notable dental hx.    Pulmonary shortness of breath, asthma ,       + intubated    Cardiovascular hypertension, Pt. on medications  Rhythm:Regular Rate:Normal  ECG: ST, rate 141. Sinus tachycardia LAE, consider biatrial enlargement LVH with secondary repolarization abnormality Prolonged QT interval   Neuro/Psych PSYCHIATRIC DISORDERS Anxiety Code stroke CVA    GI/Hepatic Neg liver ROS, PUD, GERD  Medicated,  Endo/Other  negative endocrine ROS  Renal/GU negative Renal ROS     Musculoskeletal negative musculoskeletal ROS (+)   Abdominal   Peds  Hematology  (+) anemia , HLD   Anesthesia Other Findings Code Stroke  Reproductive/Obstetrics                            Anesthesia Physical Anesthesia Plan  ASA: IV and emergent  Anesthesia Plan: General   Post-op Pain Management:    Induction: Inhalational  PONV Risk Score and Plan: 3 and Ondansetron, Dexamethasone and Treatment may vary due to age or medical condition  Airway Management Planned: Oral ETT  Additional Equipment: Arterial line  Intra-op Plan:   Post-operative Plan: Post-operative intubation/ventilation  Informed Consent:     History available from chart only and Only emergency history available  Plan Discussed with: CRNA  Anesthesia Plan Comments: (Code stroke, patient arrived to IR suite intubated, sedated.  Pre-op evaluation completed after induction due to emergent nature of procedure.)       Anesthesia Quick Evaluation

## 2018-12-31 NOTE — ED Notes (Signed)
Pt placed in 2-point soft restraints on wrists per EDP to prevent pt from pulling at ET tube.

## 2018-12-31 NOTE — Progress Notes (Signed)
Patient transported from IR to 7H29 without complications.

## 2018-12-31 NOTE — ED Notes (Signed)
Soft restraints removed when pt arrived at IR. CMS remained intact for duration of restraint application.

## 2018-12-31 NOTE — Sedation Documentation (Signed)
Bedside report updated to Merrily Pew, RN, ICU.

## 2018-12-31 NOTE — Anesthesia Postprocedure Evaluation (Signed)
Anesthesia Post Note  Patient: Alejandra Vega  Procedure(s) Performed: CODE STROKE (N/A )     Patient location during evaluation: SICU Anesthesia Type: General Level of consciousness: sedated Pain management: pain level controlled Vital Signs Assessment: post-procedure vital signs reviewed and stable Respiratory status: patient remains intubated per anesthesia plan Cardiovascular status: stable Postop Assessment: no apparent nausea or vomiting Anesthetic complications: no    Last Vitals:  Vitals:   12/28/2018 1829 01/10/2019 1830  BP:  (!) 145/89  Pulse:  (!) 59  Resp:  (!) 24  Temp:  36.4 C  SpO2: 100% 100%    Last Pain:  Vitals:   01/23/2019 1830  TempSrc: Oral  PainSc:                  Emeri Estill DANIEL

## 2018-12-31 NOTE — ED Notes (Signed)
Pt Report given to IR team.

## 2018-12-31 NOTE — Transfer of Care (Signed)
Immediate Anesthesia Transfer of Care Note  Patient: Alejandra Vega  Procedure(s) Performed: CODE STROKE (N/A )  Patient Location: 4N ICU  Anesthesia Type:General  Level of Consciousness: sedated and Patient remains intubated per anesthesia plan  Airway & Oxygen Therapy: Patient remains intubated per anesthesia plan and Patient placed on Ventilator (see vital sign flow sheet for setting)  Post-op Assessment: Report given to RN and Post -op Vital signs reviewed and stable  Post vital signs: Reviewed and stable  Last Vitals:  Vitals Value Taken Time  BP 149/87 01/13/2019 1845  Temp    Pulse 57 01/13/2019 1845  Resp 24 01/09/2019 1847  SpO2 100 % 01/07/2019 1845  Vitals shown include unvalidated device data.  Last Pain:  Vitals:   01/27/2019 1031  PainSc: 8          Complications: No apparent anesthesia complications

## 2018-12-31 NOTE — Procedures (Signed)
S/P bilateral common carotid arteriogram followed by complete revascularization of occluded Lt ICA, intracranially and extracranially , and Lt MCA and Lt ACA with x 2 passes with 67mm x 50mm embotrap and x 1 pass with 73mmx 40 mm solitaire FR and x 1 pass with solitaire 46mm x 40 mm X device achieving a TICI 3 revascularization. Arlean Hopping MD

## 2019-01-01 ENCOUNTER — Inpatient Hospital Stay (HOSPITAL_COMMUNITY): Payer: Medicaid Other

## 2019-01-01 ENCOUNTER — Encounter (HOSPITAL_COMMUNITY): Payer: Self-pay | Admitting: Interventional Radiology

## 2019-01-01 DIAGNOSIS — R0602 Shortness of breath: Secondary | ICD-10-CM

## 2019-01-01 LAB — ECHOCARDIOGRAM COMPLETE
Height: 65 in
Weight: 4074.1 oz

## 2019-01-01 LAB — BASIC METABOLIC PANEL
Anion gap: 12 (ref 5–15)
BUN: 17 mg/dL (ref 8–23)
CO2: 20 mmol/L — ABNORMAL LOW (ref 22–32)
Calcium: 8.5 mg/dL — ABNORMAL LOW (ref 8.9–10.3)
Chloride: 106 mmol/L (ref 98–111)
Creatinine, Ser: 1.35 mg/dL — ABNORMAL HIGH (ref 0.44–1.00)
GFR calc Af Amer: 48 mL/min — ABNORMAL LOW (ref >60)
GFR calc non Af Amer: 42 mL/min — ABNORMAL LOW (ref >60)
Glucose, Bld: 144 mg/dL — ABNORMAL HIGH (ref 70–99)
Potassium: 3.4 mmol/L — ABNORMAL LOW (ref 3.5–5.1)
Sodium: 138 mmol/L (ref 135–145)

## 2019-01-01 LAB — GLUCOSE, CAPILLARY
Glucose-Capillary: 105 mg/dL — ABNORMAL HIGH (ref 70–99)
Glucose-Capillary: 160 mg/dL — ABNORMAL HIGH (ref 70–99)
Glucose-Capillary: 124 mg/dL — ABNORMAL HIGH (ref 70–99)
Glucose-Capillary: 116 mg/dL — ABNORMAL HIGH (ref 70–99)
Glucose-Capillary: 97 mg/dL (ref 70–99)
Glucose-Capillary: 96 mg/dL (ref 70–99)

## 2019-01-01 LAB — CBC WITH DIFFERENTIAL/PLATELET
Abs Immature Granulocytes: 0.05 10*3/uL (ref 0.00–0.07)
Basophils Absolute: 0.1 10*3/uL (ref 0.0–0.1)
Basophils Relative: 0 %
Eosinophils Absolute: 0 10*3/uL (ref 0.0–0.5)
Eosinophils Relative: 0 %
HCT: 32.3 % — ABNORMAL LOW (ref 36.0–46.0)
Hemoglobin: 10 g/dL — ABNORMAL LOW (ref 12.0–15.0)
Immature Granulocytes: 0 %
Lymphocytes Relative: 10 %
Lymphs Abs: 1.3 10*3/uL (ref 0.7–4.0)
MCH: 24.9 pg — ABNORMAL LOW (ref 26.0–34.0)
MCHC: 31 g/dL (ref 30.0–36.0)
MCV: 80.5 fL (ref 80.0–100.0)
Monocytes Absolute: 1 10*3/uL (ref 0.1–1.0)
Monocytes Relative: 8 %
Neutro Abs: 10.5 10*3/uL — ABNORMAL HIGH (ref 1.7–7.7)
Neutrophils Relative %: 82 %
Platelets: 292 10*3/uL (ref 150–400)
RBC: 4.01 MIL/uL (ref 3.87–5.11)
RDW: 16.7 % — ABNORMAL HIGH (ref 11.5–15.5)
WBC: 12.9 10*3/uL — ABNORMAL HIGH (ref 4.0–10.5)
nRBC: 0 % (ref 0.0–0.2)

## 2019-01-01 LAB — POCT I-STAT 7, (LYTES, BLD GAS, ICA,H+H)
Acid-base deficit: 3 mmol/L — ABNORMAL HIGH (ref 0.0–2.0)
Bicarbonate: 22.2 mmol/L (ref 20.0–28.0)
Calcium, Ion: 1.2 mmol/L (ref 1.15–1.40)
HCT: 31 % — ABNORMAL LOW (ref 36.0–46.0)
Hemoglobin: 10.5 g/dL — ABNORMAL LOW (ref 12.0–15.0)
O2 Saturation: 89 %
Patient temperature: 98.6
Potassium: 3.5 mmol/L (ref 3.5–5.1)
Sodium: 140 mmol/L (ref 135–145)
TCO2: 23 mmol/L (ref 22–32)
pCO2 arterial: 38.6 mmHg (ref 32.0–48.0)
pH, Arterial: 7.368 (ref 7.350–7.450)
pO2, Arterial: 59 mmHg — ABNORMAL LOW (ref 83.0–108.0)

## 2019-01-01 LAB — LIPID PANEL
Cholesterol: 146 mg/dL (ref 0–200)
HDL: 30 mg/dL — ABNORMAL LOW (ref >40)
LDL Cholesterol: 77 mg/dL (ref 0–99)
Total CHOL/HDL Ratio: 4.9 RATIO
Triglycerides: 195 mg/dL — ABNORMAL HIGH (ref <150)
VLDL: 39 mg/dL (ref 0–40)

## 2019-01-01 LAB — MRSA PCR SCREENING: MRSA by PCR: NEGATIVE

## 2019-01-01 LAB — HIV ANTIBODY (ROUTINE TESTING W REFLEX): HIV Screen 4th Generation wRfx: NONREACTIVE

## 2019-01-01 MED ORDER — ASPIRIN 300 MG RE SUPP
300.0000 mg | Freq: Every day | RECTAL | Status: DC
  Filled 2019-01-01: qty 1

## 2019-01-01 MED ORDER — ALBUTEROL SULFATE (2.5 MG/3ML) 0.083% IN NEBU: 3.0000 mL | INHALATION_SOLUTION | Freq: Four times a day (QID) | RESPIRATORY_TRACT | Status: DC | PRN

## 2019-01-01 MED ORDER — ASPIRIN 81 MG PO CHEW
81.0000 mg | CHEWABLE_TABLET | Freq: Every day | ORAL | Status: DC
  Administered 2019-01-01 – 2019-01-07 (×7): 81 mg
  Filled 2019-01-01 (×7): qty 1

## 2019-01-01 MED ORDER — LABETALOL HCL 5 MG/ML IV SOLN
0.5000 mg/min | INTRAVENOUS | Status: DC
  Filled 2019-01-01: qty 100

## 2019-01-01 MED ORDER — ATORVASTATIN CALCIUM 10 MG PO TABS
20.0000 mg | ORAL_TABLET | Freq: Every day | ORAL | Status: DC
  Administered 2019-01-01 – 2019-01-03 (×3): 20 mg via ORAL
  Filled 2019-01-01 (×3): qty 2

## 2019-01-01 MED ORDER — ACETAMINOPHEN 500 MG PO TABS: 500.0000 mg | ORAL_TABLET | Freq: Three times a day (TID) | ORAL | Status: DC | PRN

## 2019-01-01 MED ORDER — PANTOPRAZOLE SODIUM 40 MG PO PACK
40.0000 mg | PACK | Freq: Every day | ORAL | Status: DC
  Administered 2019-01-01 – 2019-01-09 (×9): 40 mg
  Filled 2019-01-01 (×8): qty 20

## 2019-01-01 MED ORDER — PANTOPRAZOLE SODIUM 40 MG PO TBEC: 40.0000 mg | DELAYED_RELEASE_TABLET | Freq: Every day | ORAL | Status: DC

## 2019-01-01 NOTE — Progress Notes (Signed)
NAME:  Alejandra Vega, MRN:  [id number], DOB:  1956-02-17, LOS: 1 ADMISSION DATE:  01/17/2019, CONSULTATION DATE:  01/01/2019 REFERRING MD:  Dr. Leonie Man CHIEF COMPLAINT: Respiratory failure  Brief History   Patient is 63 year old woman w/ PMH of asthma, obesity HTN who was admitted for AMS, SOB, and right-sided weakness and found to have hypertensive urgency and occluded L ICA, intracranially and extracranially, L MCA and L ACA. S/p bilateral common carotid arteriogram followed by complete TICI 3 revascularization, now on vent and weaning.  PCCM consulted for vent management  History of present illness   Patient presents to the ED on 8/4 initially with confusion, fall, and reported R-sided weakness. Noted to have SOB on arrival with EMS. Unknown last normal time or timeframe for symptoms., but out of window for tPA. Patient acutely worsening in the ED with significant increased SOB and HTN. Patient was intubated and taken emergently for CT head to r/o bleed which was negative. Postintubation blood pressure 269/193. CTA head/neck shows acute carotid occlusion and patient taken emergently for revascularization. Thrombectomy of the occluded L ICA achieved complete TICI 3 revascularization. Neuro and PCCM consulted.   Past Medical History  Moderate intermittent asthma w/ 20% improvement in FEV1 bronchodilator response, obesity, HTN, gastric ulcers, anxiety, and anemia.  Significant Hospital Events   8/4 <<< Intubated in ED 8/4 <<< thrombectomy of occluded L ICA w/ complete TICI 3 revascularization  Consults:  Neuro 8/4 PCCM 8/4  Procedures:  Thrombectomy 8/4  Significant Diagnostic Tests:  CXR 8/4 << ET tube tip 1 cm above the level of carina. NG tube below the diaphragm. Nonspecific patchy airspace opacity in the right mid lung likely atelectasis and/or early infectious etiology CT head wo contrast 8/4 << No acute intracranial abnormality. Mild chronic microvascular ischemic changes  including prior right cerebellar infarct. CTA Neck 8/4 << Aortic atherosclerosis. No aneurysm or dissection. Occluded L ICA. Patent R ICA and vertebral arteries CTA Head 8/4 << Focal stenosis in the right P1 segment of 50%. Acute/subacute infarction left parieto-occipital brain  Brain MRI 8/5 << Multifocal acute ischemia, mostly L hemisphere, largest infarct in L ACA territory, chronic ischemic microangiopathy and multiple chronic hypertensive micro bleeds. Old R cerebellar and L occipital lobe infarcts. CXR 8/5 << L lower lobe atelectasis. No pleural effusion or pneumothorax. ECHO 8/5 <<< Pending  Micro Data:  SARS Coronavirus 2 8/4 <<< Negative MRSA PCR 8/4 <<< Negative Respiratory culture 8/4 <<< No growth in < 12 hr Gram stain 8/4 <<< Mod WBC, mostly PMN, rare gram + cocci in pairs Blood culture 8/4 <<< pending  Antimicrobials:    Interim history/subjective:  Emergent mechanical thrombectomy achieving a TICI 3 revascularization yesterday by Dr. Estanislado Pandy w/ post treatment CT brain that showed no ICH or mass effect or shift. ETT tube pulled back 2 cm. NO acute events overnight. BP stable overnight with sBP in 110s-130s. This AM, still on vent and sedated. Spontaneously move left side and RLE but no spontaneous movements. Off fentanyl and down to propofol 20 mcg for sedation. Plan to wean today.  Objective   Blood pressure 128/65, pulse 85, temperature 98.7 F (37.1 C), temperature source Axillary, resp. rate (!) 22, height 5\' 5"  (1.651 m), weight 115.5 kg, SpO2 100 %.    Vent Mode: PSV;CPAP FiO2 (%):  [40 %-100 %] 40 % Set Rate:  [16 bmp-24 bmp] 24 bmp Vt Set:  [390 mL] 390 mL PEEP:  [5 cmH20] 5 cmH20 Pressure Support:  [10  cmH20] [place] Pressure:  [22 cmH20-26 cmH20] 22 cmH20   Intake/Output Summary (Last 24 hours) at 01/01/2019 0921 Last data filed at 01/01/2019 0700 Gross per 24 hour  Intake 2365.95 ml  Output 150 ml  Net 2215.95 ml   Filed Weights   01/04/2019 1830  01/01/19 0448  Weight: 113 kg 115.5 kg    Examination: General: Intubated with sedation. NAD HEENT: [place]/AT. Neck supple Lungs: On vent PS/CPAP. Coarse lung sounds. No increased WOB Cardiovascular: RRR no m/r/g Abdomen: Soft, mildly distended (likely body habitus) Skin: R groin incision soft w/o active bleeding or hematoma. Extremities: Distal pulses palpable bilaterally with Doppler Neuro: On vent and sedation. Eyes closed w/ bilateral pupil unreactive to light. Can spontaneously move left side and RLE but no spontaneous movements of RUE.    Resolved Hospital Problem list     Assessment & Plan:  Acute Respiratory failure. - Hx of intermittent asthma on Symbicort - Likely multifactorial with element of worsening respiratory disease and decreased cardiac output in the setting of long-standing HTN. - CXR w/ L lower lobe atelectasis - ABG improved w/ normal pH, normal pCO2, low pO2 (59) - On PS/CPAP with pressure support of 5 mmHg, RR 20-25, FiO2 40, PEEP 5 - On propofol 20 mcg/kg/min, weaning off sedation - Plan to extubate as tolerated today - Continue scheduled budesonide, Brovana and DuoNeb - Neg sputum culture, no indication for abx today, F/u bcx - BNP 2574, elevated D-dimer, normal lactate - F/u ECHO - Daily ABGs - F/u outpatient PFT  Left Hemispheric Stroke.  - Left ACA infarct s/p IR w/ TICI3 revascularization of occluded L PROXIMAL ICA, L MCA and L ACA - Severe right hemapheresis on exam - Likely 2/2 to hypertensive emergency vs. thromboemboli - EEG pending - LDL 77, A1C 5.7 - Continue heparin 5000 units sq tid for VTE prophylaxis - Stroke team following, appreciate recs  Hypertensive emergency, Hx of HTN. - BP elevated to 269/193 on arrival - Aggressive BP control w/ goal of SBP < 170 or DBP <110 - Continue Cleviprex 13 mg/hr w/ PRN Hydralazine - AM trops  - F/u ECHO - CT angio chest s/p extubation  Hyperglycemia. - Mild elevation in glucose, 124 - SSI   AKI.  - Cr of 1.35 <-- 1.29 - Unclear baseline - Avoid nephrotoxic agents - Daily BMP  Anemia. - Hgb down to 10 from 13.3 on arrival - S/p ICA thrombectomy - R groin incision soft w/o active bleeding or hematoma. - Hx of anemia  - IR following - Daily CBC  Best practice:  Diet: Tube  Pain/Anxiety/Delirium protocol (if indicated): Propofol VAP protocol (if indicated): N/A DVT prophylaxis: SCDs GI prophylaxis: PPI Glucose control: SSI Mobility: Bedrest Code Status: Full Family Communication: discussion with daughter in room Disposition: ICU   Labs   CBC: Recent Labs  Lab 01/19/2019 1054 01/07/2019 1214 12/30/2018 1832 01/01/19 0415 01/01/19 0445  WBC 8.5  --  8.6  --  12.9*  NEUTROABS 5.0  --   --   --  10.5*  HGB 13.1 13.3 9.4* 10.5* 10.0*  HCT 43.9 39.0 30.6* 31.0* 32.3*  MCV 81.0  --  79.9*  --  80.5  PLT 395  --  285  --  235    Basic Metabolic Panel: Recent Labs  Lab 01/16/2019 1054 01/19/2019 1214 01/12/2019 1832 01/01/19 0415 01/01/19 0445  NA 140 139  --  140 138  K 4.2 3.9  --  3.5 3.4*  CL 103  --   --   --  106  CO2 25  --   --   --  20*  GLUCOSE 209*  --   --   --  144*  BUN 19  --   --   --  17  CREATININE 1.29*  --   --   --  1.35*  CALCIUM 9.6  --   --   --  8.5*  MG  --   --  1.9  --   --   PHOS  --   --  4.3  --   --    GFR: Estimated Creatinine Clearance: 54.1 mL/min (A) (by C-G formula based on SCr of 1.35 mg/dL (H)). Recent Labs  Lab 01/08/2019 1054 01/12/2019 1832 01/01/19 0445  WBC 8.5 8.6 12.9*  LATICACIDVEN  --  0.9  --     Liver Function Tests: Recent Labs  Lab 01/09/2019 1054  AST 35  ALT 53*  ALKPHOS 99  BILITOT 1.1  PROT 7.9  ALBUMIN 4.1   No results for input(s): LIPASE, AMYLASE in the last 168 hours. No results for input(s): AMMONIA in the last 168 hours.  ABG    Component Value Date/Time   PHART 7.368 01/01/2019 0415   PCO2ART 38.6 01/01/2019 0415   PO2ART 59.0 (L) 01/01/2019 0415   HCO3 22.2 01/01/2019 0415    TCO2 23 01/01/2019 0415   ACIDBASEDEF 3.0 (H) 01/01/2019 0415   O2SAT 89.0 01/01/2019 0415     Coagulation Profile: No results for input(s): INR, PROTIME in the last 168 hours.  Cardiac Enzymes: No results for input(s): CKTOTAL, CKMB, CKMBINDEX, TROPONINI in the last 168 hours.  HbA1C: Hgb A1c MFr Bld  Date/Time Value Ref Range Status  01/06/2019 06:32 PM 5.7 (H) 4.8 - 5.6 % Final    Comment:    REPEATED TO VERIFY (NOTE) Pre diabetes:          5.7%-6.4% Diabetes:              >6.4% Glycemic control for   <7.0% adults with diabetes     CBG: Recent Labs  Lab 01/09/2019 2122 12/30/2018 2323 01/01/19 0322 01/01/19 0749  GLUCAP 140* 123* 105* 160*    Linwood Dibbles, MS4

## 2019-01-01 NOTE — Progress Notes (Signed)
ETT pulled back 2cm per order. ETT tube is now at 23 at the lip.

## 2019-01-01 NOTE — Progress Notes (Signed)
Wasted 125 ml of fentanyl in the sink. Witnessed by Alphonsa Overall., RN

## 2019-01-01 NOTE — Progress Notes (Signed)
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 Estanislado Pandy. Patient laying in bed intubated with sedation. Can spontaneously move left side and RLE but no spontaneous movements of RUE. Right groin incision c/d/i.  MR brain this AM: 1. Multifocal acute ischemia, predominantly within the left hemisphere, with the largest area of infarct located in the left anterior cerebral artery territory. 2. 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 3. 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   Allergies: Bee venom, Glutethimides, Penicillins, Shrimp [shellfish allergy], Latex, Soy allergy, and Pork-derived products  Medications: Prior to Admission medications   Medication Sig Start Date End Date Taking? Authorizing Provider  acetaminophen (TYLENOL) 500 MG tablet Take 500 mg by mouth every 8 (eight) hours as needed for mild pain, fever or headache.    Yes [provider]  albuterol (VENTOLIN HFA) 108 (90 Base) MCG/ACT inhaler Inhale 2 puffs into the lungs every 6 (six) hours as needed for wheezing or shortness of breath.  08/15/18  Yes [provider]  beclomethasone (QVAR) 80 MCG/ACT inhaler Inhale 2 puffs into the lungs 2 (two) times daily. 12/04/18  Yes Rigoberto Noel, MD  cetirizine (ZYRTEC) 10 MG tablet Take 10 mg by mouth daily.   Yes [provider]  Cyanocobalamin (VITAMIN B 12 PO) Take 1 tablet by mouth daily.    Yes [provider]  diphenhydrAMINE (BENADRYL) 25 MG tablet Take 25 mg by mouth as needed for itching or allergies.    Yes [provider]  ipratropium-albuterol (DUONEB)  0.5-2.5 (3) MG/3ML SOLN Take 3 mLs by nebulization every 8 (eight) hours as needed. 07/29/18  Yes Martinique, Betty G, MD  pantoprazole (PROTONIX) 40 MG tablet Take 1 tablet (40 mg total) by mouth daily. 11/27/18  Yes Rigoberto Noel, MD  Spacer/Aero-Holding Chambers (AEROCHAMBER PLUS) inhaler Use as instructed 04/28/16  Yes [place], Betty G, MD  atorvastatin (LIPITOR) 20 MG tablet Take 1 tablet (20 mg total) by mouth daily. Patient not taking: Reported on 01/26/2019 09/26/18 12/25/18  Daune Perch, NP     Vital Signs: BP 118/60    Pulse 86    Temp 98.7 F (37.1 C) (Axillary)    Resp 20    Ht 5\' 5"  (1.651 m)    Wt 254 lb 10.1 oz (115.5 kg)    SpO2 100%    BMI 42.37 kg/m   Physical Exam Vitals signs and nursing note reviewed.  Constitutional:      General: She is not in acute distress.    Comments: Intubated with sedation.  Pulmonary:     Effort: Pulmonary effort is normal. No respiratory distress.     Comments: Intubated with sedation. Skin:    General: Skin is warm and dry.     Comments: Right groin incision soft without active bleeding or hematoma.  Neurological:     Comments: Intubated with sedation. PERRL bilaterally. Can spontaneously move left side and RLE but no spontaneous movements of RUE. Distal pulses palpable bilaterally with Doppler.     Imaging: Ct Angio Head W Or Wo Contrast  Result Date: 01/21/2019 CLINICAL DATA:  Found on the floor with right facial droop EXAM: CT ANGIOGRAPHY HEAD AND NECK CT PERFUSION BRAIN TECHNIQUE: Multidetector  CT imaging of the head and neck was performed using the standard protocol during bolus administration of intravenous contrast. Multiplanar CT image reconstructions and MIPs were obtained to evaluate the vascular anatomy. Carotid stenosis measurements (when applicable) are obtained utilizing NASCET criteria, using the distal internal carotid diameter as the denominator. Multiphase CT imaging of the brain was performed following IV bolus contrast  injection. Subsequent parametric perfusion maps were calculated using RAPID software. CONTRAST:  32mL OMNIPAQUE IOHEXOL 350 MG/ML SOLN; 29mL OMNIPAQUE IOHEXOL 350 MG/ML SOLN COMPARISON:  Head CT earlier same day. FINDINGS: CTA NECK FINDINGS Aortic arch: Aortic atherosclerosis.  No aneurysm or dissection. Right carotid system: Common carotid artery is widely patent to the bifurcation. The carotid bifurcation is normal without stenosis or irregularity. Cervical ICA is tortuous but widely patent to the skull base. Left carotid system: Common carotid artery is widely patent to the bifurcation. 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 No antegrade flow to the skull base. Vertebral arteries: Both vertebral artery origins widely patent. Both vertebral arteries are widely patent through the cervical region to the foramen magnum. Skeleton: Normal Other neck: No mass or adenopathy. Enlarged thyroid consistent with goiter. Upper chest: Dependent pulmonary density right worse than left consistent with pneumonia, possibly due to aspiration. Elsewhere there are geographic hazy opacity areas within the lungs. The possibility of pre-existing pneumonia, bacterial or viral, should be considered. Endotracheal tube appears to be just within the proximal left mainstem bronchus. Review of the MIP images confirms the above findings CTA HEAD FINDINGS Anterior circulation: Right internal carotid artery is widely patent through the skull base. Carotid siphon shows atherosclerotic calcification but does not show stenosis greater than 30%. 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 There is no antegrade flow in the proximal siphon. There is reconstitution of the supraclinoid common carotid artery with contributions from external to internal collaterals, patent anterior communicating and patent posterior communicating arteries. Both anterior and middle cerebral arteries show flow. I do not see any  large or medium vessel occlusion. Posterior circulation: Both vertebral arteries are patent to the basilar. There is atherosclerotic irregularity of the V4 segments but no stenosis. No basilar stenosis. Superior cerebellar and posterior cerebral arteries show flow. Focal stenosis in the right P1 segment of 50%. Venous sinuses: Patent and normal. Anatomic variants: None significant. Review of the MIP images confirms the above findings CT Brain Perfusion Findings: ASPECTS: 9 CBF (<30%) Volume: 49mL Perfusion (Tmax>6.0s) volume: 33mL Mismatch Volume: 85mL Infarction Location:Abnormal T-max within the left posterior parietal brain and also within the subcortical white matter front to back. Infarct Core: Core infarct not shown by see BF. Based on low density in the left parieto-occipital brain that is probably a recent infarction, I suspect that we are in a reperfusion window. Although the study suggests a penumbra of 31 cc, the posterior majority of that is probably an actual infarction. 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 Flow in the major left intracranial vessels is demonstrated, consistent with external to internal collateral, anterior communicating and posterior communicating contributions. There is low-density in the left parieto-occipital brain probably representing acute to subacute infarction. No correctable large or medium vessel occlusion is identified presently. Watershed pattern affects the left hemisphere with insult to the deep white matter and the left parieto-occipital cortex. Endotracheal tube in left mainstem bronchus. Dependent pulmonary infiltrate and hazy opacity elsewhere. Findings could be due to aspiration or community acquired bacterial  or viral pneumonia. These results were called by telephone at the time of interpretation on 01/09/2019 at 2:58 pm to Dr. Roland Rack , who verbally acknowledged these results. Electronically Signed   By: Nelson Chimes M.D.    On: 01/16/2019 15:15   Ct Head Wo Contrast  Result Date: 01/01/2019 CLINICAL DATA:  Unresponsiveness, altered mental status EXAM: CT HEAD WITHOUT CONTRAST TECHNIQUE: Contiguous axial images were obtained from the base of the skull through the vertex without intravenous contrast. COMPARISON:  None. FINDINGS: Brain: No evidence of acute infarction, hemorrhage, hydrocephalus, extra-axial collection or mass lesion/mass effect. Mild scattered low-density changes within the periventricular and subcortical white matter suggesting chronic microvascular ischemic changes. There is a focal area of encephalomalacia within the right cerebellar hemisphere suggesting prior infarct. Ventricles are normal in size and configuration. Vascular: No hyperdense vessel or unexpected calcification. Skull: Normal. Negative for fracture or focal lesion. Sinuses/Orbits: No acute finding. Other: Partially visualized nasogastric tube and endotracheal tube. IMPRESSION: 1. No acute intracranial abnormality. 2. 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 Electronically Signed   By: Davina Poke M.D.   On: 01/25/2019 11:42   Ct Angio Neck W Or Wo Contrast  Result Date: 01/20/2019 CLINICAL DATA:  Found on the floor with right facial droop EXAM: CT ANGIOGRAPHY HEAD AND NECK CT PERFUSION BRAIN TECHNIQUE: Multidetector CT imaging of the head and neck was performed using the standard protocol during bolus administration of intravenous contrast. Multiplanar CT image reconstructions and MIPs were obtained to evaluate the vascular anatomy. Carotid stenosis measurements (when applicable) are obtained utilizing NASCET criteria, using the distal internal carotid diameter as the denominator. Multiphase CT imaging of the brain was performed following IV bolus contrast injection. Subsequent parametric perfusion maps were calculated using RAPID software. CONTRAST:  5mL OMNIPAQUE IOHEXOL 350 MG/ML SOLN; 67mL  OMNIPAQUE IOHEXOL 350 MG/ML SOLN COMPARISON:  Head CT earlier same day. FINDINGS: CTA NECK FINDINGS Aortic arch: Aortic atherosclerosis.  No aneurysm or dissection. Right carotid system: Common carotid artery is widely patent to the bifurcation. The carotid bifurcation is normal without stenosis or irregularity. Cervical ICA is tortuous but widely patent to the skull base. Left carotid system: Common carotid artery is widely patent to the bifurcation. 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 No antegrade flow to the skull base. Vertebral arteries: Both vertebral artery origins widely patent. Both vertebral arteries are widely patent through the cervical region to the foramen magnum. Skeleton: Normal Other neck: No mass or adenopathy. Enlarged thyroid consistent with goiter. Upper chest: Dependent pulmonary density right worse than left consistent with pneumonia, possibly due to aspiration. Elsewhere there are geographic hazy opacity areas within the lungs. The possibility of pre-existing pneumonia, bacterial or viral, should be considered. Endotracheal tube appears to be just within the proximal left mainstem bronchus. Review of the MIP images confirms the above findings CTA HEAD FINDINGS Anterior circulation: Right internal carotid artery is widely patent through the skull base. Carotid siphon shows atherosclerotic calcification but does not show stenosis greater than 30%. 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 There is no antegrade flow in the proximal siphon. There is reconstitution of the supraclinoid common carotid artery with contributions from external to internal collaterals, patent anterior communicating and patent posterior communicating arteries. Both anterior and middle cerebral arteries show flow. I do not see any large or medium vessel occlusion. Posterior circulation: Both vertebral arteries are patent to the basilar. There is  atherosclerotic  irregularity of the V4 segments but no stenosis. No basilar stenosis. Superior cerebellar and posterior cerebral arteries show flow. Focal stenosis in the right P1 segment of 50%. Venous sinuses: Patent and normal. Anatomic variants: None significant. Review of the MIP images confirms the above findings CT Brain Perfusion Findings: ASPECTS: 9 CBF (<30%) Volume: 43mL Perfusion (Tmax>6.0s) volume: 54mL Mismatch Volume: 83mL Infarction Location:Abnormal T-max within the left posterior parietal brain and also within the subcortical white matter front to back. Infarct Core: Core infarct not shown by see BF. Based on low density in the left parieto-occipital brain that is probably a recent infarction, I suspect that we are in a reperfusion window. Although the study suggests a penumbra of 31 cc, the posterior majority of that is probably an actual infarction. 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 Flow in the major left intracranial vessels is demonstrated, consistent with external to internal collateral, anterior communicating and posterior communicating contributions. There is low-density in the left parieto-occipital brain probably representing acute to subacute infarction. No correctable large or medium vessel occlusion is identified presently. Watershed pattern affects the left hemisphere with insult to the deep white matter and the left parieto-occipital cortex. Endotracheal tube in left mainstem bronchus. Dependent pulmonary infiltrate and hazy opacity elsewhere. Findings could be due to aspiration or community acquired bacterial or viral pneumonia. These results were called by telephone at the time of interpretation on 01/04/2019 at 2:58 pm to Dr. Roland Rack , who verbally acknowledged these results. Electronically Signed   By: Nelson Chimes M.D.   On: 01/25/2019 15:15   Mr Brain Wo Contrast  Result Date: 01/01/2019 CLINICAL DATA:  Carotid occlusion and revascularization.   Infarct. EXAM: MRI HEAD WITHOUT CONTRAST TECHNIQUE: Multiplanar, multiecho pulse sequences of the brain and surrounding structures were obtained without intravenous contrast. COMPARISON:  CTA head neck 01/14/2019 FINDINGS: BRAIN: There is multifocal abnormal diffusion restriction within both hemispheres, left worse than right. The largest area of abnormality is within the left anterior cerebral artery territory. There are other areas of diffusion restriction of the left basal ganglia and left medial temporal lobe, as well as within the right hemisphere watershed distribution. There is no midline shift or other mass effect. Early confluent hyperintense T2-weighted signal of the periventricular and deep white matter, most commonly due to chronic ischemic microangiopathy. There are old right cerebellar and left occipital lobe infarcts. No acute hemorrhage. There is generalized atrophy without lobar predilection. The midline structures are normal. VASCULAR: There are multiple chronic microhemorrhages in a predominantly central distribution. The major intracranial arterial and venous sinus flow voids are normal. SKULL AND UPPER CERVICAL SPINE: Calvarial bone marrow signal is normal. There is no skull base mass. The visualized upper cervical spine and soft tissues are normal. SINUSES/ORBITS: There are no fluid levels or advanced mucosal thickening. The mastoid air cells and middle ear cavities are free of fluid. The orbits are normal. IMPRESSION: 1. Multifocal acute ischemia, predominantly within the left hemisphere, with the largest area of infarct located in the left anterior cerebral artery territory. 2. 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 3. 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 Electronically Signed   By: Ulyses Jarred M.D.   On: 01/01/2019 02:31   Ct Cerebral Perfusion W Contrast  Result Date: 01/10/2019 CLINICAL DATA:  Found on the floor with right  facial droop EXAM: CT ANGIOGRAPHY HEAD AND NECK CT PERFUSION BRAIN TECHNIQUE: Multidetector CT imaging of the  head and neck was performed using the standard protocol during bolus administration of intravenous contrast. Multiplanar CT image reconstructions and MIPs were obtained to evaluate the vascular anatomy. Carotid stenosis measurements (when applicable) are obtained utilizing NASCET criteria, using the distal internal carotid diameter as the denominator. Multiphase CT imaging of the brain was performed following IV bolus contrast injection. Subsequent parametric perfusion maps were calculated using RAPID software. CONTRAST:  62mL OMNIPAQUE IOHEXOL 350 MG/ML SOLN; 81mL OMNIPAQUE IOHEXOL 350 MG/ML SOLN COMPARISON:  Head CT earlier same day. FINDINGS: CTA NECK FINDINGS Aortic arch: Aortic atherosclerosis.  No aneurysm or dissection. Right carotid system: Common carotid artery is widely patent to the bifurcation. The carotid bifurcation is normal without stenosis or irregularity. Cervical ICA is tortuous but widely patent to the skull base. Left carotid system: Common carotid artery is widely patent to the bifurcation. 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 No antegrade flow to the skull base. Vertebral arteries: Both vertebral artery origins widely patent. Both vertebral arteries are widely patent through the cervical region to the foramen magnum. Skeleton: Normal Other neck: No mass or adenopathy. Enlarged thyroid consistent with goiter. Upper chest: Dependent pulmonary density right worse than left consistent with pneumonia, possibly due to aspiration. Elsewhere there are geographic hazy opacity areas within the lungs. The possibility of pre-existing pneumonia, bacterial or viral, should be considered. Endotracheal tube appears to be just within the proximal left mainstem bronchus. Review of the MIP images confirms the above findings CTA HEAD FINDINGS Anterior circulation: Right internal  carotid artery is widely patent through the skull base. Carotid siphon shows atherosclerotic calcification but does not show stenosis greater than 30%. 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 There is no antegrade flow in the proximal siphon. There is reconstitution of the supraclinoid common carotid artery with contributions from external to internal collaterals, patent anterior communicating and patent posterior communicating arteries. Both anterior and middle cerebral arteries show flow. I do not see any large or medium vessel occlusion. Posterior circulation: Both vertebral arteries are patent to the basilar. There is atherosclerotic irregularity of the V4 segments but no stenosis. No basilar stenosis. Superior cerebellar and posterior cerebral arteries show flow. Focal stenosis in the right P1 segment of 50%. Venous sinuses: Patent and normal. Anatomic variants: None significant. Review of the MIP images confirms the above findings CT Brain Perfusion Findings: ASPECTS: 9 CBF (<30%) Volume: 21mL Perfusion (Tmax>6.0s) volume: 83mL Mismatch Volume: 63mL Infarction Location:Abnormal T-max within the left posterior parietal brain and also within the subcortical white matter front to back. Infarct Core: Core infarct not shown by see BF. Based on low density in the left parieto-occipital brain that is probably a recent infarction, I suspect that we are in a reperfusion window. Although the study suggests a penumbra of 31 cc, the posterior majority of that is probably an actual infarction. 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 Flow in the major left intracranial vessels is demonstrated, consistent with external to internal collateral, anterior communicating and posterior communicating contributions. There is low-density in the left parieto-occipital brain probably representing acute to subacute infarction. No correctable large or medium vessel occlusion  is identified presently. Watershed pattern affects the left hemisphere with insult to the deep white matter and the left parieto-occipital cortex. Endotracheal tube in left mainstem bronchus. Dependent pulmonary infiltrate and hazy opacity elsewhere. Findings could be due to aspiration or community acquired bacterial or viral pneumonia. These  results were called by telephone at the time of interpretation on 01/15/2019 at 2:58 pm to Dr. Roland Rack , who verbally acknowledged these results. Electronically Signed   By: Nelson Chimes M.D.   On: 01/01/2019 15:15   Dg Chest Port 1 View  Result Date: 01/01/2019 CLINICAL DATA:  Stroke, intubation EXAM: PORTABLE CHEST 1 VIEW COMPARISON:  Portable exam 0508 hours compared 01/20/2019 FINDINGS: Tip of endotracheal tube is at carina directed into RIGHT mainstem bronchus, recommend withdrawal 2.5 cm. Nasogastric tube extends into stomach. Enlargement of cardiac silhouette. Mediastinal contours and pulmonary vascularity normal. Atelectasis LEFT lower lobe. Remaining lungs clear. No pleural effusion or pneumothorax. IMPRESSION: Recommend withdrawal of endotracheal tube 2.5 cm. Enlargement of cardiac silhouette with LEFT lower lobe atelectasis. Findings called to [place] on [facility] ICU on 01/01/2019 at 0810 hours. Electronically Signed   By: Lavonia Dana M.D.   On: 01/01/2019 08:10   Dg Chest Portable 1 View  Result Date: 01/01/2019 CLINICAL DATA:  ETT placement EXAM: PORTABLE CHEST 1 VIEW COMPARISON:  July 17, 2018 FINDINGS: ET tube is 1 cm above the level the carina. NG tube is seen coursing below the level of the diaphragm. There is mildly increased interstitial markings seen throughout both lungs with right perihilar/mid lung patchy airspace opacity. Cardiomegaly is seen. No acute osseous abnormality. IMPRESSION: 1. ET tube tip 1 cm above the level of carina. 2. NG tube below the diaphragm 3. nonspecific patchy airspace opacity in the right mid lung could be due to  atelectasis and/or early infectious etiology. Electronically Signed   By: Prudencio Pair M.D.   On: 01/08/2019 11:08    Labs:  CBC: Recent Labs    07/17/18 0943 01/26/2019 1054 01/25/2019 1214 12/30/2018 1832 01/01/19 0415 01/01/19 0445  WBC 7.5 8.5  --  8.6  --  12.9*  HGB 12.9 13.1 13.3 9.4* 10.5* 10.0*  HCT 40.0 43.9 39.0 30.6* 31.0* 32.3*  PLT 269.0 395  --  285  --  292    COAGS: No results for input(s): INR, APTT in the last 8760 hours.  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 Estanislado Pandy. Patient's condition stable- remains intubated/sedated, can spontaneously move left side and RLE but no spontaneous movements of RUE. Right groin incision stable- distal pulses palpable bilaterally with Doppler. Plan for possible extubation today. Further plans per neurology/CCM- appreciate and agree with management. NIR to follow.   Electronically Signed: Earley Abide, PA-C 01/01/2019, 9:55 AM   I spent a total of 25 Minutes at the the patient's bedside AND on the patient's  hospital floor or unit, greater than 50% of which was counseling/coordinating care for left ICA, left MCA, and left ACA occlusions s/p revascularization.

## 2019-01-01 NOTE — Progress Notes (Signed)
STROKE TEAM PROGRESS NOTE   INTERVAL HISTORY I have personally reviewed history of presenting illness, electronic medical records and imaging films in PACS.  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  She remains intubated because of respiratory failure due to developing pulmonary edema and chest x-ray showing pulmonary infiltrate and patient being obese and having difficult airway.  Blood pressure is adequately controlled and she is on Cleviprex drip.  She is on propofol drip for sedation and is moving left side purposefully but not the right though she has some withdrawal in the right leg  Vitals:   01/01/19 0930 01/01/19 0945 01/01/19 1000 01/01/19 1015  BP: 120/62 135/64 140/74 133/62  Pulse: 86 90 93 91  Resp: 19 (!) 23 (!) 23 (!) 23  Temp:      TempSrc:      SpO2: 100% 100% 100% 100%  Weight:      Height:        CBC:  Recent Labs  Lab 12/30/2018 1054  01/03/2019 1832 01/01/19 0415 01/01/19 0445  WBC 8.5  --  8.6  --  12.9*  NEUTROABS 5.0  --   --   --  10.5*  HGB 13.1   < > 9.4* 10.5* 10.0*  HCT 43.9   < > 30.6* 31.0* 32.3*  MCV 81.0  --  79.9*  --  80.5  PLT 395  --  285  --  292   < > = values in this interval not displayed.    Basic Metabolic Panel:  Recent Labs  Lab 01/23/2019 1054  01/15/2019 1832 01/01/19 0415 01/01/19 0445  NA 140   < >  --  140 138  K 4.2   < >  --  3.5 3.4*  CL 103  --   --   --  106  CO2 25  --   --   --  20*  GLUCOSE 209*  --   --   --  144*  BUN 19  --   --   --  17  CREATININE 1.29*  --   --   --  1.35*  CALCIUM 9.6  --   --   --  8.5*  MG  --   --  1.9  --   --   PHOS  --   --  4.3  --   --    < > = values in this interval not displayed.   Lipid Panel:     Component Value Date/Time   CHOL 146 01/01/2019 0823   TRIG 195 (H) 01/01/2019 0823   HDL 30 (L) 01/01/2019 0823   CHOLHDL 4.9  01/01/2019 0823   VLDL 39 01/01/2019 0823   LDLCALC 77 01/01/2019 0823   HgbA1c:  Lab Results  Component Value Date   HGBA1C 5.7 (H) 01/26/2019   Urine Drug Screen:     Component Value Date/Time   LABOPIA NONE DETECTED 01/21/2019 2036   COCAINSCRNUR NONE DETECTED 01/08/2019 2036   LABBENZ NONE DETECTED 01/05/2019 2036   AMPHETMU NONE DETECTED 01/24/2019 2036   THCU NONE DETECTED 01/19/2019 2036   LABBARB NONE DETECTED 01/19/2019 2036    Alcohol Level No results found for: ETH  IMAGING Ct Head Wo Contrast 01/15/2019 1. No acute intracranial abnormality. 2. 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   Ct Angio Head W Or Wo  Contrast Ct Angio Neck W Or Wo Contrast Ct Cerebral Perfusion W Contrast 01/15/2019 Occlusion of the left internal carotid artery in the bulb. Flow in the major left intracranial vessels is demonstrated, consistent with external to internal collateral, anterior communicating and posterior communicating contributions. There is low-density in the left parieto-occipital brain probably representing acute to subacute infarction. No correctable large or medium vessel occlusion is identified presently. Watershed pattern affects the left hemisphere with insult to the deep white matter and the left parieto-occipital cortex. Endotracheal tube in left mainstem bronchus. Dependent pulmonary infiltrate and hazy opacity elsewhere. Findings could be due to aspiration or community acquired bacterial or viral pneumonia.   Cerebral angiogram 12/30/2018 S/P bilateral common carotid arteriogram followed by complete revascularization of occluded Lt ICA, intracranially and extracranially , and Lt MCA and Lt ACA with x 2 passes with 88mm x 67mm embotrap and x 1 pass with 46mmx 40 mm solitaire FR and x 1 pass with solitaire 35mm x 40 mm X device achieving a TICI 3 revascularization.  Mr Brain Wo Contrast 01/01/2019 1. Multifocal acute ischemia,  predominantly within the left hemisphere, with the largest area of infarct located in the left anterior cerebral artery territory. 2. 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 3. 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   Dg Chest Port 1 View 01/01/2019 Recommend withdrawal of endotracheal tube 2.5 cm. Enlargement of cardiac silhouette with LEFT lower lobe atelectasis.  01/27/2019 1. ET tube tip 1 cm above the level of carina. 2. NG tube below the diaphragm 3. nonspecific patchy airspace opacity in the right mid lung could be due to atelectasis and/or early infectious etiology.    PHYSICAL EXAM Obese middle-aged African-American lady who is sedated and intubated. . Afebrile. Head is nontraumatic. Neck is supple without bruit.    Cardiac exam no murmur or gallop. Lungs are clear to auscultation. Distal pulses are well felt. Neurological Exam :  Patient is sedated and intubated.  Eyes are closed.  She opens eyes partially to sternal rub.  Eyes in primary position.  Doll's eye movements are present.  Pupils irregular but reactive.  Fundi not visualized.  She has some spontaneous left lower and upper extremity movements and will withdraw briskly on the left side purposefully to painful stimuli.  No spontaneous movements on the right.  She does not withdraw in the right upper extremity to pain but does withdraw partially in the right lower extremity to pain.  Reflexes are depressed on the right compared to the left.  Right plantar upgoing left downgoing.  ASSESSMENT/PLAN Ms. RAIA AMICO is a 63 y.o. female with history of hypertension, heart murmur, asthma ID presenting following a fall to the right.  Found to be hypertensive with progressive shortness of breath.  Exam found severe right hemapheresis. Not a tPA candidate. Taken to IR.  Stroke:  left ACA infarct s/p IR w/ TICI3 revascularization of occluded L PROXIMAL ICA, L MCA and L ACA - embolic  secondary to unknown source  CT head No acute abnormality. Small vessel disease. Old R cerebellar infarct.   CTA head & neck, CT perfusion -  occlusion L ICA at bulb.  Low density L parietal-occipital lobe with watershed pattern.  ET tube L mainstem bronchus.  Dependent pulmonary infiltrate and hazy opacity otherwise.  Cerebral angiogram occluded L ICA intra and extra cranially - revascularized L ICA, L MCA and L ASA w/ embotrap, solitaire->TICI3  Post IR CT no ICH. ASPECTS 10  MRI multifocal L brain ischemia, mainly in the [place] territory.  Chronic small vessel disease.  Multiple chronic hypertensive micro bleeds.  R cerebellar and L occipital infarcts.  2D Echo pending  EEG pending  LDL 77  HgbA1c 5.7  HIV negative  SARS coronavirus 2 neg  Heparin 5000 units sq tid for VTE prophylaxis  No antithrombotic prior to admission, now on No antithrombotic. Add aspirin 81 mg daily (300 supp if no tube)  Therapy recommendations:  pending   Disposition:  pending   Acute hypercarbic and hypoxemic respiratory failure in setting of right hemapheresis and hypertensive emergency   History of intermittent asthma   intubated in ED  BNP 2574.5  CCM on board   Hypertensive emergency  Hypertension  BP on arrival 269/193  Home meds:  No listed BP meds on MAR  Currently on Cleviprex  BP mgmt per IR x 24h . Long-term BP goal normotensive  Hyperlipidemia  Home meds:  lipitor 20, resumed in hospital  LDL 77, goal < 70  Continue statin at discharge  Dysphagia . Secondary to stroke . NPO . Speech will follow up post extubation   Other Stroke Risk Factors  Morbid obesity, Body mass index is 42.37 kg/m., recommend weight loss, diet and exercise as appropriate   Other Active Problems  Asthma  Leukocytosis WBC 8.6->12.9  Acute blood loss anemia post IR 13.1->10.0  Hypokalemia 3.4  Renal insufficiency Cr 1.39  Hospital day # 1  I have personally obtained  history,examined this patient, reviewed notes, independently viewed imaging studies, participated in medical decision making and plan of care.ROS completed by me personally and pertinent positives fully documented  I have made any additions or clarifications directly to the above note.  Patient presented with large left hemispheric infarct symptoms due to occluded proximal left ICA embolization to left MCA and ACA and underwent successful mechanical thrombectomy with complete revascularization.  MRI does show multifocal infarcts in the left hemisphere.  She does have respiratory failure secondary to pulmonary infiltrates as well as her obesity and suspected cardiac disease.  Discussed with Dr. Ruthann Cancer critical care medicine will await echocardiogram results prior to attempting to wean off ventilatory support and extubate.  Continue strict blood pressure control and close neurological monitoring as per post interventional protocol.  Keep systolic blood pressure [phone] for 24 hours continue Cleviprex drip.  Check EEG for seizure activity.  No family available at bedside for discussion.  Continue aspirin for stroke prevention.  Discussed with Dr. Ruthann Cancer critical care medicine This patient is critically ill and at significant risk of neurological worsening, death and care requires constant monitoring of vital signs, hemodynamics,respiratory and cardiac monitoring, extensive review of multiple databases, frequent neurological assessment, discussion with family, other specialists and medical decision making of high complexity.I have made any additions or clarifications directly to the above note.This critical care time does not reflect procedure time, or teaching time or supervisory time of PA/NP/Med Resident etc but could involve care discussion time.  I spent 35 minutes of neurocritical care time  in the care of  this patient.     Antony Contras, MD Medical Director [place] Pager:  [phone] 01/01/2019 11:28 AM   To contact Stroke Continuity provider, please refer to [email or web address] After hours, contact General Neurology

## 2019-01-01 NOTE — Procedures (Addendum)
Patient Name: ABIE KILLIAN  MRN: [id number]  Epilepsy Attending: Lora Havens  Referring Physician/Provider: Dr Antony Contras Date: 01/01/19 Duration: 26.35 mins  Patient history: 62yo F with left sides stroke. EEG ordered to evaluate for seizures.   Patient's state: sedated/comatose  Technical aspects: This EEG study was done with scalp electrodes positioned according to the 10-20 International system of electrode placement. Electrical activity was acquired at a sampling rate of 500Hz  and reviewed with a high frequency filter of 70Hz  and a low frequency filter of 1Hz . EEG data were recorded continuously and digitally stored.   DESCRIPTION: Continuous generalized 3-6hz  theta-delta slowing admixed with 8-10hz  alpha frequencies, maximal in left hemisphere was noted. EEG was reactive to tactile stimulation. Hyperventilation and photic stimulation were not performed.  IMPRESSION: This study is suggestive of severe diffuse encephalopathy. No seizures or epileptiform discharges were seen throughout the recording.   Arnett Galindez Barbra Sarks

## 2019-01-01 NOTE — Progress Notes (Signed)
Patient transported from 4N18 to MRI and back with no complications.

## 2019-01-01 NOTE — Progress Notes (Signed)
Bedside EEG completed, results pending. 

## 2019-01-02 ENCOUNTER — Inpatient Hospital Stay (HOSPITAL_COMMUNITY): Payer: Medicaid Other

## 2019-01-02 ENCOUNTER — Encounter (HOSPITAL_COMMUNITY): Payer: Self-pay | Admitting: Interventional Radiology

## 2019-01-02 DIAGNOSIS — I639 Cerebral infarction, unspecified: Secondary | ICD-10-CM

## 2019-01-02 DIAGNOSIS — J9600 Acute respiratory failure, unspecified whether with hypoxia or hypercapnia: Secondary | ICD-10-CM

## 2019-01-02 LAB — BASIC METABOLIC PANEL
Anion gap: 10 (ref 5–15)
BUN: 19 mg/dL (ref 8–23)
CO2: 20 mmol/L — ABNORMAL LOW (ref 22–32)
Calcium: 8.6 mg/dL — ABNORMAL LOW (ref 8.9–10.3)
Chloride: 106 mmol/L (ref 98–111)
Creatinine, Ser: 1.51 mg/dL — ABNORMAL HIGH (ref 0.44–1.00)
GFR calc Af Amer: 42 mL/min — ABNORMAL LOW (ref >60)
GFR calc non Af Amer: 36 mL/min — ABNORMAL LOW (ref >60)
Glucose, Bld: 108 mg/dL — ABNORMAL HIGH (ref 70–99)
Potassium: 3.4 mmol/L — ABNORMAL LOW (ref 3.5–5.1)
Sodium: 136 mmol/L (ref 135–145)

## 2019-01-02 LAB — GLUCOSE, CAPILLARY
Glucose-Capillary: 93 mg/dL (ref 70–99)
Glucose-Capillary: 95 mg/dL (ref 70–99)
Glucose-Capillary: 97 mg/dL (ref 70–99)
Glucose-Capillary: 116 mg/dL — ABNORMAL HIGH (ref 70–99)
Glucose-Capillary: 100 mg/dL — ABNORMAL HIGH (ref 70–99)
Glucose-Capillary: 119 mg/dL — ABNORMAL HIGH (ref 70–99)

## 2019-01-02 LAB — POCT I-STAT 7, (LYTES, BLD GAS, ICA,H+H)
Acid-base deficit: 3 mmol/L — ABNORMAL HIGH (ref 0.0–2.0)
Bicarbonate: 22.2 mmol/L (ref 20.0–28.0)
Calcium, Ion: 1.27 mmol/L (ref 1.15–1.40)
HCT: 28 % — ABNORMAL LOW (ref 36.0–46.0)
Hemoglobin: 9.5 g/dL — ABNORMAL LOW (ref 12.0–15.0)
O2 Saturation: 94 %
Potassium: 3.4 mmol/L — ABNORMAL LOW (ref 3.5–5.1)
Sodium: 140 mmol/L (ref 135–145)
TCO2: 23 mmol/L (ref 22–32)
pCO2 arterial: 41.2 mmHg (ref 32.0–48.0)
pH, Arterial: 7.339 — ABNORMAL LOW (ref 7.350–7.450)
pO2, Arterial: 78 mmHg — ABNORMAL LOW (ref 83.0–108.0)

## 2019-01-02 LAB — DRUG PROFILE, UR, 9 DRUGS (LABCORP)
Amphetamines, Urine: NEGATIVE ng/mL
Barbiturate, Ur: NEGATIVE ng/mL
Benzodiazepine Quant, Ur: NEGATIVE ng/mL
Cannabinoid Quant, Ur: NEGATIVE ng/mL
Cocaine (Metab.): NEGATIVE ng/mL
Methadone Screen, Urine: NEGATIVE ng/mL
Opiate Quant, Ur: NEGATIVE ng/mL
Phencyclidine, Ur: NEGATIVE ng/mL
Propoxyphene, Urine: NEGATIVE ng/mL

## 2019-01-02 LAB — CBC
HCT: 30.3 % — ABNORMAL LOW (ref 36.0–46.0)
Hemoglobin: 9.2 g/dL — ABNORMAL LOW (ref 12.0–15.0)
MCH: 24.3 pg — ABNORMAL LOW (ref 26.0–34.0)
MCHC: 30.4 g/dL (ref 30.0–36.0)
MCV: 79.9 fL — ABNORMAL LOW (ref 80.0–100.0)
Platelets: 282 10*3/uL (ref 150–400)
RBC: 3.79 MIL/uL — ABNORMAL LOW (ref 3.87–5.11)
RDW: 17.6 % — ABNORMAL HIGH (ref 11.5–15.5)
WBC: 10 10*3/uL (ref 4.0–10.5)
nRBC: 0 % (ref 0.0–0.2)

## 2019-01-02 LAB — PHOSPHORUS: Phosphorus: 3.7 mg/dL (ref 2.5–4.6)

## 2019-01-02 LAB — TRIGLYCERIDES: Triglycerides: 115 mg/dL (ref <150)

## 2019-01-02 LAB — MAGNESIUM: Magnesium: 2.2 mg/dL (ref 1.7–2.4)

## 2019-01-02 MED ORDER — HYDRALAZINE HCL 20 MG/ML IJ SOLN
20.0000 mg | INTRAMUSCULAR | Status: DC | PRN
  Administered 2019-01-02 – 2019-01-09 (×15): 20 mg via INTRAVENOUS
  Filled 2019-01-02 (×15): qty 1

## 2019-01-02 MED ORDER — FENTANYL CITRATE (PF) 100 MCG/2ML IJ SOLN
25.0000 ug | INTRAMUSCULAR | Status: DC | PRN
  Administered 2019-01-03 (×2): 50 ug via INTRAVENOUS
  Administered 2019-01-04: 100 ug via INTRAVENOUS
  Administered 2019-01-04: 50 ug via INTRAVENOUS
  Administered 2019-01-05 – 2019-01-09 (×8): 100 ug via INTRAVENOUS
  Filled 2019-01-02 (×12): qty 2

## 2019-01-02 MED ORDER — DEXMEDETOMIDINE HCL IN NACL 200 MCG/50ML IV SOLN
0.4000 ug/kg/h | INTRAVENOUS | Status: DC
  Administered 2019-01-02 (×3): 0.4 ug/kg/h via INTRAVENOUS
  Administered 2019-01-02: 22:00:00 0.6 ug/kg/h via INTRAVENOUS
  Administered 2019-01-03 (×2): 0.4 ug/kg/h via INTRAVENOUS
  Filled 2019-01-02 (×2): qty 50
  Filled 2019-01-02: qty 150
  Filled 2019-01-02 (×2): qty 50

## 2019-01-02 MED ORDER — MIDAZOLAM HCL 2 MG/2ML IJ SOLN
1.0000 mg | INTRAMUSCULAR | Status: DC | PRN
  Administered 2019-01-02 – 2019-01-09 (×6): 2 mg via INTRAVENOUS
  Filled 2019-01-02 (×6): qty 2

## 2019-01-02 MED ORDER — SODIUM CHLORIDE 0.9 % IV SOLN: INTRAVENOUS | Status: DC

## 2019-01-02 MED ORDER — VITAL HIGH PROTEIN PO LIQD
1000.0000 mL | ORAL | Status: DC
  Administered 2019-01-02 – 2019-01-09 (×10): 1000 mL

## 2019-01-02 MED ORDER — PRO-STAT SUGAR FREE PO LIQD
30.0000 mL | Freq: Three times a day (TID) | ORAL | Status: DC
  Administered 2019-01-02 – 2019-01-09 (×21): 30 mL
  Filled 2019-01-02 (×19): qty 30

## 2019-01-02 MED ORDER — HYDRALAZINE HCL 20 MG/ML IJ SOLN: 20.0000 mg | INTRAMUSCULAR | Status: DC | PRN

## 2019-01-02 NOTE — Progress Notes (Signed)
INDICATIONS: cryptogenic stroke  PROCEDURE:   Informed consent was obtained prior to the procedure. The risks, benefits and alternatives for the procedure were discussed and the patient comprehended these risks.  Risks include, but are not limited to, cough, sore throat, vomiting, nausea, somnolence, esophageal and stomach trauma or perforation, bleeding, low blood pressure, aspiration, pneumonia, infection, trauma to the teeth and death.    After a procedural time-out, the oropharynx was anesthetized with 20% benzocaine spray.   During this procedure the patient was administered IV propofol as a continuous infusion mg to achieve and maintain moderate conscious sedation.  The patient's heart rate, blood pressure, and oxygen saturationweare monitored continuously during the procedure. The period of conscious sedation was 16 minutes, of which I was present face-to-face 100% of this time.  The transesophageal probe was inserted in the esophagus and stomach without difficulty and multiple views were obtained.  The patient was kept under observation until the patient left the procedure room.  The patient left the procedure room in stable condition.   Agitated microbubble saline contrast was administered.  COMPLICATIONS:    There were no immediate complications.  FINDINGS:  Normal size and systolic function of the left ventricle, with severe left ventricular hypertrophy. The left atrium is dilated, but there is no thrombus and no spontaneous echo contrast seen. Left atrial appendage velocities are normal. No significant valve abnormalities. Normal aorta. No evidence of intracardiac shunt by saline contrast study.  RECOMMENDATIONS:    Structural changes suggest increased risk for atrial arrhythmia. Recommend extended monitoring for atrial fibrillation, strongly consider implantable loop recorder if atrial fibrillation is not recorded during the current admission.  Time Spent Directly with the  Patient:  40 minutes   Katlin Bortner 01/02/2019, 10:48 AM

## 2019-01-02 NOTE — Progress Notes (Signed)
Initial Nutrition Assessment  DOCUMENTATION CODES:   Morbid obesity  INTERVENTION:   Initiate Vital High Protein @ 50 ml/hr via OG tube 30 ml Prostat TID  Provides: 1500 kcal, 150 grams protein, 1003 ml free water.    NUTRITION DIAGNOSIS:   Inadequate oral intake related to inability to eat as evidenced by NPO status.  GOAL:   Provide needs based on ASPEN/SCCM guidelines  MONITOR:   TF tolerance, Vent status  REASON FOR ASSESSMENT:   Consult, Ventilator Enteral/tube feeding initiation and management  ASSESSMENT:   Pt with PMH of asthma, obesity, HTN, who was admitted with L ACA infarct s/p IR revascularization.   Per MD severe right hemiparesis. Did not tolerate weaning from vent yesterday. Mental status precludes extubation.  Spoke with MD, ok to start TF  Patient is currently intubated on ventilator support Temp (24hrs), Avg:98.3 F (36.8 C), Min:97.8 F (36.6 C), Max:99.2 F (37.3 C)  Medications reviewed cleviprex and propofol off Labs reviewed    NUTRITION - FOCUSED PHYSICAL EXAM:  Deferred   Diet Order:   Diet Order            Diet NPO time specified  Diet effective now              EDUCATION NEEDS:   No education needs have been identified at this time  Skin:  Skin Assessment: Reviewed RN Assessment  Last BM:  unknown  Height:   Ht Readings from Last 1 Encounters:  01/22/2019 5\' 5"  (1.651 m)    Weight:   Wt Readings from Last 1 Encounters:  01/02/19 116.1 kg    Ideal Body Weight:  56.8 kg  BMI:  Body mass index is 42.59 kg/m.  Estimated Nutritional Needs:   Kcal:  2992-4268  Protein:  142 grams  Fluid:  >1.5L/day  [place], [place], [place] Pager [phone] After Hours Pager

## 2019-01-02 NOTE — Progress Notes (Signed)
    [facility] has been requested to perform a transesophageal echocardiogram on L[place] for stroke.  After careful review of history and examination, the risks and benefits of transesophageal echocardiogram have been explained to her daughter, Fulton Mole, including risks of esophageal damage, perforation (1:10,000 risk), bleeding, pharyngeal hematoma as well as other potential complications associated with conscious sedation including aspiration, arrhythmia, respiratory failure and death. Alternatives to treatment were discussed, questions were answered. Patient is willing to proceed.   The procedure will be done today at the bedside as she is intubated, by Dr. Sallyanne Kuster.   Daune Perch, NP  01/02/2019 10:02 AM

## 2019-01-02 NOTE — Progress Notes (Signed)
NAME:  Alejandra Vega, MRN:  [id number], DOB:  02/02/1956, LOS: 2 ADMISSION DATE:  12/29/2018, CONSULTATION DATE:  01/01/2019 REFERRING MD:  Dr. Leonie Man CHIEF COMPLAINT: Respiratory failure  Brief History   Patient is 63 year old woman w/ PMH of asthma, obesity HTN who was admitted for AMS, SOB, and right-sided weakness and found to have hypertensive urgency and occluded L ICA, intracranially and extracranially, L MCA and L ACA. S/p bilateral common carotid arteriogram followed by complete TICI 3 revascularization, now on vent and weaning.  PCCM consulted for vent management  History of present illness   Patient presents to the ED on 8/4 initially with confusion, fall, and reported R-sided weakness. Noted to have SOB on arrival with EMS. Unknown last normal time or timeframe for symptoms., but out of window for tPA. Patient acutely worsening in the ED with significant increased SOB and HTN. Patient was intubated and taken emergently for CT head to r/o bleed which was negative. Postintubation blood pressure 269/193. CTA head/neck shows acute carotid occlusion and patient taken emergently for revascularization. Thrombectomy of the occluded L ICA achieved complete TICI 3 revascularization. Neuro and PCCM consulted.   Past Medical History  Moderate intermittent asthma w/ 20% improvement in FEV1 bronchodilator response, obesity, HTN, gastric ulcers, anxiety, and anemia.  Significant Hospital Events   8/4 <<< Intubated in ED 8/4 <<< thrombectomy of occluded L ICA w/ complete TICI 3 revascularization  Consults:  Neuro 8/4 PCCM 8/4  Procedures:  Thrombectomy 8/4  Significant Diagnostic Tests:  CXR 8/4 << ET tube tip 1 cm above the level of carina. NG tube below the diaphragm. Nonspecific patchy airspace opacity in the right mid lung likely atelectasis and/or early infectious etiology CT head wo contrast 8/4 << No acute intracranial abnormality. Mild chronic microvascular ischemic changes  including prior right cerebellar infarct. CTA Neck 8/4 << Aortic atherosclerosis. No aneurysm or dissection. Occluded L ICA. Patent R ICA and vertebral arteries CTA Head 8/4 << Focal stenosis in the right P1 segment of 50%. Acute/subacute infarction left parieto-occipital brain  Brain MRI 8/5 << Multifocal acute ischemia, mostly L hemisphere, largest infarct in L ACA territory, chronic ischemic microangiopathy and multiple chronic hypertensive micro bleeds. Old R cerebellar and L occipital lobe infarcts. CXR 8/5 << L lower lobe atelectasis. No pleural effusion or pneumothorax. ECHO 8/5 <<< EF 55-60%, elevated LVEDP and left atrial filling pressure  Micro Data:  SARS Coronavirus 2 8/4 <<< Negative MRSA PCR 8/4 <<< Negative Respiratory culture 8/4 <<< No growth in 24 hr Gram stain 8/4 <<< Mod WBC, mostly PMN, rare gram + cocci in pairs Blood culture 8/4 <<< No growth in < 24 hr  Antimicrobials:    Interim history/subjective:  [name] was unable to wean yesterday. She started belly breathing after a few hours of wean off vent so she was put back on full vent support. She had no acute events overnight. This AM, she is still sleepy, eyes closed and not following commands. She is currently weaning off vent with sedation down to propofol 5 mcg/kg/min. Plan to extubate today if able to wean.  Objective   Blood pressure (!) 144/86, pulse 80, temperature 98.1 F (36.7 C), temperature source Axillary, resp. rate 13, height 5\' 5"  (1.651 m), weight 116.1 kg, SpO2 100 %.    Vent Mode: CPAP;PSV FiO2 (%):  [30 %-40 %] 40 % Set Rate:  [24 bmp] 24 bmp Vt Set:  [390 mL] 390 mL PEEP:  [5 cmH20] 5 cmH20  Pressure Support:  [5 cmH20-10 cmH20] 10 cmH20 Plateau Pressure:  [17 cmH20-22 cmH20] 19 cmH20   Intake/Output Summary (Last 24 hours) at 01/02/2019 0859 Last data filed at 01/02/2019 0800 Gross per 24 hour  Intake 2899.81 ml  Output 1325 ml  Net 1574.81 ml   Filed Weights   01/20/2019 1830  01/01/19 0448 01/02/19 0443  Weight: 113 kg 115.5 kg 116.1 kg    Examination: General: Still intubated with sedation. NAD, not following commands HEENT: [place]/AT. Neck supple Lungs: On vent PS/CPAP. Coarse lung sounds. Mild increased WOB w/ belly breathing. Cardiovascular: RRR no m/r/g Abdomen: Soft, mildly distended Skin: R groin incision soft w/o active bleeding or hematoma. Extremities: Distal pulses palpable bilaterally with Doppler Neuro: On vent and sedation. Eyes closed w/ bilateral pupil unreactive to light. Can spontaneously move left side and RLE but no spontaneous movements of RUE. Does not follow commands  Resolved Hospital Problem list     Assessment & Plan:  Acute Respiratory failure. - Hx of intermittent asthma on symbicort. Likely multifactorial with element of worsening respiratory disease and volume overloaded state - CXR 8/5 w/ L lower lobe atelectasis, pending CXR today - pH 7.339, pCO2 41.2 w/ improved pO2 78 <-- 59 - On PS/CPAP with pressure support of 8 mmHg, RR 20-25, FiO2 40, PEEP 5 - On propofol 5 mcg/kg/min, weaning off sedation - Plan to extubate as tolerated today - Continue scheduled budesonide, Brovana and DuoNeb - Neg sputum and blood culture so far, no indication for abx today - BNP 2574, elevated D-dimer, normal lactate - ECHO 8/5 w/ EF 55-60%, elevated LVEDP and left atrial filling pressure - Consider diuresis s/p extubation in the setting elevated LAP, LVDEP and respiratory distress - F/u TEE per stroke team - F/u outpatient PFT  Left Hemispheric Stroke.  - Left ACA infarct s/p IR w/ TICI3 revascularization of occluded L PROXIMAL ICA, L MCA and L ACA - Severe right hemapheresis on exam - Likely 2/2 to hypertensive emergency vs. thromboemboli - EEG suggestive of severe diffuse encephalopathy. No seizures or epileptiform discharges. - LDL 77, A1C 5.7 - Continue heparin 5000 units sq tid for VTE prophylaxis - Stroke team following, appreciate recs  - Plan for TEE today  Hypertensive emergency, Hx of HTN. - BP elevated to 269/193 on arrival, currently stable w/ BP regimen - Aggressive BP control w/ goal of sBP < 160 - Continue Cleviprex 12 mg/hr and titrate to meet BP goal - PRN Hydralazine - CT angio chest s/p extubation  Hyperglycemia. - Improved, last CBG 95 - SSI  AKI.  - Cr of 1.35 <-- 1.29 - Unclear baseline - Avoid nephrotoxic agents - F/u BMP  Anemia. - Hgb down to 9.5 (Hct 28) from 13.3 on arrival - S/p ICA thrombectomy - R groin incision soft w/o active bleeding or hematoma. - Hx of anemia  - IR following - F/u CBC  Best practice:  Diet: Tube feeds Pain/Anxiety/Delirium protocol (if indicated): Propofol VAP protocol (if indicated): N/A DVT prophylaxis: SCDs GI prophylaxis: PPI Glucose control: SSI Mobility: Bedrest Code Status: Full Family Communication: discussion with daughter in room Disposition: ICU   Labs   CBC: Recent Labs  Lab 01/20/2019 1054 01/08/2019 1214 01/02/2019 1832 01/01/19 0415 01/01/19 0445 01/02/19 0827  WBC 8.5  --  8.6  --  12.9*  --   NEUTROABS 5.0  --   --   --  10.5*  --   HGB 13.1 13.3 9.4* 10.5* 10.0* 9.5*  HCT 43.9 39.0  30.6* 31.0* 32.3* 28.0*  MCV 81.0  --  79.9*  --  80.5  --   PLT 395  --  285  --  292  --     Basic Metabolic Panel: Recent Labs  Lab 12/30/2018 1054 01/15/2019 1214 01/10/2019 1832 01/01/19 0415 01/01/19 0445 01/02/19 0827  NA 140 139  --  140 138 140  K 4.2 3.9  --  3.5 3.4* 3.4*  CL 103  --   --   --  106  --   CO2 25  --   --   --  20*  --   GLUCOSE 209*  --   --   --  144*  --   BUN 19  --   --   --  17  --   CREATININE 1.29*  --   --   --  1.35*  --   CALCIUM 9.6  --   --   --  8.5*  --   MG  --   --  1.9  --   --   --   PHOS  --   --  4.3  --   --   --    GFR: Estimated Creatinine Clearance: 54.3 mL/min (A) (by C-G formula based on SCr of 1.35 mg/dL (H)). Recent Labs  Lab 12/28/2018 1054 01/19/2019 1832 01/01/19 0445  WBC 8.5 8.6  12.9*  LATICACIDVEN  --  0.9  --     Liver Function Tests: Recent Labs  Lab 01/27/2019 1054  AST 35  ALT 53*  ALKPHOS 99  BILITOT 1.1  PROT 7.9  ALBUMIN 4.1   No results for input(s): LIPASE, AMYLASE in the last 168 hours. No results for input(s): AMMONIA in the last 168 hours.  ABG    Component Value Date/Time   PHART 7.339 (L) 01/02/2019 0827   PCO2ART 41.2 01/02/2019 0827   PO2ART 78.0 (L) 01/02/2019 0827   HCO3 22.2 01/02/2019 0827   TCO2 23 01/02/2019 0827   ACIDBASEDEF 3.0 (H) 01/02/2019 0827   O2SAT 94.0 01/02/2019 0827     Coagulation Profile: No results for input(s): INR, PROTIME in the last 168 hours.  Cardiac Enzymes: No results for input(s): CKTOTAL, CKMB, CKMBINDEX, TROPONINI in the last 168 hours.  HbA1C: Hgb A1c MFr Bld  Date/Time Value Ref Range Status  01/10/2019 06:32 PM 5.7 (H) 4.8 - 5.6 % Final    Comment:    REPEATED TO VERIFY (NOTE) Pre diabetes:          5.7%-6.4% Diabetes:              >6.4% Glycemic control for   <7.0% adults with diabetes     CBG: Recent Labs  Lab 01/01/19 1530 01/01/19 1916 01/01/19 2304 01/02/19 0312 01/02/19 0735  GLUCAP 116* 97 96 93 95    Linwood Dibbles, MS4

## 2019-01-02 NOTE — Progress Notes (Addendum)
STROKE TEAM PROGRESS NOTE   INTERVAL HISTORY Patient stable, still not opening eyes to stim or following commands, weaning, sedation being titrated down, will change BP parameters today up to 180 (was at 120 due to IR), will likely need TEE and possibly loop.   Vitals:   01/02/19 0630 01/02/19 0645 01/02/19 0744 01/02/19 0800  BP: 127/77   (!) 144/86  Pulse: 71 69  80  Resp: (!) 24 (!) 24  13  Temp:    98.1 F (36.7 C)  TempSrc:    Axillary  SpO2: 95% 98% 97% 100%  Weight:      Height:        CBC:  Recent Labs  Lab 01/18/2019 1054  01/22/2019 1832  01/01/19 0445 01/02/19 0827  WBC 8.5  --  8.6  --  12.9*  --   NEUTROABS 5.0  --   --   --  10.5*  --   HGB 13.1   < > 9.4*   < > 10.0* 9.5*  HCT 43.9   < > 30.6*   < > 32.3* 28.0*  MCV 81.0  --  79.9*  --  80.5  --   PLT 395  --  285  --  292  --    < > = values in this interval not displayed.    Basic Metabolic Panel:  Recent Labs  Lab 01/12/2019 1054  01/22/2019 1832  01/01/19 0445 01/02/19 0827  NA 140   < >  --    < > 138 140  K 4.2   < >  --    < > 3.4* 3.4*  CL 103  --   --   --  106  --   CO2 25  --   --   --  20*  --   GLUCOSE 209*  --   --   --  144*  --   BUN 19  --   --   --  17  --   CREATININE 1.29*  --   --   --  1.35*  --   CALCIUM 9.6  --   --   --  8.5*  --   MG  --   --  1.9  --   --   --   PHOS  --   --  4.3  --   --   --    < > = values in this interval not displayed.   Lipid Panel:     Component Value Date/Time   CHOL 146 01/01/2019 0823   TRIG 115 01/02/2019 0438   HDL 30 (L) 01/01/2019 0823   CHOLHDL 4.9 01/01/2019 0823   VLDL 39 01/01/2019 0823   LDLCALC 77 01/01/2019 0823   HgbA1c:  Lab Results  Component Value Date   HGBA1C 5.7 (H) 01/14/2019   Urine Drug Screen:     Component Value Date/Time   LABOPIA NONE DETECTED 01/20/2019 2036   COCAINSCRNUR NONE DETECTED 01/06/2019 2036   LABBENZ NONE DETECTED 01/01/2019 2036   AMPHETMU NONE DETECTED 01/02/2019 2036   THCU NONE DETECTED  01/14/2019 2036   LABBARB NONE DETECTED 01/27/2019 2036    Alcohol Level No results found for: ETH  IMAGING Ct Head Wo Contrast 01/20/2019 1. No acute intracranial abnormality. 2. 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   Ct Angio Head W Or Wo Contrast Ct Angio Neck W Or Wo Contrast Ct Cerebral Perfusion W Contrast 01/23/2019 Occlusion of  the left internal carotid artery in the bulb. Flow in the major left intracranial vessels is demonstrated, consistent with external to internal collateral, anterior communicating and posterior communicating contributions. There is low-density in the left parieto-occipital brain probably representing acute to subacute infarction. No correctable large or medium vessel occlusion is identified presently. Watershed pattern affects the left hemisphere with insult to the deep white matter and the left parieto-occipital cortex. Endotracheal tube in left mainstem bronchus. Dependent pulmonary infiltrate and hazy opacity elsewhere. Findings could be due to aspiration or community acquired bacterial or viral pneumonia.   Cerebral angiogram 01/09/2019 S/P bilateral common carotid arteriogram followed by complete revascularization of occluded Lt ICA, intracranially and extracranially , and Lt MCA and Lt ACA with x 2 passes with 48mm x 18mm embotrap and x 1 pass with 70mmx 40 mm solitaire FR and x 1 pass with solitaire 36mm x 40 mm X device achieving a TICI 3 revascularization.  Mr Brain Wo Contrast 01/01/2019 1. Multifocal acute ischemia, predominantly within the left hemisphere, with the largest area of infarct located in the left anterior cerebral artery territory. 2. 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 3. 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   Dg Chest Port 1 View 01/02/2019 pending  01/01/2019 Recommend withdrawal of endotracheal tube 2.5 cm. Enlargement of cardiac  silhouette with LEFT lower lobe atelectasis.  01/08/2019 1. ET tube tip 1 cm above the level of carina. 2. NG tube below the diaphragm 3. nonspecific patchy airspace opacity in the right mid lung could be due to atelectasis and/or early infectious etiology.   2D Echocardiogram  1. The left ventricle has normal systolic function with an ejection fraction of 60-65%. The cavity size was normal. There is moderate concentric left ventricular hypertrophy. Left ventricular diastolic Doppler parameters are consistent with  pseudonormalization.  2. The right ventricle has normal systolic function. The cavity was normal. There is no increase in right ventricular wall thickness.  3. Left atrial size was mildly dilated.  4. Right atrial size was severely dilated.  5. Aortic valve regurgitation is mild by color flow Doppler. No stenosis of the aortic valve.  6. The aorta is normal in size and structure.  7. The aortic root, ascending aorta and aortic arch are normal in size and structure.  8. There is moderate dilatation of the ascending aorta.  9. The atrial septum is grossly normal.  EEG This study is suggestive of severe diffuse encephalopathy. No seizures or epileptiform discharges were seen throughout the recording.   PHYSICAL EXAM: Obese middle-aged African-American lady who is sedated and intubated. . Afebrile. Head is nontraumatic. Neck is supple without bruit.    Cardiac exam no murmur or gallop. Lungs are clear to auscultation. Distal pulses are well felt. Neurological Exam :  Patient is sedated and intubated.  Eyes are closed.  She does not open eyes to sternal rub.  Eyes in primary position.  Doll's eye movements are present.  Pupils irregular but reactive however sluggish.  Fundi not visualized due to noncooperation.  She has no spontaneous left lower and upper extremity movements but will withdraw slightly on the left side to painful stimuli.  No spontaneous movements on the right.  She does  not withdraw in the right upper extremity to pain but does withdraw partially in the right lower extremity to pain.  Tone increased on the right. Reflexes are increased on the right compared to the left.  Right plantar upgoing left equiv.  ASSESSMENT/PLAN Ms. HANH KERTESZ is a 63 y.o. female with history of hypertension, heart murmur, asthma ID presenting following a fall to the right.  Found to be hypertensive with progressive shortness of breath.  Exam found severe right hemapheresis. Not a tPA candidate. Taken to IR.  Stroke:  Multifocal left brain infarcts s/p IR w/ TICI3 revascularization of occluded L PROXIMAL ICA, L MCA and L ACA - embolic secondary to unknown source  CT head No acute abnormality. Small vessel disease. Old R cerebellar infarct.   CTA head & neck, CT perfusion -  occlusion L ICA at bulb.  Low density L parietal-occipital lobe with watershed pattern.  ET tube L mainstem bronchus.  Dependent pulmonary infiltrate and hazy opacity otherwise.  Cerebral angiogram occluded L ICA intra and extra cranially - revascularized L ICA, L MCA and L ASA w/ embotrap, solitaire->TICI3  Post IR CT no ICH. ASPECTS 10  MRI multifocal L brain ischemia, mainly in the ACA territory.  Chronic small vessel disease.  Multiple chronic hypertensive micro bleeds.  R cerebellar and L occipital infarcts.  2D Echo EF 60-65%. No source of embolus   TEE pending, discussed with nursing hold off on extubation until TEE complete  EEG no sz  LDL 77  HgbA1c 5.7  HIV negative  SARS coronavirus 2 neg  Heparin 5000 units sq tid for VTE prophylaxis  No antithrombotic prior to admission, now on aspirin 81 mg daily (300 supp if no tube)  Therapy recommendations:  pending   Disposition:  pending   Acute hypercarbic and hypoxemic respiratory failure in setting of right hemapheresis and hypertensive emergency   History of intermittent asthma   intubated in ED  Sedated on fentanyl (high TG on  propofol)  BNP 2574.5  weaning  CCM on board   Hypertensive emergency  Hypertension  BP on arrival 269/193  Home meds:  No listed BP meds on MAR  Currently on Cleviprex   SBP goal now < 180 . Long-term BP goal normotensive  Hyperlipidemia  Home meds:  lipitor 20, resumed in hospital  LDL 77, goal < 70  Continue statin at discharge  Dysphagia . Secondary to stroke . NPO . Speech will follow up post extubation   Other Stroke Risk Factors  Morbid obesity, Body mass index is 42.59 kg/m., recommend weight loss, diet and exercise as appropriate   Other Active Problems  Asthma  Leukocytosis WBC 8.6->12.9  Acute blood loss anemia post IR 13.1->10.0->9.5  Hypokalemia 3.4  ARF Cr 1.39  Prolonged QT  Hospital day # 2  This patient is critically ill and at significant risk of neurological worsening, death and care requires constant monitoring of vital signs, hemodynamics,respiratory and cardiac monitoring,review of multiple databases, neurological assessment, discussion with family, other specialists and medical decision making of high complexity.  I spent 30 minutes of neurocritical care time in the care of this patient.  Sarina Ill, MD [facility]      To contact Stroke Continuity provider, please refer to [email or web address] After hours, contact General Neurology

## 2019-01-03 ENCOUNTER — Inpatient Hospital Stay (HOSPITAL_COMMUNITY): Payer: Medicaid Other

## 2019-01-03 LAB — GLUCOSE, CAPILLARY
Glucose-Capillary: 128 mg/dL — ABNORMAL HIGH (ref 70–99)
Glucose-Capillary: 125 mg/dL — ABNORMAL HIGH (ref 70–99)
Glucose-Capillary: 121 mg/dL — ABNORMAL HIGH (ref 70–99)
Glucose-Capillary: 99 mg/dL (ref 70–99)
Glucose-Capillary: 113 mg/dL — ABNORMAL HIGH (ref 70–99)
Glucose-Capillary: 112 mg/dL — ABNORMAL HIGH (ref 70–99)

## 2019-01-03 LAB — CULTURE, RESPIRATORY W GRAM STAIN: Culture: NORMAL

## 2019-01-03 LAB — MAGNESIUM
Magnesium: 2.2 mg/dL (ref 1.7–2.4)
Magnesium: 2.6 mg/dL — ABNORMAL HIGH (ref 1.7–2.4)

## 2019-01-03 LAB — BASIC METABOLIC PANEL
Anion gap: 8 (ref 5–15)
BUN: 20 mg/dL (ref 8–23)
CO2: 22 mmol/L (ref 22–32)
Calcium: 9.1 mg/dL (ref 8.9–10.3)
Chloride: 110 mmol/L (ref 98–111)
Creatinine, Ser: 1.19 mg/dL — ABNORMAL HIGH (ref 0.44–1.00)
GFR calc Af Amer: 56 mL/min — ABNORMAL LOW (ref >60)
GFR calc non Af Amer: 49 mL/min — ABNORMAL LOW (ref >60)
Glucose, Bld: 136 mg/dL — ABNORMAL HIGH (ref 70–99)
Potassium: 3.6 mmol/L (ref 3.5–5.1)
Sodium: 140 mmol/L (ref 135–145)

## 2019-01-03 LAB — PHOSPHORUS
Phosphorus: 3.7 mg/dL (ref 2.5–4.6)
Phosphorus: 2.9 mg/dL (ref 2.5–4.6)

## 2019-01-03 LAB — PROTEIN / CREATININE RATIO, URINE
Creatinine, Urine: 148.5 mg/dL
Protein Creatinine Ratio: 0.23 mg/mg{Cre} — ABNORMAL HIGH (ref 0.00–0.15)
Total Protein, Urine: 34 mg/dL

## 2019-01-03 LAB — SODIUM, URINE, RANDOM: Sodium, Ur: <10 mmol/L

## 2019-01-03 MED ORDER — CHLORHEXIDINE GLUCONATE CLOTH 2 % EX PADS
6.0000 | MEDICATED_PAD | Freq: Every day | CUTANEOUS | Status: DC
  Administered 2019-01-03 – 2019-01-08 (×6): 6 via TOPICAL

## 2019-01-03 MED ORDER — SODIUM CHLORIDE 0.9 % IV SOLN
2.0000 g | Freq: Every day | INTRAVENOUS | Status: AC
  Administered 2019-01-03 – 2019-01-09 (×7): 2 g via INTRAVENOUS
  Filled 2019-01-03 (×5): qty 2
  Filled 2019-01-03: qty 20
  Filled 2019-01-03: qty 2

## 2019-01-03 MED ORDER — LABETALOL HCL 5 MG/ML IV SOLN
10.0000 mg | INTRAVENOUS | Status: DC | PRN
  Administered 2019-01-03 – 2019-01-06 (×10): 10 mg via INTRAVENOUS
  Filled 2019-01-03 (×10): qty 4

## 2019-01-03 MED ORDER — POTASSIUM CHLORIDE 20 MEQ/15ML (10%) PO SOLN: 20.0000 meq | Freq: Once | ORAL | Status: DC

## 2019-01-03 MED ORDER — ATORVASTATIN CALCIUM 10 MG PO TABS
20.0000 mg | ORAL_TABLET | Freq: Every day | ORAL | Status: DC
  Administered 2019-01-04 – 2019-01-09 (×6): 20 mg
  Filled 2019-01-03 (×6): qty 2

## 2019-01-03 NOTE — Progress Notes (Signed)
NAME:  Alejandra Vega, MRN:  [id number], DOB:  11-25-1955, LOS: 3 ADMISSION DATE:  01/12/2019, CONSULTATION DATE:  01/01/2019 REFERRING MD:  Dr. Leonie Man CHIEF COMPLAINT: Respiratory failure  Brief History   Patient is 63 year old woman w/ PMH of asthma, obesity HTN who was admitted for AMS, SOB, and right-sided weakness and found to have hypertensive urgency and occluded L ICA, intracranially and extracranially, L MCA and L ACA. S/p bilateral common carotid arteriogram followed by complete TICI 3 revascularization, now on vent and weaning.  PCCM consulted for vent management  History of present illness   Patient presents to the ED on 8/4 initially with confusion, fall, and reported R-sided weakness. Noted to have SOB on arrival with EMS. Unknown last normal time or timeframe for symptoms., but out of window for tPA. Patient acutely worsening in the ED with significant increased SOB and HTN. Patient was intubated and taken emergently for CT head to r/o bleed which was negative. Postintubation blood pressure 269/193. CTA head/neck shows acute carotid occlusion and patient taken emergently for revascularization. Thrombectomy of the occluded L ICA achieved complete TICI 3 revascularization. Neuro and PCCM consulted.   Past Medical History  Moderate intermittent asthma w/ 20% improvement in FEV1 bronchodilator response, obesity, HTN, gastric ulcers, anxiety, and anemia.  Significant Hospital Events   8/4 <<< Intubated in ED 8/4 <<< thrombectomy of occluded L ICA w/ complete TICI 3 revascularization  Consults:  Neuro 8/4 PCCM 8/4  Procedures:  Thrombectomy 8/4  Significant Diagnostic Tests:  CXR 8/4 << ET tube tip 1 cm above the level of carina. NG tube below the diaphragm. Nonspecific patchy airspace opacity in the right mid lung likely atelectasis and/or early infectious etiology CT head wo contrast 8/4 << No acute intracranial abnormality. Mild chronic microvascular ischemic changes  including prior right cerebellar infarct. CTA Neck 8/4 << Aortic atherosclerosis. No aneurysm or dissection. Occluded L ICA. Patent R ICA and vertebral arteries CTA Head 8/4 << Focal stenosis in the right P1 segment of 50%. Acute/subacute infarction left parieto-occipital brain  Brain MRI 8/5 << Multifocal acute ischemia, mostly L hemisphere, largest infarct in L ACA territory, chronic ischemic microangiopathy and multiple chronic hypertensive micro bleeds. Old R cerebellar and L occipital lobe infarcts. CXR 8/5 << L lower lobe atelectasis. No pleural effusion or pneumothorax. TTE 8/5 <<< EF 55-60%, elevated LVEDP and left atrial filling pressure EEG 8/5 <<< Suggestive of severe diffuse encephalopathy. No seizures or epileptiform discharges were seen TEE 8/6 <<< Severe LVH, normal LVSF, dilated LA, no thrombus, no intracardiac shunt CXR 8/6 <<< Tube in appropriate positions.Stable cardiomegaly. Areas of consolidation in each lung base, likely multifocal pneumonia.  Micro Data:  SARS Coronavirus 2 8/4 <<< Negative MRSA PCR 8/4 <<< Negative Respiratory culture 8/4 <<< No growth in 2 days Gram stain 8/4 <<< Mod WBC, mostly PMN, rare gram + cocci in pairs Blood culture 8/4 <<< No growth in 2 days  Antimicrobials:    Interim history/subjective:  Attempted wean yesterday failed. She was transitioned from propofol to precedex. Continues to be on vent and sedation. TEE did not reveal any thrombus but showed severe LVH and mildly dilated LA, putting patient at risk for Afib. No acute events overnight. On 0.4 of precedex with no changes on exam. Continues to move all extremities except RUE. Looks fluid overloaded.   Objective   Blood pressure (!) 150/82, pulse (!) 58, temperature 99.1 F (37.3 C), temperature source Axillary, resp. rate (!) 23, height 5'  5" (1.651 m), weight 116.9 kg, SpO2 100 %.    Vent Mode: CPAP;PSV FiO2 (%):  [30 %-40 %] 40 % Set Rate:  [24 bmp] 24 bmp Vt Set:  [380 mL-390  mL] 380 mL PEEP:  [5 cmH20] 5 cmH20 Pressure Support:  [10 cmH20] 10 cmH20 Plateau Pressure:  [19 cmH20-20 cmH20] 19 cmH20   Intake/Output Summary (Last 24 hours) at 01/03/2019 0803 Last data filed at 01/03/2019 0502 Gross per 24 hour  Intake 1523.26 ml  Output 700 ml  Net 823.26 ml   Filed Weights   01/01/19 0448 01/02/19 0443 01/03/19 0500  Weight: 115.5 kg 116.1 kg 116.9 kg    Examination: General: Still intubated with sedation. NAD, not following commands HEENT: [place]/AT. Neck supple Lungs: On vent PS/CPAP. CTAB. No increased WOB. Cardiovascular: RRR, no m/r/g Abdomen: Soft, mildly distended Skin: R groin incision soft w/o active bleeding or hematoma. Extremities: Distal pulses palpable bilaterally with Doppler Neuro: On vent and sedation. Eyes closed w/ bilateral pupil unreactive to light. Can spontaneously move left side and RLE but no spontaneous movements of RUE. Does not follow commands  Resolved Hospital Problem list     Assessment & Plan:  Acute Respiratory failure. - Hx of intermittent asthma on symbicort. Likely multifactorial with element of worsening respiratory disease and volume overloaded state - CXR 8/6 w/ areas of consolidation in lung bases, concerning for multifocal pneumonia - pH 7.339, pCO2 41.2 w/ improved pO2 78 <-- 59 - On PS/CPAP with pressure support of 10 mmHg, RR 18-23, FiO2 40, PEEP 5 - On precedex 0.4 mcg/kg/hr, continue to wean and monitor HR - Mental status continues to preclude extubation - Continue scheduled budesonide, Brovana and DuoNeb - Neg sputum and blood culture so far, no indication for abx today - BNP 2574, elevated D-dimer, normal lactate - TTE 8/5 w/ EF 55-60%, elevated LVEDP and left atrial filling pressure - TEE 8/6 shows severe LVH, normal LVSF, dilated LA, no thrombus, no intracardiac shunt - At risk of atrial arrhythmia, monitor on tele. - Consider diuresis s/p extubation in the setting elevated LAP, LVDEP and respiratory  distress - F/u outpatient PFT  Left Hemispheric Stroke.  - Left ACA infarct s/p IR w/ TICI3 revascularization of occluded L PROXIMAL ICA, L MCA and L ACA - Severe right hemapheresis on exam - Likely 2/2 to hypertensive emergency - EEG suggestive of severe diffuse encephalopathy. No seizures or epileptiform discharges. - No thrombus on TEE - LDL 77, A1C 5.7 - Continue heparin 5000 units sq tid for VTE prophylaxis - Stroke team following, appreciate recs  Hypertensive emergency, Hx of HTN. - BP elevated to 269/193 on arrival, currently stable w/ BP regimen - Permissive HTN control w/ goal of sBP < 180 - Continue Cleviprex 12 mg/hr and titrate to meet BP goal - PRN Hydralazine - CT angio chest s/p extubation  Hyperglycemia. - Improved, last CBG 95 - SSI  AKI.  - Trending up, Cr of 1.51 <-- 1.35 - Unclear baseline - Avoid nephrotoxic agents - F/u BMP  Anemia. - Hgb down to 9.2 (Hct 28) from 13.3 on arrival - S/p ICA thrombectomy - R groin incision soft w/o active bleeding or hematoma. - Hx of anemia  - IR following - Monitor with daily CBC  Best practice:  Diet: Tube feeds Pain/Anxiety/Delirium protocol (if indicated): Propofol VAP protocol (if indicated): N/A DVT prophylaxis: SCDs GI prophylaxis: PPI Glucose control: SSI Mobility: Bedrest Code Status: Full Family Communication: discussion with daughter in room Disposition: ICU  Labs   CBC: Recent Labs  Lab 01/05/2019 1054  01/17/2019 1832 01/01/19 0415 01/01/19 0445 01/02/19 0827 01/02/19 0858  WBC 8.5  --  8.6  --  12.9*  --  10.0  NEUTROABS 5.0  --   --   --  10.5*  --   --   HGB 13.1   < > 9.4* 10.5* 10.0* 9.5* 9.2*  HCT 43.9   < > 30.6* 31.0* 32.3* 28.0* 30.3*  MCV 81.0  --  79.9*  --  80.5  --  79.9*  PLT 395  --  285  --  292  --  282   < > = values in this interval not displayed.    Basic Metabolic Panel: Recent Labs  Lab 01/09/2019 1054 01/17/2019 1214 01/01/2019 1832 01/01/19 0415 01/01/19 0445  01/02/19 0827 01/02/19 0858 01/02/19 2127 01/03/19 0505  NA 140 139  --  140 138 140 136  --   --   K 4.2 3.9  --  3.5 3.4* 3.4* 3.4*  --   --   CL 103  --   --   --  106  --  106  --   --   CO2 25  --   --   --  20*  --  20*  --   --   GLUCOSE 209*  --   --   --  144*  --  108*  --   --   BUN 19  --   --   --  17  --  19  --   --   CREATININE 1.29*  --   --   --  1.35*  --  1.51*  --   --   CALCIUM 9.6  --   --   --  8.5*  --  8.6*  --   --   MG  --   --  1.9  --   --   --   --  2.2 2.2  PHOS  --   --  4.3  --   --   --   --  3.7 3.7   GFR: Estimated Creatinine Clearance: 48.8 mL/min (A) (by C-G formula based on SCr of 1.51 mg/dL (H)). Recent Labs  Lab 01/09/2019 1054 01/02/2019 1832 01/01/19 0445 01/02/19 0858  WBC 8.5 8.6 12.9* 10.0  LATICACIDVEN  --  0.9  --   --     Liver Function Tests: Recent Labs  Lab 01/12/2019 1054  AST 35  ALT 53*  ALKPHOS 99  BILITOT 1.1  PROT 7.9  ALBUMIN 4.1   No results for input(s): LIPASE, AMYLASE in the last 168 hours. No results for input(s): AMMONIA in the last 168 hours.  ABG    Component Value Date/Time   PHART 7.339 (L) 01/02/2019 0827   PCO2ART 41.2 01/02/2019 0827   PO2ART 78.0 (L) 01/02/2019 0827   HCO3 22.2 01/02/2019 0827   TCO2 23 01/02/2019 0827   ACIDBASEDEF 3.0 (H) 01/02/2019 0827   O2SAT 94.0 01/02/2019 0827     Coagulation Profile: No results for input(s): INR, PROTIME in the last 168 hours.  Cardiac Enzymes: No results for input(s): CKTOTAL, CKMB, CKMBINDEX, TROPONINI in the last 168 hours.  HbA1C: Hgb A1c MFr Bld  Date/Time Value Ref Range Status  01/16/2019 06:32 PM 5.7 (H) 4.8 - 5.6 % Final    Comment:    REPEATED TO VERIFY (NOTE) Pre diabetes:  5.7%-6.4% Diabetes:              >6.4% Glycemic control for   <7.0% adults with diabetes     CBG: Recent Labs  Lab 01/02/19 1520 01/02/19 1919 01/02/19 2311 01/03/19 0315 01/03/19 0731  GLUCAP 116* 100* 119* 128* 125*    Linwood Dibbles, MS4

## 2019-01-03 NOTE — Progress Notes (Signed)
STROKE TEAM PROGRESS NOTE   INTERVAL HISTORY Patient is weaning from the vent. She is still not interactive, no changes in exam, not on cleviprex, she is on small dose of precedex. Nurse at bedside. Having episodes of bradycardia.   Vitals:   01/03/19 0400 01/03/19 0404 01/03/19 0500 01/03/19 0750  BP: (!) 141/76 (!) 141/76  (!) 150/82  Pulse: (!) 55 (!) 56  (!) 58  Resp: (!) 24 (!) 24  (!) 23  Temp: 99.1 F (37.3 C)     TempSrc: Axillary     SpO2: 100% 100%  100%  Weight:   116.9 kg   Height:        CBC:  Recent Labs  Lab 01/01/2019 1054  01/01/19 0445 01/02/19 0827 01/02/19 0858  WBC 8.5   < > 12.9*  --  10.0  NEUTROABS 5.0  --  10.5*  --   --   HGB 13.1   < > 10.0* 9.5* 9.2*  HCT 43.9   < > 32.3* 28.0* 30.3*  MCV 81.0   < > 80.5  --  79.9*  PLT 395   < > 292  --  282   < > = values in this interval not displayed.    Basic Metabolic Panel:  Recent Labs  Lab 01/01/19 0445 01/02/19 0827 01/02/19 0858 01/02/19 2127 01/03/19 0505  NA 138 140 136  --   --   K 3.4* 3.4* 3.4*  --   --   CL 106  --  106  --   --   CO2 20*  --  20*  --   --   GLUCOSE 144*  --  108*  --   --   BUN 17  --  19  --   --   CREATININE 1.35*  --  1.51*  --   --   CALCIUM 8.5*  --  8.6*  --   --   MG  --   --   --  2.2 2.2  PHOS  --   --   --  3.7 3.7   Lipid Panel:     Component Value Date/Time   CHOL 146 01/01/2019 0823   TRIG 115 01/02/2019 0438   HDL 30 (L) 01/01/2019 0823   CHOLHDL 4.9 01/01/2019 0823   VLDL 39 01/01/2019 0823   LDLCALC 77 01/01/2019 0823   HgbA1c:  Lab Results  Component Value Date   HGBA1C 5.7 (H) 12/30/2018   Urine Drug Screen:     Component Value Date/Time   LABOPIA NONE DETECTED 01/13/2019 2036   COCAINSCRNUR NONE DETECTED 01/07/2019 2036   COCAINSCRNUR Negative 01/15/2019 2036   LABBENZ NONE DETECTED 01/07/2019 2036   AMPHETMU NONE DETECTED 01/17/2019 2036   THCU NONE DETECTED 01/08/2019 2036   LABBARB NONE DETECTED 01/14/2019 2036   LABBARB  Negative 01/26/2019 2036    Alcohol Level No results found for: ETH  IMAGING Ct Head Wo Contrast 01/10/2019 1. No acute intracranial abnormality. 2. 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   Ct Angio Head W Or Wo Contrast Ct Angio Neck W Or Wo Contrast Ct Cerebral Perfusion W Contrast 12/28/2018 Occlusion of the left internal carotid artery in the bulb. Flow in the major left intracranial vessels is demonstrated, consistent with external to internal collateral, anterior communicating and posterior communicating contributions. There is low-density in the left parieto-occipital brain probably representing acute to subacute infarction. No correctable large or medium vessel occlusion is identified presently.  Watershed pattern affects the left hemisphere with insult to the deep white matter and the left parieto-occipital cortex. Endotracheal tube in left mainstem bronchus. Dependent pulmonary infiltrate and hazy opacity elsewhere. Findings could be due to aspiration or community acquired bacterial or viral pneumonia.   Cerebral angiogram 01/02/2019 S/P bilateral common carotid arteriogram followed by complete revascularization of occluded Lt ICA, intracranially and extracranially , and Lt MCA and Lt ACA with x 2 passes with 47mm x 49mm embotrap and x 1 pass with 64mmx 40 mm solitaire FR and x 1 pass with solitaire 64mm x 40 mm X device achieving a TICI 3 revascularization.  Mr Brain Wo Contrast 01/01/2019 1. Multifocal acute ischemia, predominantly within the left hemisphere, with the largest area of infarct located in the left anterior cerebral artery territory. 2. 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 3. 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   Dg Chest Port 1 View 01/02/2019 pending  01/01/2019 Recommend withdrawal of endotracheal tube 2.5 cm. Enlargement of cardiac silhouette with LEFT lower lobe  atelectasis.  01/05/2019 1. ET tube tip 1 cm above the level of carina. 2. NG tube below the diaphragm 3. nonspecific patchy airspace opacity in the right mid lung could be due to atelectasis and/or early infectious etiology.   2D Echocardiogram  1. The left ventricle has normal systolic function with an ejection fraction of 60-65%. The cavity size was normal. There is moderate concentric left ventricular hypertrophy. Left ventricular diastolic Doppler parameters are consistent with  pseudonormalization.  2. The right ventricle has normal systolic function. The cavity was normal. There is no increase in right ventricular wall thickness.  3. Left atrial size was mildly dilated.  4. Right atrial size was severely dilated.  5. Aortic valve regurgitation is mild by color flow Doppler. No stenosis of the aortic valve.  6. The aorta is normal in size and structure.  7. The aortic root, ascending aorta and aortic arch are normal in size and structure.  8. There is moderate dilatation of the ascending aorta.  9. The atrial septum is grossly normal.  EEG This study is suggestive of severe diffuse encephalopathy. No seizures or epileptiform discharges were seen throughout the recording.  TEE Normal size and systolic function of the left ventricle, with severe left ventricular hypertrophy. The left atrium is dilated, but there is no thrombus and no spontaneous echo contrast seen. Left atrial appendage velocities are normal. No significant valve abnormalities. Normal aorta. No evidence of intracardiac shunt by saline contrast study.   PHYSICAL EXAM: Obese middle-aged African-American lady who is sedated and intubated. . Afebrile. Head is nontraumatic. Neck is supple without bruit.    Cardiac exam no murmur or gallop. Lungs are clear to auscultation. Distal pulses are well felt. Neurological Exam :  Patient is sedated and intubated.  Eyes are closed.  She does not open eyes to sternal rub.  Eyes in  primary position.  Doll's eye movements are present.  Pupils round and reactive 47mm.  Fundi not visualized due to noncooperation.  She has no spontaneous, withdraws slightly in all 4 extremities..  Tone increased on the right. Reflexes are increased on the right compared to the left.  Right plantar upgoing left equiv.  ASSESSMENT/PLAN Alejandra Vega is a 63 y.o. female with history of hypertension, heart murmur, asthma ID presenting following a fall to the right.  Found to be hypertensive with progressive shortness of breath.  Exam found severe right hemapheresis. Not  a tPA candidate. Taken to IR.  Stroke:  Multifocal left brain infarcts s/p IR w/ TICI3 revascularization of occluded L PROXIMAL ICA, L MCA and L ACA - embolic secondary to unknown source  CT head No acute abnormality. Small vessel disease. Old R cerebellar infarct.   CTA head & neck, CT perfusion -  occlusion L ICA at bulb.  Low density L parietal-occipital lobe with watershed pattern.  ET tube L mainstem bronchus.  Dependent pulmonary infiltrate and hazy opacity otherwise.  Cerebral angiogram occluded L ICA intra and extra cranially - revascularized L ICA, L MCA and L ASA w/ embotrap, solitaire->TICI3  Post IR CT no ICH. ASPECTS 10  MRI multifocal L brain ischemia, mainly in the ACA territory.  Chronic small vessel disease.  Multiple chronic hypertensive micro bleeds.  R cerebellar and L occipital infarcts.  2D Echo EF 60-65%. No source of embolus   EEG no sz  TEE no SOE, LA dilated, neg bubble  Plan loop placement once extubated and closer to d/c  LDL 77  HgbA1c 5.7  HIV negative  SARS coronavirus 2 neg  Heparin 5000 units sq tid for VTE prophylaxis  No antithrombotic prior to admission, now on aspirin 81 mg daily (300 supp if no tube)  Therapy recommendations:  pending   Disposition:  pending   Acute hypercarbic and hypoxemic respiratory failure in setting of right hemapheresis and hypertensive  emergency   History of intermittent asthma   intubated in ED  Sedated on fentanyl (high TG on propofol)  BNP 2574.5  weaning  CCM on board   Hypertensive emergency  Hypertension  BP on arrival 269/193  Home meds:  No listed BP meds on MAR  Now off Cleviprex   SBP goal now < 180 . Long-term BP goal normotensive  Hyperlipidemia  Home meds:  lipitor 20, resumed in hospital  LDL 77, goal < 70  Continue statin at discharge  Dysphagia . Secondary to stroke . NPO . Speech will follow up post extubation   Other Stroke Risk Factors  Morbid obesity, Body mass index is 42.89 kg/m., recommend weight loss, diet and exercise as appropriate   Other Active Problems  Asthma  Leukocytosis, resolved WBC 8.6->12.9->10.0  Acute blood loss anemia post IR Hb 13.1->10.0->9.5->9.2  Hypokalemia 3.4  ARF Cr 1.39->1.51  Prolonged QT  Hospital day # 3  This patient is critically ill and at significant risk of neurological worsening, death and care requires constant monitoring of vital signs, hemodynamics,respiratory and cardiac monitoring,review of multiple databases, neurological assessment, discussion with family, other specialists and medical decision making of high complexity.  I spent 30 minutes of neurocritical care time in the care of this patient.  Sarina Ill, MD [facility]      To contact Stroke Continuity provider, please refer to [email or web address] After hours, contact General Neurology

## 2019-01-04 ENCOUNTER — Inpatient Hospital Stay (HOSPITAL_COMMUNITY): Payer: Medicaid Other

## 2019-01-04 LAB — GLUCOSE, CAPILLARY
Glucose-Capillary: 117 mg/dL — ABNORMAL HIGH (ref 70–99)
Glucose-Capillary: 107 mg/dL — ABNORMAL HIGH (ref 70–99)
Glucose-Capillary: 112 mg/dL — ABNORMAL HIGH (ref 70–99)
Glucose-Capillary: 115 mg/dL — ABNORMAL HIGH (ref 70–99)
Glucose-Capillary: 122 mg/dL — ABNORMAL HIGH (ref 70–99)
Glucose-Capillary: 120 mg/dL — ABNORMAL HIGH (ref 70–99)

## 2019-01-04 LAB — BASIC METABOLIC PANEL
Anion gap: 9 (ref 5–15)
BUN: 29 mg/dL — ABNORMAL HIGH (ref 8–23)
CO2: 22 mmol/L (ref 22–32)
Calcium: 8.9 mg/dL (ref 8.9–10.3)
Chloride: 111 mmol/L (ref 98–111)
Creatinine, Ser: 1.12 mg/dL — ABNORMAL HIGH (ref 0.44–1.00)
GFR calc Af Amer: >60 mL/min (ref >60)
GFR calc non Af Amer: 52 mL/min — ABNORMAL LOW (ref >60)
Glucose, Bld: 116 mg/dL — ABNORMAL HIGH (ref 70–99)
Potassium: 3.6 mmol/L (ref 3.5–5.1)
Sodium: 142 mmol/L (ref 135–145)

## 2019-01-04 LAB — PHOSPHORUS: Phosphorus: 2.8 mg/dL (ref 2.5–4.6)

## 2019-01-04 LAB — CBC
HCT: 27.9 % — ABNORMAL LOW (ref 36.0–46.0)
Hemoglobin: 8.4 g/dL — ABNORMAL LOW (ref 12.0–15.0)
MCH: 24.5 pg — ABNORMAL LOW (ref 26.0–34.0)
MCHC: 30.1 g/dL (ref 30.0–36.0)
MCV: 81.3 fL (ref 80.0–100.0)
Platelets: 303 10*3/uL (ref 150–400)
RBC: 3.43 MIL/uL — ABNORMAL LOW (ref 3.87–5.11)
RDW: 18.1 % — ABNORMAL HIGH (ref 11.5–15.5)
WBC: 8.5 10*3/uL (ref 4.0–10.5)
nRBC: 0 % (ref 0.0–0.2)

## 2019-01-04 LAB — MAGNESIUM: Magnesium: 2.3 mg/dL (ref 1.7–2.4)

## 2019-01-04 MED ORDER — POTASSIUM CHLORIDE 20 MEQ/15ML (10%) PO SOLN
40.0000 meq | Freq: Once | ORAL | Status: AC
  Administered 2019-01-04: 40 meq
  Filled 2019-01-04: qty 30

## 2019-01-04 MED ORDER — FUROSEMIDE 10 MG/ML IJ SOLN
40.0000 mg | Freq: Once | INTRAMUSCULAR | Status: AC
  Administered 2019-01-04: 40 mg via INTRAVENOUS
  Filled 2019-01-04: qty 4

## 2019-01-04 MED ORDER — ARFORMOTEROL TARTRATE 15 MCG/2ML IN NEBU
15.0000 ug | INHALATION_SOLUTION | Freq: Two times a day (BID) | RESPIRATORY_TRACT | Status: DC
  Administered 2019-01-04 – 2019-01-09 (×10): 15 ug via RESPIRATORY_TRACT
  Filled 2019-01-04 (×9): qty 2

## 2019-01-04 MED ORDER — IPRATROPIUM-ALBUTEROL 0.5-2.5 (3) MG/3ML IN SOLN: 3.0000 mL | RESPIRATORY_TRACT | Status: DC | PRN

## 2019-01-04 NOTE — Progress Notes (Signed)
STROKE TEAM PROGRESS NOTE   INTERVAL HISTORY Patient is improving.  Patient is still weaning from the vent and doing well. She is still not interactive but opening her eyes to stim and voice which is a bog improvement, no changes in exam, not on cleviprex just prn, she was on small dose of precedex now off. Nurse at bedside. Having no more episodes of bradycardia. I spoke at length with daughter on the phone today.  Vitals:   01/04/19 0400 01/04/19 0500 01/04/19 0600 01/04/19 0700  BP: 134/76 (!) 154/91 (!) 165/92 (!) 168/88  Pulse: (!) 58 71 71 71  Resp: 19 19 (!) 24 (!) 24  Temp: 100 F (37.8 C)     TempSrc: Oral     SpO2: 100% 100% 99% 100%  Weight:  119.4 kg    Height:        CBC:  Recent Labs  Lab 01/08/2019 1054  01/01/19 0445  01/02/19 0858 01/04/19 0526  WBC 8.5   < > 12.9*  --  10.0 8.5  NEUTROABS 5.0  --  10.5*  --   --   --   HGB 13.1   < > 10.0*   < > 9.2* 8.4*  HCT 43.9   < > 32.3*   < > 30.3* 27.9*  MCV 81.0   < > 80.5  --  79.9* 81.3  PLT 395   < > 292  --  282 303   < > = values in this interval not displayed.    Basic Metabolic Panel:  Recent Labs  Lab 01/03/19 1108 01/03/19 1831 01/04/19 0526  NA 140  --  142  K 3.6  --  3.6  CL 110  --  111  CO2 22  --  22  GLUCOSE 136*  --  116*  BUN 20  --  29*  CREATININE 1.19*  --  1.12*  CALCIUM 9.1  --  8.9  MG  --  2.6* 2.3  PHOS  --  2.9 2.8   Lipid Panel:     Component Value Date/Time   CHOL 146 01/01/2019 0823   TRIG 115 01/02/2019 0438   HDL 30 (L) 01/01/2019 0823   CHOLHDL 4.9 01/01/2019 0823   VLDL 39 01/01/2019 0823   LDLCALC 77 01/01/2019 0823   HgbA1c:  Lab Results  Component Value Date   HGBA1C 5.7 (H) 01/20/2019   Urine Drug Screen:     Component Value Date/Time   LABOPIA NONE DETECTED 01/19/2019 2036   COCAINSCRNUR NONE DETECTED 01/17/2019 2036   COCAINSCRNUR Negative 01/02/2019 2036   LABBENZ NONE DETECTED 12/28/2018 2036   AMPHETMU NONE DETECTED 01/10/2019 2036   THCU NONE  DETECTED 01/21/2019 2036   LABBARB NONE DETECTED 01/06/2019 2036   LABBARB Negative 01/25/2019 2036    Alcohol Level No results found for: ETH  IMAGING Ct Head Wo Contrast 01/19/2019 1. No acute intracranial abnormality. 2. 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   Ct Angio Head W Or Wo Contrast Ct Angio Neck W Or Wo Contrast Ct Cerebral Perfusion W Contrast 01/19/2019 Occlusion of the left internal carotid artery in the bulb. Flow in the major left intracranial vessels is demonstrated, consistent with external to internal collateral, anterior communicating and posterior communicating contributions. There is low-density in the left parieto-occipital brain probably representing acute to subacute infarction. No correctable large or medium vessel occlusion is identified presently. Watershed pattern affects the left hemisphere with insult to the deep white  matter and the left parieto-occipital cortex. Endotracheal tube in left mainstem bronchus. Dependent pulmonary infiltrate and hazy opacity elsewhere. Findings could be due to aspiration or community acquired bacterial or viral pneumonia.   Cerebral angiogram 01/15/2019 S/P bilateral common carotid arteriogram followed by complete revascularization of occluded Lt ICA, intracranially and extracranially , and Lt MCA and Lt ACA with x 2 passes with 37mm x 8mm embotrap and x 1 pass with 58mmx 40 mm solitaire FR and x 1 pass with solitaire 25mm x 40 mm X device achieving a TICI 3 revascularization.  Mr Brain Wo Contrast 01/01/2019 1. Multifocal acute ischemia, predominantly within the left hemisphere, with the largest area of infarct located in the left anterior cerebral artery territory. 2. 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 3. 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   Dg Chest Port 1 View 01/02/2019 pending  01/01/2019 Recommend withdrawal of  endotracheal tube 2.5 cm. Enlargement of cardiac silhouette with LEFT lower lobe atelectasis.  12/30/2018 1. ET tube tip 1 cm above the level of carina. 2. NG tube below the diaphragm 3. nonspecific patchy airspace opacity in the right mid lung could be due to atelectasis and/or early infectious etiology.   2D Echocardiogram  1. The left ventricle has normal systolic function with an ejection fraction of 60-65%. The cavity size was normal. There is moderate concentric left ventricular hypertrophy. Left ventricular diastolic Doppler parameters are consistent with  pseudonormalization.  2. The right ventricle has normal systolic function. The cavity was normal. There is no increase in right ventricular wall thickness.  3. Left atrial size was mildly dilated.  4. Right atrial size was severely dilated.  5. Aortic valve regurgitation is mild by color flow Doppler. No stenosis of the aortic valve.  6. The aorta is normal in size and structure.  7. The aortic root, ascending aorta and aortic arch are normal in size and structure.  8. There is moderate dilatation of the ascending aorta.  9. The atrial septum is grossly normal.  EEG This study is suggestive of severe diffuse encephalopathy. No seizures or epileptiform discharges were seen throughout the recording.  TEE Normal size and systolic function of the left ventricle, with severe left ventricular hypertrophy. The left atrium is dilated, but there is no thrombus and no spontaneous echo contrast seen. Left atrial appendage velocities are normal. No significant valve abnormalities. Normal aorta. No evidence of intracardiac shunt by saline contrast study.   PHYSICAL EXAM: Obese middle-aged African-American lady who is sedated and intubated. . Afebrile. Head is nontraumatic. Neck is supple without bruit.    Cardiac exam no murmur or gallop. Lungs are clear to auscultation. Distal pulses are well felt. Neurological Exam :  Patient is not  dedated and intubated.  Eyes are closed but she does open them to voice and stim today, not tracking, not blinking to threat but this is an improvement.   Eyes conjuagte in primary position.  Doll's eye movements are present.  Pupils round and reactive 45mm.  Fundi not visualized due to noncooperation.  She has no spontaneous movements, but  withdraws in 4 extremities, least in the right UE.Marland Kitchen  Left more active. Tone increased on the right. Reflexes are increased on the right compared to the left.  Right plantar upgoing left equiv.withdraws to stim more on the left.   ASSESSMENT/PLAN Ms. SEIDY LABRECK is a 63 y.o. female with history of hypertension, heart murmur, asthma ID presenting following a fall to  the right.  Found to be hypertensive with progressive shortness of breath.  Exam found severe right hemapheresis. Not a tPA candidate. Taken to IR.  Stroke:  Multifocal left brain infarcts s/p IR w/ TICI3 revascularization of occluded L PROXIMAL ICA, L MCA and L ACA - embolic secondary to unknown source  CT head No acute abnormality. Small vessel disease. Old R cerebellar infarct.   CTA head & neck, CT perfusion -  occlusion L ICA at bulb.  Low density L parietal-occipital lobe with watershed pattern.  ET tube L mainstem bronchus.  Dependent pulmonary infiltrate and hazy opacity otherwise.  Cerebral angiogram occluded L ICA intra and extra cranially - revascularized L ICA, L MCA and L ASA w/ embotrap, solitaire->TICI3  Post IR CT no ICH. ASPECTS 10  MRI multifocal L brain ischemia, mainly in the ACA territory.  Chronic small vessel disease.  Multiple chronic hypertensive micro bleeds.  R cerebellar and L occipital infarcts.  2D Echo EF 60-65%. No source of embolus   EEG no sz  TEE no SOE, LA dilated, neg bubble  Plan loop placement once extubated and closer to d/c  LDL 77  HgbA1c 5.7  HIV negative  SARS coronavirus 2 neg  Heparin 5000 units sq tid for VTE prophylaxis  No  antithrombotic prior to admission, now on aspirin 81 mg daily (300 supp if no tube)  Therapy recommendations:  pending   Disposition:  pending   Acute hypercarbic and hypoxemic respiratory failure in setting of right hemapheresis and hypertensive emergency   History of intermittent asthma   intubated in ED  Sedated on fentanyl (high TG on propofol)  BNP 2574.5  weaning  CCM on board   Hypertensive emergency  Hypertension  BP on arrival 269/193  Home meds:  No listed BP meds on MAR  Now off Cleviprex   SBP goal now < 180 . Long-term BP goal normotensive  Hyperlipidemia  Home meds:  lipitor 20, resumed in hospital  LDL 77, goal < 70  Continue statin at discharge  Dysphagia . Secondary to stroke . NPO . Speech will follow up post extubation   Other Stroke Risk Factors  Morbid obesity, Body mass index is 43.8 kg/m., recommend weight loss, diet and exercise as appropriate   Other Active Problems  Asthma  Leukocytosis, resolved WBC 8.6->12.9->10.0->8.5  Acute blood loss anemia post IR Hb 13.1->10.0->9.5->9.2->8.4  Hypokalemia 3.4->3.6  ARF Cr 1.39->1.51->1.12  Prolonged QT  Episodes of bradycardia  Cardiology recommendations - Recommend extended monitoring for atrial fibrillation, strongly consider implantable loop recorder if atrial fibrillation is not recorded during the current admission.  Internal Medicine following   Remains intubated  Hospital day # 4  This patient is critically ill and at significant risk of neurological worsening, death and care requires constant monitoring of vital signs, hemodynamics,respiratory and cardiac monitoring,review of multiple databases, neurological assessment, discussion with family, other specialists and medical decision making of high complexity. She is improving, opening her eyes todaty, I discussed with daughter, still unknown etiology of embolus.  I spent 30 minutes of neurocritical care time in the  care of this patient.        To contact Stroke Continuity provider, please refer to [email or web address] After hours, contact General Neurology

## 2019-01-04 NOTE — Plan of Care (Signed)
  Problem: Education: Goal: Knowledge of disease or condition will improve Outcome: Not Progressing  Patient is intubated. Will reassess once condition improves.

## 2019-01-04 NOTE — Progress Notes (Signed)
NAME:  Alejandra Vega, MRN:  [id number], DOB:  02-08-1956, LOS: 4 ADMISSION DATE:  12/28/2018, CONSULTATION DATE:  01/27/2019 REFERRING MD:  Dr. Laverta [place], ER, CHIEF COMPLAINT:  Altered mental status   Brief History   63 yo female presented with AMS, Rt sided weakness, BP 269/193.  Required intubation for airway protection.  Past Medical History  Asthma, HTN, PUD, Anemia, Anxiety  Significant Hospital Events   8/04 Admit, thrombectomy to Lt ICA  Consults:  Neurology  Procedures:  ETT 8/04 >>   Significant Diagnostic Tests:  CT head 8/04 >> chronic ischemic changes, old Rt cerebellar infarct MRI brain 8/05 >> multifocal acute ischemia in Lt hemisphere, old Rt cerebellar and Lt occipital infarcts TEE 8/06 >> EF 60 to 65%, no thrombus, severe LVH Renal u/s 8/07 >> no hydronephrosis  Micro Data:  COVID 8/04 >> negative Sputum 8/04 >> oral flora Blood 8/04 >> Sputum 8/07 >>  Antimicrobials:  Rocephin 8/07 >>   Interim history/subjective:  Remains on vent.  Objective   Blood pressure (!) 182/94, pulse 76, temperature 99.2 F (37.3 C), temperature source Axillary, resp. rate 19, height 5\' 5"  (1.651 m), weight 119.4 kg, SpO2 100 %.    Vent Mode: CPAP;PSV FiO2 (%):  [30 %-40 %] 40 % Set Rate:  [24 bmp] 24 bmp Vt Set:  [380 mL] 380 mL PEEP:  [5 cmH20] 5 cmH20 Pressure Support:  [10 cmH20] 10 cmH20 Plateau Pressure:  [20 cmH20-25 cmH20] 20 cmH20   Intake/Output Summary (Last 24 hours) at 01/04/2019 0951 Last data filed at 01/04/2019 0700 Gross per 24 hour  Intake 1820.76 ml  Output 400 ml  Net 1420.76 ml   Filed Weights   01/02/19 0443 01/03/19 0500 01/04/19 0500  Weight: 116.1 kg 116.9 kg 119.4 kg    Examination:  General - sedated Eyes - pupils reactive ENT - ETT in place, protuberant tongue Cardiac - regular rate/rhythm, no murmur Chest - equal breath sounds b/l, no wheezing or rales Abdomen - soft, non tender, + bowel sounds Extremities - 1+ edema Skin - no  rashes Neuro - RASS -2, opens eyes with stimulation, not following commands, weak on Rt side  CXR (reviewed by me) - CM, decreased Rt base ASD  Resolved Hospital Problem list     Assessment & Plan:   Acute hypoxic, hypercapnic respiratory failure from compromised airway. Hx of asthma. - mental status and body habitus barriers to vent weaning at this time - cycle on pressure support as able - f/u CXR - scheduled BDs - lasix 40 mg IV x one 8/08  Aspiration pneumonia. - day 2 of rocephin  Multifocal CVA s/p thrombectomy to Lt ICA. - continue ASA  HTN emergency. HLD. - goal SBP < 180 - lipitor  Anemia of critical illness. - f/u CBC - transfuse for Hb < 7 or significant bleeding  Best practice:  Diet: tube feeds DVT prophylaxis: SQ heparin GI prophylaxis: protonix Mobility: bed rest Code Status: full code Disposition: ICU  Labs    CMP Latest Ref Rng & Units 01/04/2019 01/03/2019 01/02/2019  Glucose 70 - 99 mg/dL 116(H) 136(H) 108(H)  BUN 8 - 23 mg/dL 29(H) 20 19  Creatinine 0.44 - 1.00 mg/dL 1.12(H) 1.19(H) 1.51(H)  Sodium 135 - 145 mmol/L 142 140 136  Potassium 3.5 - 5.1 mmol/L 3.6 3.6 3.4(L)  Chloride 98 - 111 mmol/L 111 110 106  CO2 22 - 32 mmol/L 22 22 20(L)  Calcium 8.9 - 10.3 mg/dL 8.9 9.1 8.6(L)  Total Protein 6.5 - 8.1 g/dL - - -  Total Bilirubin 0.3 - 1.2 mg/dL - - -  Alkaline Phos 38 - 126 U/L - - -  AST 15 - 41 U/L - - -  ALT 0 - 44 U/L - - -   CBC Latest Ref Rng & Units 01/04/2019 01/02/2019 01/02/2019  WBC 4.0 - 10.5 K/uL 8.5 10.0 -  Hemoglobin 12.0 - 15.0 g/dL 8.4(L) 9.2(L) 9.5(L)  Hematocrit 36.0 - 46.0 % 27.9(L) 30.3(L) 28.0(L)  Platelets 150 - 400 K/uL 303 282 -   ABG    Component Value Date/Time   PHART 7.339 (L) 01/02/2019 0827   PCO2ART 41.2 01/02/2019 0827   PO2ART 78.0 (L) 01/02/2019 0827   HCO3 22.2 01/02/2019 0827   TCO2 23 01/02/2019 0827   ACIDBASEDEF 3.0 (H) 01/02/2019 0827   O2SAT 94.0 01/02/2019 0827   CBG (last 3)  Recent Labs     01/03/19 2306 01/04/19 0306 01/04/19 0743  GLUCAP 112* 117* 107*    CC time 33 minutes  Chesley Mires, MD [facility] Pulmonary/Critical Care 01/04/2019, 10:05 AM

## 2019-01-05 ENCOUNTER — Inpatient Hospital Stay (HOSPITAL_COMMUNITY): Payer: Medicaid Other

## 2019-01-05 LAB — CULTURE, BLOOD (ROUTINE X 2)
Culture: NO GROWTH
Culture: NO GROWTH

## 2019-01-05 LAB — BASIC METABOLIC PANEL
Anion gap: 9 (ref 5–15)
BUN: 29 mg/dL — ABNORMAL HIGH (ref 8–23)
CO2: 26 mmol/L (ref 22–32)
Calcium: 9.2 mg/dL (ref 8.9–10.3)
Chloride: 110 mmol/L (ref 98–111)
Creatinine, Ser: 0.93 mg/dL (ref 0.44–1.00)
GFR calc Af Amer: >60 mL/min (ref >60)
GFR calc non Af Amer: >60 mL/min (ref >60)
Glucose, Bld: 122 mg/dL — ABNORMAL HIGH (ref 70–99)
Potassium: 3.8 mmol/L (ref 3.5–5.1)
Sodium: 145 mmol/L (ref 135–145)

## 2019-01-05 LAB — GLUCOSE, CAPILLARY
Glucose-Capillary: 117 mg/dL — ABNORMAL HIGH (ref 70–99)
Glucose-Capillary: 128 mg/dL — ABNORMAL HIGH (ref 70–99)
Glucose-Capillary: 103 mg/dL — ABNORMAL HIGH (ref 70–99)
Glucose-Capillary: 104 mg/dL — ABNORMAL HIGH (ref 70–99)
Glucose-Capillary: 108 mg/dL — ABNORMAL HIGH (ref 70–99)
Glucose-Capillary: 115 mg/dL — ABNORMAL HIGH (ref 70–99)

## 2019-01-05 LAB — CBC
HCT: 29 % — ABNORMAL LOW (ref 36.0–46.0)
Hemoglobin: 8.6 g/dL — ABNORMAL LOW (ref 12.0–15.0)
MCH: 24.4 pg — ABNORMAL LOW (ref 26.0–34.0)
MCHC: 29.7 g/dL — ABNORMAL LOW (ref 30.0–36.0)
MCV: 82.4 fL (ref 80.0–100.0)
Platelets: 326 10*3/uL (ref 150–400)
RBC: 3.52 MIL/uL — ABNORMAL LOW (ref 3.87–5.11)
RDW: 18.4 % — ABNORMAL HIGH (ref 11.5–15.5)
WBC: 7.6 10*3/uL (ref 4.0–10.5)
nRBC: 0 % (ref 0.0–0.2)

## 2019-01-05 LAB — CULTURE, RESPIRATORY W GRAM STAIN

## 2019-01-05 MED ORDER — CHLORHEXIDINE GLUCONATE 0.12 % MT SOLN
OROMUCOSAL | Status: AC
  Administered 2019-01-05: 09:00:00 15 mL via OROMUCOSAL
  Filled 2019-01-05: qty 15

## 2019-01-05 MED ORDER — FUROSEMIDE 10 MG/ML IJ SOLN
40.0000 mg | Freq: Once | INTRAMUSCULAR | Status: AC
  Administered 2019-01-05: 40 mg via INTRAVENOUS
  Filled 2019-01-05: qty 4

## 2019-01-05 NOTE — Progress Notes (Signed)
NAME:  Alejandra Vega, MRN:  [id number], DOB:  1955-07-04, LOS: 5 ADMISSION DATE:  01/12/2019, CONSULTATION DATE:  12/30/2018 REFERRING MD:  Dr. Laverta Baltimore, ER, CHIEF COMPLAINT:  Altered mental status   Brief History   63 yo female presented with AMS, Rt sided weakness, BP 269/193.  Required intubation for airway protection.  Past Medical History  Asthma, HTN, PUD, Anemia, Anxiety  Significant Hospital Events   8/04 Admit, thrombectomy to Lt ICA  Consults:  Neurology  Procedures:  ETT 8/04 >>   Significant Diagnostic Tests:  CT head 8/04 >> chronic ischemic changes, old Rt cerebellar infarct MRI brain 8/05 >> multifocal acute ischemia in Lt hemisphere, old Rt cerebellar and Lt occipital infarcts TEE 8/06 >> EF 60 to 65%, no thrombus, severe LVH Renal u/s 8/07 >> no hydronephrosis  Micro Data:  COVID 8/04 >> negative Sputum 8/04 >> oral flora Blood 8/04 >> Sputum 8/07 >> Enterobacter resistant to ancef  Antimicrobials:  Rocephin 8/07 >>   Interim history/subjective:  Remains on vent.  Objective   Blood pressure (!) 148/73, pulse 80, temperature 99.9 F (37.7 C), temperature source Oral, resp. rate (!) 24, height 5\' 5"  (1.651 m), weight 117.2 kg, SpO2 100 %.    Vent Mode: PRVC FiO2 (%):  [30 %-40 %] 30 % Set Rate:  [24 bmp] 24 bmp Vt Set:  [380 mL] 380 mL PEEP:  [5 cmH20] 5 cmH20 Pressure Support:  [10 cmH20] 10 cmH20 Plateau Pressure:  [11 cmH20-20 cmH20] 11 cmH20   Intake/Output Summary (Last 24 hours) at 01/05/2019 0933 Last data filed at 01/05/2019 0700 Gross per 24 hour  Intake 1812.62 ml  Output 4200 ml  Net -2387.38 ml   Filed Weights   01/03/19 0500 01/04/19 0500 01/05/19 0500  Weight: 116.9 kg 119.4 kg 117.2 kg    Examination:  General - somnolent Eyes - pupils reactive ENT - ETT in place, protuberant tongue Cardiac - regular rate/rhythm, no murmur Chest - scattered rhonchi Abdomen - soft, non tender, + bowel sounds Extremities - 1+ edema Skin - no  rashes Neuro - RASS -1, opens eyes with stimulation, not following commands, weak on Rt side  CXR (reviewed by me) - decreased ASD on [facility] Problem list     Assessment & Plan:   Acute hypoxic, hypercapnic respiratory failure from compromised airway. Hx of asthma. - cycle on pressure support - mental status and tongue swelling barrier to extubation trial - f/u CXR intermittently - scheduled BDs - lasix 40 mg IV x one 8/09  Aspiration pneumonia with enterobacter in sputum. - day 3 of rocephin  Multifocal CVA s/p thrombectomy to Lt ICA. - continue ASA  HTN emergency. HLD. - goal SBP < 180 - continue lipitor  Anemia of critical illness. - f/u CBC - transfuse for Hb < 7 or significant bleeding  Hyperglycemia. - SSI  Best practice:  Diet: tube feeds DVT prophylaxis: SQ heparin GI prophylaxis: protonix Mobility: bed rest Code Status: full code Disposition: ICU  Labs    CMP Latest Ref Rng & Units 01/04/2019 01/03/2019 01/02/2019  Glucose 70 - 99 mg/dL 116(H) 136(H) 108(H)  BUN 8 - 23 mg/dL 29(H) 20 19  Creatinine 0.44 - 1.00 mg/dL 1.12(H) 1.19(H) 1.51(H)  Sodium 135 - 145 mmol/L 142 140 136  Potassium 3.5 - 5.1 mmol/L 3.6 3.6 3.4(L)  Chloride 98 - 111 mmol/L 111 110 106  CO2 22 - 32 mmol/L 22 22 20(L)  Calcium 8.9 - 10.3 mg/dL 8.9  9.1 8.6(L)  Total Protein 6.5 - 8.1 g/dL - - -  Total Bilirubin 0.3 - 1.2 mg/dL - - -  Alkaline Phos 38 - 126 U/L - - -  AST 15 - 41 U/L - - -  ALT 0 - 44 U/L - - -   CBC Latest Ref Rng & Units 01/04/2019 01/02/2019 01/02/2019  WBC 4.0 - 10.5 K/uL 8.5 10.0 -  Hemoglobin 12.0 - 15.0 g/dL 8.4(L) 9.2(L) 9.5(L)  Hematocrit 36.0 - 46.0 % 27.9(L) 30.3(L) 28.0(L)  Platelets 150 - 400 K/uL 303 282 -   ABG    Component Value Date/Time   PHART 7.339 (L) 01/02/2019 0827   PCO2ART 41.2 01/02/2019 0827   PO2ART 78.0 (L) 01/02/2019 0827   HCO3 22.2 01/02/2019 0827   TCO2 23 01/02/2019 0827   ACIDBASEDEF 3.0 (H) 01/02/2019 0827    O2SAT 94.0 01/02/2019 0827   CBG (last 3)  Recent Labs    01/04/19 2323 01/05/19 0314 01/05/19 0817  GLUCAP 120* 117* 128*    CC time 32 minutes  Chesley Mires, MD [place] 01/05/2019, 9:33 AM

## 2019-01-05 NOTE — Progress Notes (Signed)
STROKE TEAM PROGRESS NOTE   INTERVAL HISTORY Patient is improving.  No significant changes. Patient is still weaning from the vent. She is still not interactive but opening her eyes to stim and voice which is an improvement, no changes in exam, not on cleviprex just prn, she was on small dose of precedex now off. Nurse at bedside. Having no more episodes of bradycardia. I spoke at length with daughter on the phone.  Vitals:   01/05/19 1517 01/05/19 1600 01/05/19 1813 01/05/19 1830  BP:   (!) 237/120 (!) 163/75  Pulse:    74  Resp:    12  Temp: 98.6 F (37 C) 98.4 F (36.9 C)    TempSrc: Axillary Axillary    SpO2:    100%  Weight:      Height:        CBC:  Recent Labs  Lab 12/30/2018 1054  01/01/19 0445  01/04/19 0526 01/05/19 0905  WBC 8.5   < > 12.9*   < > 8.5 7.6  NEUTROABS 5.0  --  10.5*  --   --   --   HGB 13.1   < > 10.0*   < > 8.4* 8.6*  HCT 43.9   < > 32.3*   < > 27.9* 29.0*  MCV 81.0   < > 80.5   < > 81.3 82.4  PLT 395   < > 292   < > 303 326   < > = values in this interval not displayed.    Basic Metabolic Panel:  Recent Labs  Lab 01/03/19 1831 01/04/19 0526 01/05/19 0905  NA  --  142 145  K  --  3.6 3.8  CL  --  111 110  CO2  --  22 26  GLUCOSE  --  116* 122*  BUN  --  29* 29*  CREATININE  --  1.12* 0.93  CALCIUM  --  8.9 9.2  MG 2.6* 2.3  --   PHOS 2.9 2.8  --    Lipid Panel:     Component Value Date/Time   CHOL 146 01/01/2019 0823   TRIG 115 01/02/2019 0438   HDL 30 (L) 01/01/2019 0823   CHOLHDL 4.9 01/01/2019 0823   VLDL 39 01/01/2019 0823   LDLCALC 77 01/01/2019 0823   HgbA1c:  Lab Results  Component Value Date   HGBA1C 5.7 (H) 01/05/2019   Urine Drug Screen:     Component Value Date/Time   LABOPIA NONE DETECTED 12/28/2018 2036   COCAINSCRNUR NONE DETECTED 12/30/2018 2036   COCAINSCRNUR Negative 01/03/2019 2036   LABBENZ NONE DETECTED 01/04/2019 2036   AMPHETMU NONE DETECTED 12/30/2018 2036   THCU NONE DETECTED 01/04/2019 2036   LABBARB NONE DETECTED 01/04/2019 2036   LABBARB Negative 01/19/2019 2036    Alcohol Level No results found for: ETH  IMAGING  Ct Head Wo Contrast 01/17/2019 1. No acute intracranial abnormality. 2. 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   Ct Angio Head W Or Wo Contrast Ct Angio Neck W Or Wo Contrast Ct Cerebral Perfusion W Contrast 01/04/2019 Occlusion of the left internal carotid artery in the bulb. Flow in the major left intracranial vessels is demonstrated, consistent with external to internal collateral, anterior communicating and posterior communicating contributions. There is low-density in the left parieto-occipital brain probably representing acute to subacute infarction. No correctable large or medium vessel occlusion is identified presently. Watershed pattern affects the left hemisphere with insult to the deep white matter  and the left parieto-occipital cortex. Endotracheal tube in left mainstem bronchus. Dependent pulmonary infiltrate and hazy opacity elsewhere. Findings could be due to aspiration or community acquired bacterial or viral pneumonia.   Cerebral angiogram 01/04/2019 S/P bilateral common carotid arteriogram followed by complete revascularization of occluded Lt ICA, intracranially and extracranially , and Lt MCA and Lt ACA with x 2 passes with 65mm x 28mm embotrap and x 1 pass with 4mmx 40 mm solitaire FR and x 1 pass with solitaire 81mm x 40 mm X device achieving a TICI 3 revascularization.  Mr Brain Wo Contrast 01/01/2019 1. Multifocal acute ischemia, predominantly within the left hemisphere, with the largest area of infarct located in the left anterior cerebral artery territory. 2. 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 3. 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   Dg Chest Port 1 View 01/05/2019 IMPRESSION: 1. Support apparatus, as above. 2. Low lung volumes with probable  bibasilar subsegmental atelectasis. 3. Mild cardiomegaly.  01/01/2019 Recommend withdrawal of endotracheal tube 2.5 cm. Enlargement of cardiac silhouette with LEFT lower lobe atelectasis.   01/21/2019 1. ET tube tip 1 cm above the level of carina. 2. NG tube below the diaphragm 3. nonspecific patchy airspace opacity in the right mid lung could be due to atelectasis and/or early infectious etiology.   2D Echocardiogram  1. The left ventricle has normal systolic function with an ejection fraction of 60-65%. The cavity size was normal. There is moderate concentric left ventricular hypertrophy. Left ventricular diastolic Doppler parameters are consistent with  pseudonormalization.  2. The right ventricle has normal systolic function. The cavity was normal. There is no increase in right ventricular wall thickness.  3. Left atrial size was mildly dilated.  4. Right atrial size was severely dilated.  5. Aortic valve regurgitation is mild by color flow Doppler. No stenosis of the aortic valve.  6. The aorta is normal in size and structure.  7. The aortic root, ascending aorta and aortic arch are normal in size and structure.  8. There is moderate dilatation of the ascending aorta.  9. The atrial septum is grossly normal.  EEG This study is suggestive of severe diffuse encephalopathy. No seizures or epileptiform discharges were seen throughout the recording.  TEE Normal size and systolic function of the left ventricle, with severe left ventricular hypertrophy. The left atrium is dilated, but there is no thrombus and no spontaneous echo contrast seen. Left atrial appendage velocities are normal. No significant valve abnormalities. Normal aorta. No evidence of intracardiac shunt by saline contrast study.   PHYSICAL EXAM: Obese middle-aged African-American lady who is sedated and intubated. . Afebrile. Head is nontraumatic. Neck is supple without bruit.    Cardiac exam no murmur or gallop. Lungs  are clear to auscultation. Distal pulses are well felt. Neurological Exam :  Patient is not dedated and intubated.  Eyes are closed but she does open them to voice and stim today, not tracking, not blinking to threat but this is an improvement.   Eyes conjuagte in primary position.  Doll's eye movements are present.  Pupils round and reactive 59mm.  Fundi not visualized due to noncooperation.  She has no spontaneous movements, but  withdraws in 4 extremities, least in the right UE.Marland Kitchen  Left more active. Tone increased on the right. Reflexes are increased on the right compared to the left.  Right plantar upgoing left equiv.withdraws to stim more on the left.   ASSESSMENT/PLAN [name] is  a 63 y.o. female with history of hypertension, heart murmur, asthma ID presenting following a fall to the right.  Found to be hypertensive with progressive shortness of breath.  Exam found severe right hemapheresis. Not a tPA candidate. Taken to IR.  Stroke:  Multifocal left brain infarcts s/p IR w/ TICI3 revascularization of occluded L PROXIMAL ICA, L MCA and L ACA - embolic secondary to unknown source  CT head No acute abnormality. Small vessel disease. Old R cerebellar infarct.   CTA head & neck, CT perfusion -  occlusion L ICA at bulb.  Low density L parietal-occipital lobe with watershed pattern.  ET tube L mainstem bronchus.  Dependent pulmonary infiltrate and hazy opacity otherwise.  Cerebral angiogram occluded L ICA intra and extra cranially - revascularized L ICA, L MCA and L ASA w/ embotrap, solitaire->TICI3  Post IR CT no ICH. ASPECTS 10  MRI multifocal L brain ischemia, mainly in the ACA territory.  Chronic small vessel disease.  Multiple chronic hypertensive micro bleeds.  R cerebellar and L occipital infarcts.  2D Echo EF 60-65%. No source of embolus   EEG no sz  TEE no SOE, LA dilated, neg bubble  Plan loop placement once extubated and closer to d/c  LDL 77  HgbA1c 5.7  HIV  negative  SARS coronavirus 2 neg  Heparin 5000 units sq tid for VTE prophylaxis  No antithrombotic prior to admission, now on aspirin 81 mg daily (300 supp if no tube)  Therapy recommendations:  pending   Disposition:  pending   Acute hypercarbic and hypoxemic respiratory failure in setting of right hemapheresis and hypertensive emergency   History of intermittent asthma   intubated in ED  BNP 2574.5  Weaning  CCM on board   Hypertensive emergency  Hypertension  BP on arrival 269/193  Home meds:  No listed BP meds on MAR  Now off Cleviprex   SBP goal now < 180 . Long-term BP goal normotensive  Hyperlipidemia  Home meds:  lipitor 20, resumed in hospital  LDL 77, goal < 70  Continue statin at discharge  Dysphagia . Secondary to stroke and intubation . NPO . Speech will follow up post extubation   Other Stroke Risk Factors  Morbid obesity, Body mass index is 43 kg/m., recommend weight loss, diet and exercise as appropriate   Other Active Problems  Asthma  Leukocytosis, resolved WBC 8.6->12.9->10.0->8.5->7.6  Acute blood loss anemia post IR Hb 13.1->10.0->9.5->9.2->8.4->8.6  Hypokalemia 3.4->3.6->3.8  ARF Cr 1.39->1.51->1.12->0.93  Prolonged QT  Episodes of bradycardia  Cardiology recommendations - Recommend extended monitoring for atrial fibrillation, strongly consider implantable loop recorder if atrial fibrillation is not recorded during the current admission.  Internal Medicine following, appreciate CCM   Remains intubated  Aspiration pneumonia - IV Rocephin - started 01/03/19 for 7 day course.  Hospital day # 5  This patient is critically ill and at significant risk of neurological worsening, death and care requires constant monitoring of vital signs, hemodynamics,respiratory and cardiac monitoring,review of multiple databases, neurological assessment, discussion with family, other specialists and medical decision making of high  complexity. She is improving, opening her eyes todaty, I discussed with daughter, still unknown etiology of embolus.  I spent 30 minutes of neurocritical care time in the care of this patient.        To contact Stroke Continuity provider, please refer to [email or web address] After hours, contact General Neurology

## 2019-01-06 DIAGNOSIS — R1312 Dysphagia, oropharyngeal phase: Secondary | ICD-10-CM

## 2019-01-06 DIAGNOSIS — I161 Hypertensive emergency: Secondary | ICD-10-CM

## 2019-01-06 DIAGNOSIS — I6340 Cerebral infarction due to embolism of unspecified cerebral artery: Secondary | ICD-10-CM

## 2019-01-06 DIAGNOSIS — D62 Acute posthemorrhagic anemia: Secondary | ICD-10-CM

## 2019-01-06 DIAGNOSIS — N179 Acute kidney failure, unspecified: Secondary | ICD-10-CM

## 2019-01-06 LAB — GLUCOSE, CAPILLARY
Glucose-Capillary: 95 mg/dL (ref 70–99)
Glucose-Capillary: 108 mg/dL — ABNORMAL HIGH (ref 70–99)
Glucose-Capillary: 104 mg/dL — ABNORMAL HIGH (ref 70–99)
Glucose-Capillary: 104 mg/dL — ABNORMAL HIGH (ref 70–99)
Glucose-Capillary: 117 mg/dL — ABNORMAL HIGH (ref 70–99)
Glucose-Capillary: 115 mg/dL — ABNORMAL HIGH (ref 70–99)

## 2019-01-06 LAB — MAGNESIUM: Magnesium: 2.3 mg/dL (ref 1.7–2.4)

## 2019-01-06 LAB — CBC
HCT: 30.5 % — ABNORMAL LOW (ref 36.0–46.0)
Hemoglobin: 8.9 g/dL — ABNORMAL LOW (ref 12.0–15.0)
MCH: 24.1 pg — ABNORMAL LOW (ref 26.0–34.0)
MCHC: 29.2 g/dL — ABNORMAL LOW (ref 30.0–36.0)
MCV: 82.7 fL (ref 80.0–100.0)
Platelets: 353 10*3/uL (ref 150–400)
RBC: 3.69 MIL/uL — ABNORMAL LOW (ref 3.87–5.11)
RDW: 18.3 % — ABNORMAL HIGH (ref 11.5–15.5)
WBC: 7.2 10*3/uL (ref 4.0–10.5)
nRBC: 0 % (ref 0.0–0.2)

## 2019-01-06 LAB — BASIC METABOLIC PANEL
Anion gap: 9 (ref 5–15)
BUN: 35 mg/dL — ABNORMAL HIGH (ref 8–23)
CO2: 29 mmol/L (ref 22–32)
Calcium: 9.5 mg/dL (ref 8.9–10.3)
Chloride: 108 mmol/L (ref 98–111)
Creatinine, Ser: 0.98 mg/dL (ref 0.44–1.00)
GFR calc Af Amer: >60 mL/min (ref >60)
GFR calc non Af Amer: >60 mL/min (ref >60)
Glucose, Bld: 123 mg/dL — ABNORMAL HIGH (ref 70–99)
Potassium: 3.6 mmol/L (ref 3.5–5.1)
Sodium: 146 mmol/L — ABNORMAL HIGH (ref 135–145)

## 2019-01-06 MED ORDER — AMLODIPINE BESYLATE 5 MG PO TABS
5.0000 mg | ORAL_TABLET | Freq: Every day | ORAL | Status: DC
  Administered 2019-01-06: 5 mg
  Filled 2019-01-06: qty 1

## 2019-01-06 MED ORDER — POTASSIUM CHLORIDE 20 MEQ/15ML (10%) PO SOLN
40.0000 meq | Freq: Once | ORAL | Status: AC
  Administered 2019-01-06: 40 meq
  Filled 2019-01-06: qty 30

## 2019-01-06 MED ORDER — FUROSEMIDE 10 MG/ML IJ SOLN
40.0000 mg | Freq: Once | INTRAMUSCULAR | Status: AC
  Administered 2019-01-06: 40 mg via INTRAVENOUS
  Filled 2019-01-06: qty 4

## 2019-01-06 NOTE — Progress Notes (Signed)
NAME:  Alejandra Vega, MRN:  [id number], DOB:  1956/04/14, LOS: 6 ADMISSION DATE:  01/12/2019, CONSULTATION DATE:  01/03/2019 REFERRING MD:  Dr. Laverta [place], ER, CHIEF COMPLAINT:  Altered mental status   Brief History   63 yo female presented with AMS, Rt sided weakness, BP 269/193.  Required intubation for airway protection.  Past Medical History  Asthma, HTN, PUD, Anemia, Anxiety  Significant Hospital Events   8/04 Admit, thrombectomy to Lt ICA  Consults:  Neurology  Procedures:  ETT 8/04 >>   Significant Diagnostic Tests:  CT head 8/04 >> chronic ischemic changes, old Rt cerebellar infarct MRI brain 8/05 >> multifocal acute ischemia in Lt hemisphere, old Rt cerebellar and Lt occipital infarcts TEE 8/06 >> EF 60 to 65%, no thrombus, severe LVH Renal u/s 8/07 >> no hydronephrosis  Micro Data:  COVID 8/04 >> negative Sputum 8/04 >> oral flora Blood 8/04 >> negative Sputum 8/07 >> Enterobacter resistant to ancef  Antimicrobials:  Rocephin 8/07 >>   Interim history/subjective:  Pressure support.  Got one dose of fentanyl overnight.  Objective   Blood pressure (!) 155/71, pulse (!) 57, temperature 99.5 F (37.5 C), temperature source Axillary, resp. rate (!) 24, height 5\' 5"  (1.651 m), weight 116.6 kg, SpO2 100 %.    Vent Mode: PRVC FiO2 (%):  [30 %] 30 % Set Rate:  [24 bmp] 24 bmp Vt Set:  [380 mL] 380 mL PEEP:  [5 cmH20] 5 cmH20 Pressure Support:  [5 cmH20] 5 cmH20 Plateau Pressure:  [15 cmH20-19 cmH20] 19 cmH20   Intake/Output Summary (Last 24 hours) at 01/06/2019 3354 Last data filed at 01/06/2019 0800 Gross per 24 hour  Intake 884.34 ml  Output 2400 ml  Net -1515.66 ml   Filed Weights   01/04/19 0500 01/05/19 0500 01/06/19 0500  Weight: 119.4 kg 117.2 kg 116.6 kg    Examination:  General - somnolent Eyes - pupils reactive ENT - ETT in place, protuberant tongue Cardiac - regular rate/rhythm, no murmur Chest - equal breath sounds b/l, no wheezing or rales  Abdomen - soft, non tender, + bowel sounds Extremities - no cyanosis, clubbing, or edema Skin - no rashes Neuro - opens eyes with stimulation, not following commands   Resolved Hospital Problem list     Assessment & Plan:   Acute hypoxic, hypercapnic respiratory failure from compromised airway. Hx of asthma. - cycle on pressure support - mental status and tongue swelling barrier to extubation trial - I suspect she will need trach to assist with liberation from vent if family wishes to continue aggressive care - f/u CXR intermittently - lasix 40 mg IV x one 8/10 - scheduled BDs  Aspiration pneumonia with Enterobacter in sputum. - day 4 of rocephin  Multifocal CVA s/p thrombectomy to Lt ICA. - continue ASA  HTN emergency. HLD. - goal SBP < 180 - add norvasc 5 mg daily - continue lipitor  Anemia of critical illness. - f/u CBC intermittently - transfuse for Hb < 7 or significant bleeding  Hyperglycemia. - SSI  Best practice:  Diet: tube feeds DVT prophylaxis: SQ heparin GI prophylaxis: protonix Mobility: bed rest Code Status: full code Disposition: ICU  Labs    CMP Latest Ref Rng & Units 01/05/2019 01/04/2019 01/03/2019  Glucose 70 - 99 mg/dL 122(H) 116(H) 136(H)  BUN 8 - 23 mg/dL 29(H) 29(H) 20  Creatinine 0.44 - 1.00 mg/dL 0.93 1.12(H) 1.19(H)  Sodium 135 - 145 mmol/L 145 142 140  Potassium 3.5 - 5.1 mmol/L  3.8 3.6 3.6  Chloride 98 - 111 mmol/L 110 111 110  CO2 22 - 32 mmol/L 26 22 22   Calcium 8.9 - 10.3 mg/dL 9.2 8.9 9.1  Total Protein 6.5 - 8.1 g/dL - - -  Total Bilirubin 0.3 - 1.2 mg/dL - - -  Alkaline Phos 38 - 126 U/L - - -  AST 15 - 41 U/L - - -  ALT 0 - 44 U/L - - -   CBC Latest Ref Rng & Units 01/05/2019 01/04/2019 01/02/2019  WBC 4.0 - 10.5 K/uL 7.6 8.5 10.0  Hemoglobin 12.0 - 15.0 g/dL 8.6(L) 8.4(L) 9.2(L)  Hematocrit 36.0 - 46.0 % 29.0(L) 27.9(L) 30.3(L)  Platelets 150 - 400 K/uL 326 303 282   ABG    Component Value Date/Time   PHART 7.339 (L)  01/02/2019 0827   PCO2ART 41.2 01/02/2019 0827   PO2ART 78.0 (L) 01/02/2019 0827   HCO3 22.2 01/02/2019 0827   TCO2 23 01/02/2019 0827   ACIDBASEDEF 3.0 (H) 01/02/2019 0827   O2SAT 94.0 01/02/2019 0827   CBG (last 3)  Recent Labs    01/05/19 2316 01/06/19 0316 01/06/19 0745  GLUCAP 115* 95 108*    CC time 31 minutes  Chesley Mires, MD [place] 01/06/2019, 8:07 AM

## 2019-01-06 NOTE — Progress Notes (Signed)
STROKE TEAM PROGRESS NOTE   INTERVAL HISTORY Patient is improving.  Pt RNs are at bedside. Pt still intubated on weaning trial, however not able to extubated due to mental status and airway protection. May need to consider trach, will discuss with family. Still has left gaze, not following commands and right hemiplegia.   Vitals:   01/06/19 0630 01/06/19 0700 01/06/19 0807 01/06/19 0816  BP: (!) 172/77 (!) 155/71 (!) 216/101 (!) 184/81  Pulse: 74 (!) 57  86  Resp: (!) 24 (!) 24  20  Temp:   99.3 F (37.4 C)   TempSrc:   Axillary   SpO2: 100% 100%  100%  Weight:      Height:        CBC:  Recent Labs  Lab 01/03/2019 1054  01/01/19 0445  01/05/19 0905 01/06/19 0902  WBC 8.5   < > 12.9*   < > 7.6 7.2  NEUTROABS 5.0  --  10.5*  --   --   --   HGB 13.1   < > 10.0*   < > 8.6* 8.9*  HCT 43.9   < > 32.3*   < > 29.0* 30.5*  MCV 81.0   < > 80.5   < > 82.4 82.7  PLT 395   < > 292   < > 326 353   < > = values in this interval not displayed.    Basic Metabolic Panel:  Recent Labs  Lab 01/03/19 1831 01/04/19 0526 01/05/19 0905 01/06/19 0902  NA  --  142 145 146*  K  --  3.6 3.8 3.6  CL  --  111 110 108  CO2  --  22 26 29   GLUCOSE  --  116* 122* 123*  BUN  --  29* 29* 35*  CREATININE  --  1.12* 0.93 0.98  CALCIUM  --  8.9 9.2 9.5  MG 2.6* 2.3  --  2.3  PHOS 2.9 2.8  --   --    Lipid Panel:     Component Value Date/Time   CHOL 146 01/01/2019 0823   TRIG 115 01/02/2019 0438   HDL 30 (L) 01/01/2019 0823   CHOLHDL 4.9 01/01/2019 0823   VLDL 39 01/01/2019 0823   LDLCALC 77 01/01/2019 0823   HgbA1c:  Lab Results  Component Value Date   HGBA1C 5.7 (H) 01/06/2019   Urine Drug Screen:     Component Value Date/Time   LABOPIA NONE DETECTED 01/02/2019 2036   COCAINSCRNUR NONE DETECTED 01/16/2019 2036   COCAINSCRNUR Negative 12/29/2018 2036   LABBENZ NONE DETECTED 12/29/2018 2036   AMPHETMU NONE DETECTED 01/10/2019 2036   THCU NONE DETECTED 01/02/2019 2036   LABBARB NONE  DETECTED 01/03/2019 2036   LABBARB Negative 01/26/2019 2036    Alcohol Level No results found for: ETH  IMAGING Ct Head Wo Contrast 01/26/2019 1. No acute intracranial abnormality. 2. 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   Ct Angio Head W Or Wo Contrast Ct Angio Neck W Or Wo Contrast Ct Cerebral Perfusion W Contrast 01/19/2019 Occlusion of the left internal carotid artery in the bulb. Flow in the major left intracranial vessels is demonstrated, consistent with external to internal collateral, anterior communicating and posterior communicating contributions. There is low-density in the left parieto-occipital brain probably representing acute to subacute infarction. No correctable large or medium vessel occlusion is identified presently. Watershed pattern affects the left hemisphere with insult to the deep white matter and the left parieto-occipital  cortex. Endotracheal tube in left mainstem bronchus. Dependent pulmonary infiltrate and hazy opacity elsewhere. Findings could be due to aspiration or community acquired bacterial or viral pneumonia.   Cerebral angiogram 01/23/2019 S/P bilateral common carotid arteriogram followed by complete revascularization of occluded Lt ICA, intracranially and extracranially , and Lt MCA and Lt ACA with x 2 passes with 57mm x 53mm embotrap and x 1 pass with 26mmx 40 mm solitaire FR and x 1 pass with solitaire 51mm x 40 mm X device achieving a TICI 3 revascularization.  Mr Brain Wo Contrast 01/01/2019 1. Multifocal acute ischemia, predominantly within the left hemisphere, with the largest area of infarct located in the left anterior cerebral artery territory. 2. 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 3. 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   2D Echocardiogram  1. The left ventricle has normal systolic function with an ejection fraction of 60-65%. The cavity size  was normal. There is moderate concentric left ventricular hypertrophy. Left ventricular diastolic Doppler parameters are consistent with  pseudonormalization.  2. The right ventricle has normal systolic function. The cavity was normal. There is no increase in right ventricular wall thickness.  3. Left atrial size was mildly dilated.  4. Right atrial size was severely dilated.  5. Aortic valve regurgitation is mild by color flow Doppler. No stenosis of the aortic valve.  6. The aorta is normal in size and structure.  7. The aortic root, ascending aorta and aortic arch are normal in size and structure.  8. There is moderate dilatation of the ascending aorta.  9. The atrial septum is grossly normal.  EEG This study is suggestive of severe diffuse encephalopathy. No seizures or epileptiform discharges were seen throughout the recording.  TEE Normal size and systolic function of the left ventricle, with severe left ventricular hypertrophy. The left atrium is dilated, but there is no thrombus and no spontaneous echo contrast seen. Left atrial appendage velocities are normal. No significant valve abnormalities. Normal aorta. No evidence of intracardiac shunt by saline contrast study.  Renal US 01/03/2019 No significant or acute finding by renal ultrasound. Negative for Hydronephrosis.   PHYSICAL EXAM:  Temp:  [98.4 F (36.9 C)-100.3 F (37.9 C)] 99.3 F (37.4 C) (08/10 0807) Pulse Rate:  [57-87] 58 (08/10 1100) Resp:  [9-24] 24 (08/10 1100) BP: (129-237)/(62-135) 129/70 (08/10 1100) SpO2:  [100 %] 100 % (08/10 1100) FiO2 (%):  [30 %] 30 % (08/10 1039) Weight:  [116.6 kg] 116.6 kg (08/10 0500)  General - Well nourished, well developed, intubated not on sedation.  Ophthalmologic - fundi not visualized due to noncooperation.  Cardiovascular - Regular rate and rhythm.  Neuro - intubated not on sedation, eyes spontaneous open, not following commands. Eyes in left gaze position, not  blinking to visual threat on the right, but blinking to the visual threat on the left, doll's eyes not cross midline, some tracking on the left visual field, PERRL. Corneal reflex present, gag and cough present. Breathing over the vent.  Facial symmetry not able to test due to ET tube.  Tongue protrusion not cooperative. Spontaneous movement of LUE and LLE against gravity. On pain stimulation, RUE 0/5 and RLE 2/5 withdraw. DTR 1+ and no babinski. Sensation, coordination and gait not tested.   ASSESSMENT/PLAN Ms. JORDEN MAHL is a 63 y.o. female with history of hypertension, heart murmur, asthma ID presenting following a fall to the right.  Found to be hypertensive with progressive shortness of breath.  Exam found severe right hemapheresis. Not a tPA candidate. Taken to IR.  Stroke:  Multifocal left brain infarcts  s/p IR w/ TICI3 revascularization of occluded L proximal ICA, L MCA and L ACA - embolic secondary to unknown source  CT head No acute abnormality. Old R cerebellar infarct.   CTA head & neck, CT perfusion -  occlusion L ICA at bulb.  Low density L parietal-occipital lobe with watershed pattern.    IR showed occluded L ICA intra and extra cranially - revascularized L ICA, L MCA and L ASA w/ embotrap, solitaire->TICI3  MRI multifocal L brain ischemia, mainly in the ACA territory.  Chronic small vessel disease.  Multiple chronic hypertensive micro bleeds.  R cerebellar and L occipital infarcts.  2D Echo EF 60-65%. No source of embolus   EEG no sz  TEE no SOE, LA dilated, neg bubble  Will consider Loop placement if pt makes neuro improvement  LDL 77  HgbA1c 5.7  HIV negative  SARS coronavirus 2 neg  Heparin 5000 units sq tid for VTE prophylaxis  No antithrombotic prior to admission, now on aspirin 81 mg daily  Therapy recommendations:  pending   Disposition:  pending   Acute hypercarbic and hypoxemic respiratory failure Hx Asthma  History of intermittent asthma    intubated in ED  BNP 2574.5  Tolerated weaning so far but not able to protect airway  CCM feels she will need a trach to assist   CCM on board  Will discuss with family about potential trach   Aspiration pneumonia w/ Enterobacter  Sputum cx positive enterobacter  IV Rocephin - started 01/03/19 for 7 day course, D#4  CCM on board  Hypertensive emergency  Hypertension  BP on arrival 269/193  Home meds:  No listed BP meds on MAR  off Cleviprex   SBP goal now < 180 . BP variable w/ significant elevations . Started on amlodipine 5 today . Long-term BP goal normotensive  Hyperlipidemia  Home meds:  lipitor 20, resumed in hospital  LDL 77, goal < 70  Continue statin at discharge  Hyperglycemia  No hx diabetes  HgbA1c 5.7  On SSI  CBG monitoring  Dysphagia . Secondary to stroke and intubation . NPO . On tube feeding . Continue IVF @ 51 . Speech will follow up post extubation   Other Stroke Risk Factors  Morbid obesity, Body mass index is 42.78 kg/m., recommend weight loss, diet and exercise as appropriate   Other Active Problems  Asthma  Acute blood loss anemia post IR Hb 13.1->10.0->9.5->9.2->8.4->8.6->8.9  Prolonged QT  Episodes of bradycardia  Hospital day # 6  This patient is critically ill due to stroke with ICA occlusion s/p IR thrombectomy, intubated, aspiration, hypertensive emergency and at significant risk of neurological worsening, death form recurrent stroke, hemorrhagic conversion, seizure, hypertensive encephalopathy. This patient's care requires constant monitoring of vital signs, hemodynamics, respiratory and cardiac monitoring, review of multiple databases, neurological assessment, discussion with family, other specialists and medical decision making of high complexity. I spent 40 minutes of neurocritical care time in the care of this patient.  Alejandra Hawking, MD PhD Stroke Neurology 01/06/2019 12:09 PM  To contact Stroke  Continuity provider, please refer to [email or web address] After hours, contact General Neurology

## 2019-01-07 ENCOUNTER — Inpatient Hospital Stay (HOSPITAL_COMMUNITY): Payer: Medicaid Other

## 2019-01-07 DIAGNOSIS — I63232 Cerebral infarction due to unspecified occlusion or stenosis of left carotid arteries: Secondary | ICD-10-CM

## 2019-01-07 DIAGNOSIS — I10 Essential (primary) hypertension: Secondary | ICD-10-CM

## 2019-01-07 DIAGNOSIS — E870 Hyperosmolality and hypernatremia: Secondary | ICD-10-CM

## 2019-01-07 DIAGNOSIS — E785 Hyperlipidemia, unspecified: Secondary | ICD-10-CM

## 2019-01-07 LAB — BASIC METABOLIC PANEL
Anion gap: 11 (ref 5–15)
BUN: 36 mg/dL — ABNORMAL HIGH (ref 8–23)
CO2: 28 mmol/L (ref 22–32)
Calcium: 9.5 mg/dL (ref 8.9–10.3)
Chloride: 109 mmol/L (ref 98–111)
Creatinine, Ser: 1.02 mg/dL — ABNORMAL HIGH (ref 0.44–1.00)
GFR calc Af Amer: >60 mL/min (ref >60)
GFR calc non Af Amer: 58 mL/min — ABNORMAL LOW (ref >60)
Glucose, Bld: 123 mg/dL — ABNORMAL HIGH (ref 70–99)
Potassium: 3.3 mmol/L — ABNORMAL LOW (ref 3.5–5.1)
Sodium: 148 mmol/L — ABNORMAL HIGH (ref 135–145)

## 2019-01-07 LAB — GLUCOSE, CAPILLARY
Glucose-Capillary: 129 mg/dL — ABNORMAL HIGH (ref 70–99)
Glucose-Capillary: 133 mg/dL — ABNORMAL HIGH (ref 70–99)
Glucose-Capillary: 108 mg/dL — ABNORMAL HIGH (ref 70–99)
Glucose-Capillary: 104 mg/dL — ABNORMAL HIGH (ref 70–99)
Glucose-Capillary: 101 mg/dL — ABNORMAL HIGH (ref 70–99)
Glucose-Capillary: 110 mg/dL — ABNORMAL HIGH (ref 70–99)

## 2019-01-07 LAB — MAGNESIUM: Magnesium: 2.4 mg/dL (ref 1.7–2.4)

## 2019-01-07 MED ORDER — FUROSEMIDE 10 MG/ML PO SOLN
20.0000 mg | Freq: Every day | ORAL | Status: DC
  Administered 2019-01-07 – 2019-01-09 (×3): 20 mg
  Filled 2019-01-07 (×5): qty 2

## 2019-01-07 MED ORDER — ASPIRIN 300 MG RE SUPP: 300.0000 mg | Freq: Every day | RECTAL | Status: DC

## 2019-01-07 MED ORDER — ASPIRIN 81 MG PO CHEW
324.0000 mg | CHEWABLE_TABLET | Freq: Every day | ORAL | Status: DC
  Administered 2019-01-08 – 2019-01-09 (×2): 324 mg
  Filled 2019-01-07 (×2): qty 4

## 2019-01-07 MED ORDER — AMLODIPINE BESYLATE 10 MG PO TABS
10.0000 mg | ORAL_TABLET | Freq: Every day | ORAL | Status: DC
  Administered 2019-01-07 – 2019-01-09 (×3): 10 mg
  Filled 2019-01-07 (×3): qty 1

## 2019-01-07 MED ORDER — FREE WATER
200.0000 mL | Freq: Three times a day (TID) | Status: DC
  Administered 2019-01-07 – 2019-01-09 (×6): 200 mL

## 2019-01-07 NOTE — Progress Notes (Signed)
ETT found at 19 at the lip, readvanced bac k to 21 as previously charted. VS within normal limits. Bilateral BS

## 2019-01-07 NOTE — Progress Notes (Signed)
NAME:  Alejandra Vega, MRN:  [id number], DOB:  1955/09/02, LOS: 7 ADMISSION DATE:  12/28/2018, CONSULTATION DATE:  01/21/2019 REFERRING MD:  Dr. Laverta [place], ER, CHIEF COMPLAINT:  Altered mental status   Brief History   63 yo female presented with AMS, Rt sided weakness, BP 269/193.  Required intubation for airway protection.  Past Medical History  Asthma, HTN, PUD, Anemia, Anxiety  Significant Hospital Events   8/04 Admit, thrombectomy to Lt ICA  Consults:  Neurology  Procedures:  ETT 8/04 >>   Significant Diagnostic Tests:  CT head 8/04 >> chronic ischemic changes, old Rt cerebellar infarct MRI brain 8/05 >> multifocal acute ischemia in Lt hemisphere, old Rt cerebellar and Lt occipital infarcts TEE 8/06 >> EF 60 to 65%, no thrombus, severe LVH Renal u/s 8/07 >> no hydronephrosis  Micro Data:  COVID 8/04 >> negative Sputum 8/04 >> oral flora Blood 8/04 >> negative Sputum 8/07 >> Enterobacter resistant to ancef  Antimicrobials:  Rocephin 8/07 >>   Interim history/subjective:  Remains on vent.  Objective   Blood pressure (!) 171/81, pulse (!) 55, temperature 100 F (37.8 C), temperature source Axillary, resp. rate (!) 24, height 5\' 5"  (1.651 m), weight 115 kg, SpO2 97 %.    Vent Mode: PRVC FiO2 (%):  [30 %] 30 % Set Rate:  [16 bmp-24 bmp] 16 bmp Vt Set:  [450 mL] 450 mL PEEP:  [5 cmH20] 5 cmH20 Pressure Support:  [10 cmH20] 10 cmH20 Plateau Pressure:  [19 cmH20-26 cmH20] 26 cmH20   Intake/Output Summary (Last 24 hours) at 01/07/2019 8469 Last data filed at 01/07/2019 0700 Gross per 24 hour  Intake 2611.87 ml  Output 1700 ml  Net 911.87 ml   Filed Weights   01/05/19 0500 01/06/19 0500 01/07/19 0500  Weight: 117.2 kg 116.6 kg 115 kg    Examination:  General - more alert, but still not following commands Eyes - pupils reactive ENT - ETT in place Cardiac - regular rate/rhythm, no murmur Chest - equal breath sounds b/l, no wheezing or rales Abdomen - soft, non  tender, + bowel sounds Extremities - no cyanosis, clubbing, or edema Skin - no rashes Neuro - weak on Rt side  CXR (reviewed by me) - CM, interstitial edema, basilar ATX   Resolved Hospital Problem list     Assessment & Plan:   Acute hypoxic, hypercapnic respiratory failure from compromised airway. Hx of asthma. - pressure support as able - mental status and tongue swelling barrier to extubation - likely will need trach - f/u CXR intermittently - scheduled BDs  Aspiration pneumonia with Enterobacter in sputum. 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 - continue ASA  HTN emergency. HLD. - goal SBP < 180 - continue norvasc, lipitor - add lasix 20 mg daily  Anemia of critical illness. - f/u CBC intermittently - transfuse for Hb < 7 or significant bleeding  Hyperglycemia. - SSI  Best practice:  Diet: tube feeds DVT prophylaxis: SQ heparin GI prophylaxis: protonix Mobility: bed rest Code Status: full code Disposition: ICU  Labs    CMP Latest Ref Rng & Units 01/07/2019 01/06/2019 01/05/2019  Glucose 70 - 99 mg/dL 123(H) 123(H) 122(H)  BUN 8 - 23 mg/dL 36(H) 35(H) 29(H)  Creatinine 0.44 - 1.00 mg/dL 1.02(H) 0.98 0.93  Sodium 135 - 145 mmol/L 148(H) 146(H) 145  Potassium 3.5 - 5.1 mmol/L 3.3(L) 3.6 3.8  Chloride 98 - 111 mmol/L 109 108 110  CO2 22 - 32  mmol/L 28 29 26   Calcium 8.9 - 10.3 mg/dL 9.5 9.5 9.2  Total Protein 6.5 - 8.1 g/dL - - -  Total Bilirubin 0.3 - 1.2 mg/dL - - -  Alkaline Phos 38 - 126 U/L - - -  AST 15 - 41 U/L - - -  ALT 0 - 44 U/L - - -   CBC Latest Ref Rng & Units 01/06/2019 01/05/2019 01/04/2019  WBC 4.0 - 10.5 K/uL 7.2 7.6 8.5  Hemoglobin 12.0 - 15.0 g/dL 8.9(L) 8.6(L) 8.4(L)  Hematocrit 36.0 - 46.0 % 30.5(L) 29.0(L) 27.9(L)  Platelets 150 - 400 K/uL 353 326 303   ABG    Component Value Date/Time   PHART 7.339 (L) 01/02/2019 0827   PCO2ART 41.2 01/02/2019 0827   PO2ART 78.0 (L) 01/02/2019 0827   HCO3 22.2  01/02/2019 0827   TCO2 23 01/02/2019 0827   ACIDBASEDEF 3.0 (H) 01/02/2019 0827   O2SAT 94.0 01/02/2019 0827   CBG (last 3)  Recent Labs    01/06/19 2309 01/07/19 0321 01/07/19 0811  GLUCAP 115* 129* 133*   CC time 32 minutes  Chesley Mires, MD [place] 01/07/2019, 9:23 AM

## 2019-01-07 NOTE — Progress Notes (Signed)
Nutrition Follow-up  DOCUMENTATION CODES:   Morbid obesity  INTERVENTION:  Continue: Vital High Protein @ 50 ml/hr via OG tube 30 ml Prostat TID  Provides: 1500 kcal, 150 grams protein, 1003 ml free water.    NUTRITION DIAGNOSIS:   Inadequate oral intake related to inability to eat as evidenced by NPO status. Ongoing.   GOAL:   Provide needs based on ASPEN/SCCM guidelines Met.   MONITOR:   TF tolerance, Vent status  REASON FOR ASSESSMENT:   Consult, Ventilator Enteral/tube feeding initiation and management  ASSESSMENT:   Pt with PMH of asthma, obesity, HTN, who was admitted with L ACA infarct s/p IR revascularization.   Per MD severe right hemiparesis. Mental status precludes extubation.   Patient is currently intubated on ventilator support Temp (24hrs), Avg:99.5 F (37.5 C), Min:98.6 F (37 C), Max:100.5 F (38.1 C)  Medications reviewed  200 ml free water every 8 hours = 600 ml Labs reviewed    NUTRITION - FOCUSED PHYSICAL EXAM:  Deferred   Diet Order:   Diet Order            Diet NPO time specified  Diet effective now              EDUCATION NEEDS:   No education needs have been identified at this time  Skin:  Skin Assessment: Reviewed RN Assessment  Last BM:  8/11 x 3  Height:   Ht Readings from Last 1 Encounters:  01/12/2019 '5\' 5"'$  (1.651 m)    Weight:   Wt Readings from Last 1 Encounters:  01/07/19 115 kg    Ideal Body Weight:  56.8 kg  BMI:  Body mass index is 42.19 kg/m.  Estimated Nutritional Needs:   Kcal:  9090-3014  Protein:  142 grams  Fluid:  >1.5L/day  [place], [place], [place] Pager [phone] After Hours Pager

## 2019-01-07 NOTE — Progress Notes (Signed)
STROKE TEAM PROGRESS NOTE   INTERVAL HISTORY Patient is improving.  Pt RN at bedside. Pt still intubated, awake with eyes open, still has left gaze preference and not following commands and right hemiplegia. Discussed with daughter about trach and PEG but daughter stated that pt would not want trach and PEG but she is going to discuss with other family members.    Vitals:   01/07/19 0700 01/07/19 0711 01/07/19 0714 01/07/19 0800  BP: (!) 171/81 (!) 171/81    Pulse: 63 (!) 55    Resp: (!) 21 (!) 24    Temp:    100 F (37.8 C)  TempSrc:    Axillary  SpO2: 100% 100% 97%   Weight:      Height:        CBC:  Recent Labs  Lab 01/27/2019 1054  01/01/19 0445  01/05/19 0905 01/06/19 0902  WBC 8.5   < > 12.9*   < > 7.6 7.2  NEUTROABS 5.0  --  10.5*  --   --   --   HGB 13.1   < > 10.0*   < > 8.6* 8.9*  HCT 43.9   < > 32.3*   < > 29.0* 30.5*  MCV 81.0   < > 80.5   < > 82.4 82.7  PLT 395   < > 292   < > 326 353   < > = values in this interval not displayed.    Basic Metabolic Panel:  Recent Labs  Lab 01/03/19 1831 01/04/19 0526  01/06/19 0902 01/07/19 0150  NA  --  142   < > 146* 148*  K  --  3.6   < > 3.6 3.3*  CL  --  111   < > 108 109  CO2  --  22   < > 29 28  GLUCOSE  --  116*   < > 123* 123*  BUN  --  29*   < > 35* 36*  CREATININE  --  1.12*   < > 0.98 1.02*  CALCIUM  --  8.9   < > 9.5 9.5  MG 2.6* 2.3  --  2.3 2.4  PHOS 2.9 2.8  --   --   --    < > = values in this interval not displayed.   Lipid Panel:     Component Value Date/Time   CHOL 146 01/01/2019 0823   TRIG 115 01/02/2019 0438   HDL 30 (L) 01/01/2019 0823   CHOLHDL 4.9 01/01/2019 0823   VLDL 39 01/01/2019 0823   LDLCALC 77 01/01/2019 0823   HgbA1c:  Lab Results  Component Value Date   HGBA1C 5.7 (H) 01/03/2019   Urine Drug Screen:     Component Value Date/Time   LABOPIA NONE DETECTED 01/09/2019 2036   COCAINSCRNUR NONE DETECTED 01/12/2019 2036   COCAINSCRNUR Negative 01/05/2019 2036   LABBENZ  NONE DETECTED 12/30/2018 2036   AMPHETMU NONE DETECTED 01/17/2019 2036   THCU NONE DETECTED 01/26/2019 2036   LABBARB NONE DETECTED 12/29/2018 2036   LABBARB Negative 01/06/2019 2036    Alcohol Level No results found for: ETH  IMAGING Ct Head Wo Contrast 01/02/2019 1. No acute intracranial abnormality. 2. 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 Flow in the major left intracranial  vessels is demonstrated, consistent with external to internal collateral, anterior communicating and posterior communicating contributions. There is low-density in the left parieto-occipital brain probably representing acute to subacute infarction. No correctable large or medium vessel occlusion is identified presently. Watershed pattern affects the left hemisphere with insult to the deep white matter and the left parieto-occipital cortex. Endotracheal tube in left mainstem bronchus. Dependent pulmonary infiltrate and hazy opacity elsewhere. Findings could be due to aspiration or community acquired bacterial or viral pneumonia.   Cerebral angiogram 01/27/2019 S/P bilateral common carotid arteriogram followed by complete revascularization of occluded Lt ICA, intracranially and extracranially , and Lt MCA and Lt ACA with x 2 passes with 64mm x 4mm embotrap and x 1 pass with 72mmx 40 mm solitaire FR and x 1 pass with solitaire 45mm x 40 mm X device achieving a TICI 3 revascularization.  Mr Brain Wo Contrast 01/01/2019 1. Multifocal acute ischemia, predominantly within the left hemisphere, with the largest area of infarct located in the left anterior cerebral artery territory. 2. 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 3. 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    2D Echocardiogram  1. The left ventricle has normal systolic function with an ejection fraction of 60-65%. The cavity size was normal. There is moderate concentric left ventricular hypertrophy. Left ventricular diastolic Doppler parameters are consistent with  pseudonormalization.  2. The right ventricle has normal systolic function. The cavity was normal. There is no increase in right ventricular wall thickness.  3. Left atrial size was mildly dilated.  4. Right atrial size was severely dilated.  5. Aortic valve regurgitation is mild by color flow Doppler. No stenosis of the aortic valve.  6. The aorta is normal in size and structure.  7. The aortic root, ascending aorta and aortic arch are normal in size and structure.  8. There is moderate dilatation of the ascending aorta.  9. The atrial septum is grossly normal.  EEG This study is suggestive of severe diffuse encephalopathy. No seizures or epileptiform discharges were seen throughout the recording.  TEE Normal size and systolic function of the left ventricle, with severe left ventricular hypertrophy. The left atrium is dilated, but there is no thrombus and no spontaneous echo contrast seen. Left atrial appendage velocities are normal. No significant valve abnormalities. Normal aorta. No evidence of intracardiac shunt by saline contrast study.  Renal US 01/03/2019 No significant or acute finding by renal ultrasound. Negative for Hydronephrosis.   PHYSICAL EXAM:  Temp:  [98.6 F (37 C)-100.5 F (38.1 C)] 100 F (37.8 C) (08/11 0800) Pulse Rate:  [55-85] 55 (08/11 0711) Resp:  [21-27] 24 (08/11 0711) BP: (129-210)/(62-135) 171/81 (08/11 0711) SpO2:  [97 %-100 %] 97 % (08/11 0714) FiO2 (%):  [30 %] 30 % (08/11 0735) Weight:  [657 kg] 115 kg (08/11 0500)  General - Well nourished, well developed, intubated not on sedation.  Ophthalmologic - fundi not visualized due to noncooperation.  Cardiovascular - Regular rate and  rhythm.  Neuro - intubated not on sedation, eyes open, not following commands. Eyes in left gaze position, not blinking to visual threat on the right, but blinking to the visual threat on the left, tracking on the left, PERRL. Corneal reflex present, gag and cough present. Breathing over the vent.  Facial symmetry not able to test due to ET tube.  Tongue is swollen and protruded outside mouth. Spontaneous movement of LUE and LLE against gravity. On pain stimulation, RUE 0/5 and RLE  2/5 withdraw. DTR 1+ and right positive babinski. Sensation, coordination and gait not tested.   ASSESSMENT/PLAN Ms. CHRISTEL BAI is a 63 y.o. female with history of hypertension, heart murmur, asthma ID presenting following a fall to the right.  Found to be hypertensive with progressive shortness of breath.  Exam found severe right hemapheresis. Not a tPA candidate. Taken to IR.  Stroke:  Multifocal left brain infarcts  s/p IR w/ TICI3 revascularization of occluded L proximal ICA, L MCA and L ACA - embolic secondary to unknown source  CT head No acute abnormality. Old R cerebellar infarct.   CTA head & neck, CT perfusion -  occlusion L ICA at bulb.  Low density L parietal-occipital lobe with watershed pattern.    IR showed occluded L ICA intra and extra cranially - revascularized L ICA, L MCA and L ASA w/ embotrap, solitaire->TICI3  MRI multifocal L brain ischemia, mainly in the ACA territory.  Chronic small vessel disease.  Multiple chronic hypertensive micro bleeds.  R cerebellar and L occipital infarcts.  2D Echo EF 60-65%. No source of embolus   EEG no sz  TEE no SOE, LA dilated, neg bubble  Will consider Loop placement if pt makes neuro improvement  LDL 77  HgbA1c 5.7  HIV negative  SARS coronavirus 2 neg  Heparin 5000 units sq tid for VTE prophylaxis  No antithrombotic prior to admission, now on aspirin 324 mg daily. Avoid DAPT for now given potential trach and PEG.   Therapy  recommendations:  pending   Disposition:  pending   Acute hypercarbic and hypoxemic respiratory failure Hx Asthma  History of intermittent asthma   intubated in ED  BNP 2574.5  Tolerated weaning so far but not able to protect airway with tongue swollen  CCM feels she will need a trach to assist weaning from vent  CCM on board  Daughter stated that pt would likely not want trach or PEG but she is going to discuss with family.    Aspiration pneumonia w/ Enterobacter  Sputum cx positive enterobacter  IV Rocephin - started 01/03/19 for 7 day course, D#5  CCM on board  Hypertensive emergency  Hypertension  BP on arrival 269/193  Home meds:  No listed BP meds on MAR  off Cleviprex   SBP goal now < 180 . BP remains on the hight side  . Increased amlodipine to 10 today . Long-term BP goal normotensive  Hyperlipidemia  Home meds:  lipitor 20, resumed in hospital  LDL 77, goal < 70  Continue statin at discharge  Hyperglycemia  No hx diabetes  HgbA1c 5.7  On SSI  CBG monitoring  Dysphagia . Secondary to stroke and intubation . NPO . On tube feeding @ 50 . Speech will follow up post extubation   Other Stroke Risk Factors  Morbid obesity, Body mass index is 42.19 kg/m., recommend weight loss, diet and exercise as appropriate   Other Active Problems  Asthma  Acute blood loss anemia post IR Hb 13.1->10.0->9.5->9.2->8.4->8.6->8.9  Prolonged QT  Hypernatremia Na 148 -> CCM on board considering lasix and free water  Loose stool -> laxative discontinued, will continue to monitor, likely related to tube feeding  Hospital day # 7  This patient is critically ill due to stroke with ICA occlusion s/p IR thrombectomy, intubated, aspiration, hypertensive emergency and at significant risk of neurological worsening, death form recurrent stroke, hemorrhagic conversion, seizure, hypertensive encephalopathy. This patient's care requires constant monitoring of  vital signs, hemodynamics, respiratory  and cardiac monitoring, review of multiple databases, neurological assessment, discussion with family, other specialists and medical decision making of high complexity. I spent 35 minutes of neurocritical care time in the care of this patient. I had long discussion with daughter over the phone, updated pt current condition, treatment plan and potential prognosis. She expressed understanding and appreciation. She is going to discuss with other family regarding pt potential trach and PEG.   Rosalin Hawking, MD PhD Stroke Neurology 01/07/2019 9:12 AM  To contact Stroke Continuity provider, please refer to [email or web address] After hours, contact General Neurology

## 2019-01-08 DIAGNOSIS — I6602 Occlusion and stenosis of left middle cerebral artery: Secondary | ICD-10-CM

## 2019-01-08 DIAGNOSIS — J9600 Acute respiratory failure, unspecified whether with hypoxia or hypercapnia: Secondary | ICD-10-CM

## 2019-01-08 DIAGNOSIS — J9622 Acute and chronic respiratory failure with hypercapnia: Secondary | ICD-10-CM

## 2019-01-08 DIAGNOSIS — D72829 Elevated white blood cell count, unspecified: Secondary | ICD-10-CM

## 2019-01-08 LAB — GLUCOSE, CAPILLARY
Glucose-Capillary: 114 mg/dL — ABNORMAL HIGH (ref 70–99)
Glucose-Capillary: 107 mg/dL — ABNORMAL HIGH (ref 70–99)
Glucose-Capillary: 110 mg/dL — ABNORMAL HIGH (ref 70–99)
Glucose-Capillary: 112 mg/dL — ABNORMAL HIGH (ref 70–99)
Glucose-Capillary: 113 mg/dL — ABNORMAL HIGH (ref 70–99)
Glucose-Capillary: 118 mg/dL — ABNORMAL HIGH (ref 70–99)

## 2019-01-08 LAB — BASIC METABOLIC PANEL
Anion gap: 10 (ref 5–15)
BUN: 36 mg/dL — ABNORMAL HIGH (ref 8–23)
CO2: 28 mmol/L (ref 22–32)
Calcium: 9.4 mg/dL (ref 8.9–10.3)
Chloride: 109 mmol/L (ref 98–111)
Creatinine, Ser: 0.96 mg/dL (ref 0.44–1.00)
GFR calc Af Amer: >60 mL/min (ref >60)
GFR calc non Af Amer: >60 mL/min (ref >60)
Glucose, Bld: 123 mg/dL — ABNORMAL HIGH (ref 70–99)
Potassium: 3.2 mmol/L — ABNORMAL LOW (ref 3.5–5.1)
Sodium: 147 mmol/L — ABNORMAL HIGH (ref 135–145)

## 2019-01-08 LAB — CBC
HCT: 30.8 % — ABNORMAL LOW (ref 36.0–46.0)
Hemoglobin: 9 g/dL — ABNORMAL LOW (ref 12.0–15.0)
MCH: 24.2 pg — ABNORMAL LOW (ref 26.0–34.0)
MCHC: 29.2 g/dL — ABNORMAL LOW (ref 30.0–36.0)
MCV: 82.8 fL (ref 80.0–100.0)
Platelets: 317 10*3/uL (ref 150–400)
RBC: 3.72 MIL/uL — ABNORMAL LOW (ref 3.87–5.11)
RDW: 18.2 % — ABNORMAL HIGH (ref 11.5–15.5)
WBC: 12.9 10*3/uL — ABNORMAL HIGH (ref 4.0–10.5)
nRBC: 0 % (ref 0.0–0.2)

## 2019-01-08 MED ORDER — POTASSIUM CHLORIDE 20 MEQ/15ML (10%) PO SOLN
40.0000 meq | Freq: Every day | ORAL | Status: DC
  Administered 2019-01-08 – 2019-01-09 (×2): 40 meq
  Filled 2019-01-08 (×2): qty 30

## 2019-01-08 MED ORDER — POTASSIUM CHLORIDE 20 MEQ/15ML (10%) PO SOLN: 40.0000 meq | Freq: Once | ORAL | Status: DC

## 2019-01-08 MED ORDER — LOSARTAN POTASSIUM 50 MG PO TABS
50.0000 mg | ORAL_TABLET | Freq: Every day | ORAL | Status: DC
  Administered 2019-01-08: 50 mg via ORAL
  Filled 2019-01-08: qty 1

## 2019-01-08 NOTE — Progress Notes (Signed)
STROKE TEAM PROGRESS NOTE   INTERVAL HISTORY Patient is improving.  Pt still intubated, awake with eyes open, not following commands, no significant neuro change from yesterday. Vitals stable.  Na 147 but WBC up to 12.9. still waiting for family decision on trach.    Vitals:   01/08/19 0730 01/08/19 0737 01/08/19 0800 01/08/19 0900  BP: (!) 203/90  (!) 165/78 (!) 150/63  Pulse: 76  74 74  Resp: (!) 21  18 18   Temp:   98.7 F (37.1 C)   TempSrc:   Axillary   SpO2: 100% 99% 98% 98%  Weight:      Height:        CBC:  Recent Labs  Lab 01/06/19 0902 01/08/19 0205  WBC 7.2 12.9*  HGB 8.9* 9.0*  HCT 30.5* 30.8*  MCV 82.7 82.8  PLT 353 413    Basic Metabolic Panel:  Recent Labs  Lab 01/03/19 1831 01/04/19 0526  01/06/19 0902 01/07/19 0150 01/08/19 0205  NA  --  142   < > 146* 148* 147*  K  --  3.6   < > 3.6 3.3* 3.2*  CL  --  111   < > 108 109 109  CO2  --  22   < > 29 28 28   GLUCOSE  --  116*   < > 123* 123* 123*  BUN  --  29*   < > 35* 36* 36*  CREATININE  --  1.12*   < > 0.98 1.02* 0.96  CALCIUM  --  8.9   < > 9.5 9.5 9.4  MG 2.6* 2.3  --  2.3 2.4  --   PHOS 2.9 2.8  --   --   --   --    < > = values in this interval not displayed.   Lipid Panel:     Component Value Date/Time   CHOL 146 01/01/2019 0823   TRIG 115 01/02/2019 0438   HDL 30 (L) 01/01/2019 0823   CHOLHDL 4.9 01/01/2019 0823   VLDL 39 01/01/2019 0823   LDLCALC 77 01/01/2019 0823   HgbA1c:  Lab Results  Component Value Date   HGBA1C 5.7 (H) 01/04/2019   Urine Drug Screen:     Component Value Date/Time   LABOPIA NONE DETECTED 01/03/2019 2036   COCAINSCRNUR NONE DETECTED 01/19/2019 2036   COCAINSCRNUR Negative 01/03/2019 2036   LABBENZ NONE DETECTED 01/17/2019 2036   AMPHETMU NONE DETECTED 01/16/2019 2036   THCU NONE DETECTED 01/18/2019 2036   LABBARB NONE DETECTED 01/01/2019 2036   LABBARB Negative 01/03/2019 2036    Alcohol Level No results found for: ETH  IMAGING Ct Head Wo  Contrast 01/10/2019 1. No acute intracranial abnormality. 2. 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   Ct Angio Head W Or Wo Contrast Ct Angio Neck W Or Wo Contrast Ct Cerebral Perfusion W Contrast 01/10/2019 Occlusion of the left internal carotid artery in the bulb. Flow in the major left intracranial vessels is demonstrated, consistent with external to internal collateral, anterior communicating and posterior communicating contributions. There is low-density in the left parieto-occipital brain probably representing acute to subacute infarction. No correctable large or medium vessel occlusion is identified presently. Watershed pattern affects the left hemisphere with insult to the deep white matter and the left parieto-occipital cortex. Endotracheal tube in left mainstem bronchus. Dependent pulmonary infiltrate and hazy opacity elsewhere. Findings could be due to aspiration or community acquired bacterial or viral pneumonia.   Cerebral  angiogram 01/17/2019 S/P bilateral common carotid arteriogram followed by complete revascularization of occluded Lt ICA, intracranially and extracranially , and Lt MCA and Lt ACA with x 2 passes with 79mm x 33mm embotrap and x 1 pass with 23mmx 40 mm solitaire FR and x 1 pass with solitaire 82mm x 40 mm X device achieving a TICI 3 revascularization.  Mr Brain Wo Contrast 01/01/2019 1. Multifocal acute ischemia, predominantly within the left hemisphere, with the largest area of infarct located in the left anterior cerebral artery territory. 2. 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 3. 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   2D Echocardiogram  1. The left ventricle has normal systolic function with an ejection fraction of 60-65%. The cavity size was normal. There is moderate concentric left ventricular hypertrophy. Left ventricular diastolic Doppler parameters are consistent  with  pseudonormalization.  2. The right ventricle has normal systolic function. The cavity was normal. There is no increase in right ventricular wall thickness.  3. Left atrial size was mildly dilated.  4. Right atrial size was severely dilated.  5. Aortic valve regurgitation is mild by color flow Doppler. No stenosis of the aortic valve.  6. The aorta is normal in size and structure.  7. The aortic root, ascending aorta and aortic arch are normal in size and structure.  8. There is moderate dilatation of the ascending aorta.  9. The atrial septum is grossly normal.  EEG This study is suggestive of severe diffuse encephalopathy. No seizures or epileptiform discharges were seen throughout the recording.  TEE Normal size and systolic function of the left ventricle, with severe left ventricular hypertrophy. The left atrium is dilated, but there is no thrombus and no spontaneous echo contrast seen. Left atrial appendage velocities are normal. No significant valve abnormalities. Normal aorta. No evidence of intracardiac shunt by saline contrast study.  Renal US 01/03/2019 No significant or acute finding by renal ultrasound. Negative for Hydronephrosis.   PHYSICAL EXAM:  Temp:  [98.3 F (36.8 C)-99.9 F (37.7 C)] 98.7 F (37.1 C) (08/12 0800) Pulse Rate:  [52-90] 74 (08/12 0900) Resp:  [15-25] 18 (08/12 0900) BP: (138-203)/(63-90) 150/63 (08/12 0900) SpO2:  [96 %-100 %] 98 % (08/12 0900) FiO2 (%):  [30 %] 30 % (08/12 0800) Weight:  [114.1 kg] 114.1 kg (08/12 0500)  General - Well nourished, well developed, intubated not on sedation.  Ophthalmologic - fundi not visualized due to noncooperation.  Cardiovascular - Regular rate and rhythm.  Neuro - intubated not on sedation, eyes open, not following commands. Eyes in left gaze preference, not blinking to visual threat on the right, but blinking to the visual threat on the left, tracking on the left, PERRL. Corneal reflex present,  gag and cough present. Breathing over the vent.  Facial symmetry not able to test due to ET tube.  Tongue is swollen and protruded outside mouth. Spontaneous movement of LUE and LLE against gravity. On pain stimulation, RUE 0/5 and RLE 2/5 withdraw. DTR 1+ and right positive babinski. Sensation, coordination and gait not tested.   ASSESSMENT/PLAN Alejandra Vega is a 63 y.o. female with history of hypertension, heart murmur, asthma ID presenting following a fall to the right.  Found to be hypertensive with progressive shortness of breath.  Exam found severe right hemapheresis. Not a tPA candidate. Taken to IR.  Stroke:  Multifocal left brain infarcts  s/p IR w/ TICI3 revascularization of occluded L proximal ICA, L MCA and L  ACA - embolic secondary to unknown source  CT head No acute abnormality. Old R cerebellar infarct.   CTA head & neck, CT perfusion -  occlusion L ICA at bulb.  Low density L parietal-occipital lobe with watershed pattern.    IR showed occluded L ICA intra and extra cranially - revascularized L ICA, L MCA and L ASA w/ embotrap, solitaire->TICI3  MRI multifocal L brain ischemia, mainly in the ACA territory.  Chronic small vessel disease.  Multiple chronic hypertensive micro bleeds.  R cerebellar and L occipital infarcts.  2D Echo EF 60-65%. No source of embolus   EEG no sz  TEE no SOE, LA dilated, neg bubble  May consider Loop placement if pt makes neuro improvement  LDL 77  HgbA1c 5.7  HIV negative  SARS coronavirus 2 neg  Heparin 5000 units sq tid for VTE prophylaxis  No antithrombotic prior to admission, now on aspirin 324 mg daily. Avoid DAPT for now given potential trach and PEG.   Therapy recommendations:  pending   Disposition:  pending   Acute hypercarbic and hypoxemic respiratory failure Hx Asthma  History of intermittent asthma   intubated in ED  BNP 2574.5  Tolerated weaning so far but not able to protect airway with tongue  swollen  CCM feels she will need a trach to assist weaning from vent  CCM on board  Daughter stated that pt would likely not want trach or PEG but she is going to discuss with family. Still pending family decision at this time.   Aspiration pneumonia w/ Enterobacter  Tmax 100.0  Sputum cx positive enterobacter  IV Rocephin - started 01/03/19 for 7 day course, D#6  CCM on board   Hypertensive emergency  Hypertension  BP on arrival 269/193  Home meds: none listed  off Cleviprex   SBP goal now < 180 . BP remains on the hight side  . Continue amlodipine 10, and add losartan 50 today . Long-term BP goal normotensive  Hyperlipidemia  Home meds:  lipitor 20, resumed in hospital  LDL 77, goal < 70  Continue statin at discharge  Dysphagia . Secondary to stroke and intubation . NPO . On tube feeding @ 50 . Speech on board   Other Stroke Risk Factors  Morbid obesity, Body mass index is 41.86 kg/m., recommend weight loss, diet and exercise as appropriate   Other Active Problems  Asthma  Acute blood loss anemia post IR Hb 13.1->10.0->9.5->8.4->8.9->9.0  Prolonged QT  Hypernatremia Na 148->147 -> on free water  Loose stool -> improved, likely related to tube feeding  Hospital day # 8  This patient is critically ill due to stroke with ICA occlusion s/p IR thrombectomy, intubated, aspiration, hypertensive emergency and at significant risk of neurological worsening, death form recurrent stroke, hemorrhagic conversion, seizure, hypertensive encephalopathy. This patient's care requires constant monitoring of vital signs, hemodynamics, respiratory and cardiac monitoring, review of multiple databases, neurological assessment, discussion with family, other specialists and medical decision making of high complexity. I spent 35 minutes of neurocritical care time in the care of this patient.   Rosalin Hawking, MD PhD Stroke Neurology 01/08/2019 9:54 AM  To contact Stroke  Continuity provider, please refer to [email or web address] After hours, contact General Neurology

## 2019-01-08 NOTE — Progress Notes (Signed)
Pt's ETT was at 19cm. RT advanced ETT to 22cm bilateral breath sounds noted.

## 2019-01-08 NOTE — Progress Notes (Signed)
NAME:  SYDELLE SHERFIELD, MRN:  [id number], DOB:  1955/12/28, LOS: 43 ADMISSION DATE:  01/15/2019, CONSULTATION DATE:  01/23/2019 REFERRING MD:  Dr. Laverta [place], ER, CHIEF COMPLAINT:  Altered mental status   Brief History   63 yo female presented with AMS, Rt sided weakness, BP 269/193.  Required intubation for airway protection.  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 rate 18, height 5\' 5"  (1.651 m), weight 114.1 kg, SpO2 98 %.    Vent Mode: CPAP;PSV FiO2 (%):  [30 %] 30 % Set Rate:  [16 bmp] 16 bmp Vt Set:  [450 mL] 450 mL PEEP:  [5 cmH20] 5 cmH20 Pressure Support:  [5 cmH20] 5 cmH20 Plateau Pressure:  [18 cmH20-23 cmH20] 22 cmH20   Intake/Output Summary (Last 24 hours) at 01/08/2019 0826 Last data filed at 01/08/2019 0800 Gross per 24 hour  Intake 2264.76 ml  Output 1000 ml  Net 1264.76 ml   Filed Weights   01/06/19 0500 01/07/19 0500 01/08/19 0500  Weight: 116.6 kg 115 kg 114.1 kg    Examination:  General - critically ill  appearing older adult F, intubated, sedated NAD  HEENT- NCAT. ETT secure, trachea midline, anicteric sclera  Cardiac - RRR s1s2 no rgm  Chest - Symmetrical chest expansion, no adventitious breath sounds  Abdomen - soft round, ndnt, normoactive  Extremities - No obvious joint deformity, no cyanosis, no clubbing  Skin - clean, dry, warm, without rash  Neuro - Somnolent, opens eyes to voice but is not following commands.     Resolved Hospital Problem list     Assessment & Plan:   Acute hypoxic hypercapnic respiratory failure from compromised airway  -Hx asthma  - WUA/SBT, continue PS as able - mental status ongoing extubation barrier.  - Trach would be a possible consideration from PCCM perspective. In reviewing neuro note 8/10, it is noted that daughter feels patient would NOT want trach/PEG, but will discuss with other family members.  - AM CXR - scheduled BDs  Aspiration PNA with enterobacter  - Continue rocephin for 7 day course   Multifocal CVA s/p thrombectomy to Lt ICA. - continue ASA - CVA care per neuro   Encephalopathy -likely related to CVA - possible component of ICU delirium P -RN delirium precautions bundle -Check AM ammonia - minimize sedation as able   HTN -previously HTN emergency HLD. - SBP goal < 180  - norvasc, 20 mg lasix - PRN hydral, PRN labetalol - lipitor  Anemia, likely of critical illness - AM CBC - transfuse for Hgb < 7  Hyperglycemia - SSI  Hypernatremia, mild Hyperkalemia, mild -Continue Free water for hypernatremia and trend BMP -Replace K+ now, trend on BMP. Anticipate need for ongoing K+ supplementation now that patient is receiving daily lasix   Best practice:  Diet: tube feeds DVT prophylaxis: SQ heparin GI prophylaxis: protonix Mobility: bed rest Code Status: full code Disposition: ICU  Labs    CMP Latest Ref Rng & Units 01/08/2019 01/07/2019 01/06/2019  Glucose 70 - 99 mg/dL 123(H) 123(H) 123(H)  BUN 8 - 23 mg/dL  36(H) 36(H) 35(H)  Creatinine 0.44 - 1.00 mg/dL 0.96 1.02(H) 0.98  Sodium 135 - 145 mmol/L 147(H) 148(H) 146(H)  Potassium 3.5 - 5.1 mmol/L 3.2(L) 3.3(L) 3.6  Chloride 98 - 111 mmol/L 109 109 108  CO2 22 - 32 mmol/L 28 28 29   Calcium 8.9 - 10.3 mg/dL 9.4 9.5 9.5  Total Protein 6.5 - 8.1 g/dL - - -  Total Bilirubin 0.3 - 1.2 mg/dL - - -  Alkaline Phos 38 - 126 U/L - - -  AST 15 - 41 U/L - - -  ALT 0 - 44 U/L - - -   CBC Latest Ref Rng & Units 01/08/2019 01/06/2019 01/05/2019  WBC 4.0 - 10.5 K/uL 12.9(H) 7.2 7.6  Hemoglobin 12.0 - 15.0 g/dL 9.0(L) 8.9(L) 8.6(L)  Hematocrit 36.0 - 46.0 % 30.8(L) 30.5(L) 29.0(L)  Platelets 150 - 400 K/uL 317 353 326   ABG    Component Value Date/Time   PHART 7.339 (L) 01/02/2019 0827   PCO2ART 41.2 01/02/2019 0827   PO2ART 78.0 (L) 01/02/2019 0827   HCO3 22.2 01/02/2019 0827   TCO2 23 01/02/2019 0827   ACIDBASEDEF 3.0 (H) 01/02/2019 0827   O2SAT 94.0 01/02/2019 0827   CBG (last 3)  Recent Labs    01/07/19 2318 01/08/19 0315 01/08/19 0759  GLUCAP 110* 114* 107*   CC time 40 minutes   Eliseo Gum MSN, AGACNP-BC [place] [phone] If no answer, [phone] 01/08/2019, 8:26 AM

## 2019-01-08 NOTE — Progress Notes (Signed)
Assisted tele visit to patient with family member.  Mikhael Hendriks Ann, RN  

## 2019-01-09 ENCOUNTER — Inpatient Hospital Stay (HOSPITAL_COMMUNITY): Payer: Medicaid Other

## 2019-01-09 DIAGNOSIS — R509 Fever, unspecified: Secondary | ICD-10-CM

## 2019-01-09 LAB — CBC
HCT: 31.4 % — ABNORMAL LOW (ref 36.0–46.0)
Hemoglobin: 9.3 g/dL — ABNORMAL LOW (ref 12.0–15.0)
MCH: 24.3 pg — ABNORMAL LOW (ref 26.0–34.0)
MCHC: 29.6 g/dL — ABNORMAL LOW (ref 30.0–36.0)
MCV: 82.2 fL (ref 80.0–100.0)
Platelets: 347 10*3/uL (ref 150–400)
RBC: 3.82 MIL/uL — ABNORMAL LOW (ref 3.87–5.11)
RDW: 18 % — ABNORMAL HIGH (ref 11.5–15.5)
WBC: 14.7 10*3/uL — ABNORMAL HIGH (ref 4.0–10.5)
nRBC: 0.1 % (ref 0.0–0.2)

## 2019-01-09 LAB — BASIC METABOLIC PANEL
Anion gap: 10 (ref 5–15)
BUN: 29 mg/dL — ABNORMAL HIGH (ref 8–23)
CO2: 29 mmol/L (ref 22–32)
Calcium: 9.5 mg/dL (ref 8.9–10.3)
Chloride: 109 mmol/L (ref 98–111)
Creatinine, Ser: 0.85 mg/dL (ref 0.44–1.00)
GFR calc Af Amer: >60 mL/min (ref >60)
GFR calc non Af Amer: >60 mL/min (ref >60)
Glucose, Bld: 129 mg/dL — ABNORMAL HIGH (ref 70–99)
Potassium: 3.1 mmol/L — ABNORMAL LOW (ref 3.5–5.1)
Sodium: 148 mmol/L — ABNORMAL HIGH (ref 135–145)

## 2019-01-09 LAB — GLUCOSE, CAPILLARY
Glucose-Capillary: 109 mg/dL — ABNORMAL HIGH (ref 70–99)
Glucose-Capillary: 114 mg/dL — ABNORMAL HIGH (ref 70–99)
Glucose-Capillary: 110 mg/dL — ABNORMAL HIGH (ref 70–99)

## 2019-01-09 LAB — AMMONIA: Ammonia: 35 umol/L (ref 9–35)

## 2019-01-09 MED ORDER — MORPHINE SULFATE (PF) 2 MG/ML IV SOLN: 1.0000 mg | INTRAVENOUS | Status: DC | PRN

## 2019-01-09 MED ORDER — HALOPERIDOL 0.5 MG PO TABS
0.5000 mg | ORAL_TABLET | ORAL | Status: DC | PRN
  Filled 2019-01-09: qty 1

## 2019-01-09 MED ORDER — POLYVINYL ALCOHOL 1.4 % OP SOLN
1.0000 [drp] | Freq: Four times a day (QID) | OPHTHALMIC | Status: DC | PRN
  Filled 2019-01-09: qty 15

## 2019-01-09 MED ORDER — LOSARTAN POTASSIUM 50 MG PO TABS: 50.0000 mg | ORAL_TABLET | Freq: Two times a day (BID) | ORAL | Status: DC

## 2019-01-09 MED ORDER — DEXTROSE 5 % IV SOLN: INTRAVENOUS | Status: DC

## 2019-01-09 MED ORDER — POTASSIUM CHLORIDE 20 MEQ/15ML (10%) PO SOLN
40.0000 meq | Freq: Once | ORAL | Status: AC
  Administered 2019-01-09: 40 meq
  Filled 2019-01-09: qty 30

## 2019-01-09 MED ORDER — SCOPOLAMINE 1 MG/3DAYS TD PT72
1.0000 | MEDICATED_PATCH | TRANSDERMAL | Status: DC
  Administered 2019-01-09: 1.5 mg via TRANSDERMAL
  Filled 2019-01-09: qty 1

## 2019-01-09 MED ORDER — ACETAMINOPHEN 325 MG PO TABS: 650.0000 mg | ORAL_TABLET | Freq: Four times a day (QID) | ORAL | Status: DC | PRN

## 2019-01-09 MED ORDER — ACETAMINOPHEN 650 MG RE SUPP: 650.0000 mg | Freq: Four times a day (QID) | RECTAL | Status: DC | PRN

## 2019-01-09 MED ORDER — FREE WATER: 200.0000 mL | Freq: Four times a day (QID) | Status: DC

## 2019-01-09 MED ORDER — GLYCOPYRROLATE 1 MG PO TABS
1.0000 mg | ORAL_TABLET | ORAL | Status: DC | PRN
  Filled 2019-01-09: qty 1

## 2019-01-09 MED ORDER — HALOPERIDOL LACTATE 5 MG/ML IJ SOLN
0.5000 mg | INTRAMUSCULAR | Status: DC | PRN
  Administered 2019-01-10: 11:00:00 0.5 mg via INTRAVENOUS
  Filled 2019-01-09: qty 1

## 2019-01-09 MED ORDER — GLYCOPYRROLATE 0.2 MG/ML IJ SOLN
0.2000 mg | INTRAMUSCULAR | Status: DC | PRN
  Administered 2019-01-10 – 2019-01-11 (×3): 0.2 mg via INTRAVENOUS
  Filled 2019-01-09 (×4): qty 1

## 2019-01-09 MED ORDER — ONDANSETRON 4 MG PO TBDP: 4.0000 mg | ORAL_TABLET | Freq: Four times a day (QID) | ORAL | Status: DC | PRN

## 2019-01-09 MED ORDER — BIOTENE DRY MOUTH MT LIQD: 15.0000 mL | OROMUCOSAL | Status: DC | PRN

## 2019-01-09 MED ORDER — HALOPERIDOL LACTATE 2 MG/ML PO CONC
0.5000 mg | ORAL | Status: DC | PRN
  Filled 2019-01-09: qty 0.3

## 2019-01-09 MED ORDER — GLYCOPYRROLATE 0.2 MG/ML IJ SOLN: 0.2000 mg | INTRAMUSCULAR | Status: DC | PRN

## 2019-01-09 MED ORDER — ONDANSETRON HCL 4 MG/2ML IJ SOLN: 4.0000 mg | Freq: Four times a day (QID) | INTRAMUSCULAR | Status: DC | PRN

## 2019-01-09 MED ORDER — MORPHINE 100MG IN NS 100ML (1MG/ML) PREMIX INFUSION
1.0000 mg/h | INTRAVENOUS | Status: DC
  Administered 2019-01-09: 1 mg/h via INTRAVENOUS
  Administered 2019-01-10: 6 mg/h via INTRAVENOUS
  Administered 2019-01-10: 8 mg/h via INTRAVENOUS
  Administered 2019-01-11: 10 mg/h via INTRAVENOUS
  Filled 2019-01-09 (×4): qty 100

## 2019-01-09 NOTE — Plan of Care (Signed)
Had family meeting yesterday with daughter at bedside and pt sister over the phone, answered all their questions. They were discussion about GOC over night. Today I talked with daughter over the phone, she stated that family had made decision for one way extubation and comfort care measure. She understood that if pt tolerating one way extubation, she will continue comfort care measures and potential residential hospice if needed. Daughter expressed understanding and willing to proceed. Will put in comfort care orders and will inform CCM for one way extubation.   Rosalin Hawking, MD PhD Stroke Neurology 01/09/2019 12:18 PM

## 2019-01-09 NOTE — Progress Notes (Signed)
Patient transferred from 4th floor for comfort measures. Patient settled in bed with flexiseal and purewick in place. Patient has morphine drip infusing per orders. Pt resting comfortably in bed with call bell in reach and side rails up. Patient family waiting in hall to see patient. Will report to oncoming nursing staff.

## 2019-01-09 NOTE — Procedures (Signed)
Extubation Procedure Note  Patient Details:   Name: Alejandra Vega DOB: 1956-04-15 MRN: [id number]   Airway Documentation:    Vent end date: 01/09/19 Vent end time: 1350   Evaluation  O2 sats: fell post withdrawal of care Complications: No apparent complications Patient did tolerate procedure well. Bilateral Breath Sounds: Clear   Patient extubated as per family wishes & MD order for withdrawal of care   Kathie Dike 01/09/2019, 2:02 PM

## 2019-01-09 NOTE — Progress Notes (Signed)
Per nurse request I visited with patient to provide support to patient and daughter at bedside.  Daughter is tearful and waiting on other family to arrive. Per daughters request Provided  Listening ,prayer and emotional support..  Family planning to extubate soon.  Chaplain available as needed.   Jaclynn Major, [facility], Pager [phone]

## 2019-01-09 NOTE — Progress Notes (Signed)
NAME:  Alejandra Vega, MRN:  [id number], DOB:  05-Jan-1956, LOS: 15 ADMISSION DATE:  01/19/2019, CONSULTATION DATE:  01/26/2019 REFERRING MD:  Dr. Laverta Baltimore, ER, CHIEF COMPLAINT:  Altered mental status   Brief History   63 yo female presented with AMS, Rt sided weakness, BP 269/193.  Required intubation for airway protection.  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 rate 19, height 5\' 5"  (1.651 m), weight 114.3 kg, SpO2 96 %.    Vent Mode: PRVC FiO2 (%):  [30 %] 30 % Set Rate:  [16 bmp] 16 bmp Vt Set:  [450 mL] 450 mL PEEP:  [5 cmH20] 5 cmH20 Pressure Support:  [5 cmH20] 5 cmH20 Plateau Pressure:  [20 cmH20-26 cmH20] 20 cmH20   Intake/Output Summary (Last 24 hours) at 01/09/2019 7829 Last data filed at 01/09/2019 0600 Gross per 24 hour  Intake 2014.35 ml  Output 850 ml  Net 1164.35 ml   Filed Weights   01/07/19 0500 01/08/19 0500 01/09/19 0500  Weight: 115 kg 114.1 kg 114.3 kg    Examination:  General -obese ill-appearing woman, intubated, no apparent distress HEENT-ET tube in good position, large neck, large  tongue, no scleral edema Cardiac -regular, distant, 80s, no murmur Chest -comfortable respiratory pattern on pressure support, no crackles, no wheezes Abdomen -soft, obese, nondistended, positive bowel sounds Extremities -trace bilateral lower extremity edema Skin -no rash Neuro -opens her eyes quickly to voice, tracks, moved her left hand and foot to command, flaccid on the right   Resolved Hospital Problem list     Assessment & Plan:   Acute hypoxic hypercapnic respiratory failure from compromised airway  -Hx asthma  Continue pressure support ventilation, SBT.  Her airway protection, mental status, tongue size all impact her chances for successful extubation.  Prior to doing so agree that it is important to know her overall goals for care regarding reintubation, tracheostomy, prolonged ventilator support, etc.  Discussions are ongoing with family, being led by neurology.  If in the end family indicates that the patient would not want prolonged support or tracheostomy then we will attempt to optimize her respiratory status and then work for a one-way extubation, hopefully for success. Follow intermittent chest x-ray Schedule BD's as ordered  Aspiration PNA with enterobacter  Day 7 of 7 ceftriaxone on 8/13  Multifocal CVA s/p thrombectomy to Lt ICA. Aspirin as ordered Appreciate neurology management  Encephalopathy -likely related to CVA - possible component of ICU delirium -Ammonia 35 8/13 P Minimize sedating medication as able, currently has fentanyl and Versed pushes available  HTN -previously HTN emergency HLD. Goal SBP < 180 Furosemide, amlodipine, losartan as  ordered Lipitor as ordered  Anemia, likely of critical illness Follow CBC Transfusion goal hemoglobin < 7  Hyperglycemia Slight scale insulin as per protocol  Hypernatremia, mild Hyperkalemia, mild Free water as ordered On maintenance potassium 40 mEq daily Extra dose KCl given on 8/13 Follow BMP   Best practice:  Diet: tube feeds DVT prophylaxis: SQ heparin GI prophylaxis: protonix Mobility: bed rest Code Status: full code Disposition: ICU  Labs    CMP Latest Ref Rng & Units 01/09/2019 01/08/2019 01/07/2019  Glucose 70 - 99 mg/dL 129(H) 123(H) 123(H)  BUN 8 - 23 mg/dL 29(H) 36(H) 36(H)  Creatinine 0.44 - 1.00 mg/dL 0.85 0.96 1.02(H)  Sodium 135 - 145 mmol/L 148(H) 147(H) 148(H)  Potassium 3.5 - 5.1 mmol/L 3.1(L) 3.2(L) 3.3(L)  Chloride 98 - 111 mmol/L 109 109 109  CO2 22 - 32 mmol/L 29 28 28   Calcium 8.9 - 10.3 mg/dL 9.5 9.4 9.5  Total Protein 6.5 - 8.1 g/dL - - -  Total Bilirubin 0.3 - 1.2 mg/dL - - -  Alkaline Phos 38 - 126 U/L - - -  AST 15 - 41 U/L - - -  ALT 0 - 44 U/L - - -   CBC Latest Ref Rng & Units 01/09/2019 01/08/2019 01/06/2019  WBC 4.0 - 10.5 K/uL 14.7(H) 12.9(H) 7.2  Hemoglobin 12.0 - 15.0 g/dL 9.3(L) 9.0(L) 8.9(L)  Hematocrit 36.0 - 46.0 % 31.4(L) 30.8(L) 30.5(L)  Platelets 150 - 400 K/uL 347 317 353   ABG    Component Value Date/Time   PHART 7.339 (L) 01/02/2019 0827   PCO2ART 41.2 01/02/2019 0827   PO2ART 78.0 (L) 01/02/2019 0827   HCO3 22.2 01/02/2019 0827   TCO2 23 01/02/2019 0827   ACIDBASEDEF 3.0 (H) 01/02/2019 0827   O2SAT 94.0 01/02/2019 0827   CBG (last 3)  Recent Labs    01/08/19 2313 01/09/19 0318 01/09/19 0756  GLUCAP 118* 109* 114*   CC time 32 minutes    Baltazar Apo, MD, PhD 01/09/2019, 8:30 AM [facility] [phone] or if no answer [phone]

## 2019-01-09 NOTE — Progress Notes (Signed)
STROKE TEAM PROGRESS NOTE   INTERVAL HISTORY  Pt RN is at the bedside. Pt no change neurologically. Still able to open eyes on voice, left gaze preference, not following commands and right hemiplegia. Pending daughter decision on [place].    Vitals:   01/09/19 0500 01/09/19 0600 01/09/19 0800 01/09/19 0809  BP: (!) 170/89 (!) 150/73 (!) 168/83   Pulse: 85 76 82   Resp: (!) 21 19 (!) 21   Temp:    100.3 F (37.9 C)  TempSrc:    Axillary  SpO2: 97% 96% 96% 98%  Weight: 114.3 kg     Height:        CBC:  Recent Labs  Lab 01/08/19 0205 01/09/19 0239  WBC 12.9* 14.7*  HGB 9.0* 9.3*  HCT 30.8* 31.4*  MCV 82.8 82.2  PLT 317 973    Basic Metabolic Panel:  Recent Labs  Lab 01/03/19 1831 01/04/19 0526  01/06/19 0902 01/07/19 0150 01/08/19 0205 01/09/19 0239  NA  --  142   < > 146* 148* 147* 148*  K  --  3.6   < > 3.6 3.3* 3.2* 3.1*  CL  --  111   < > 108 109 109 109  CO2  --  22   < > 29 28 28 29   GLUCOSE  --  116*   < > 123* 123* 123* 129*  BUN  --  29*   < > 35* 36* 36* 29*  CREATININE  --  1.12*   < > 0.98 1.02* 0.96 0.85  CALCIUM  --  8.9   < > 9.5 9.5 9.4 9.5  MG 2.6* 2.3  --  2.3 2.4  --   --   PHOS 2.9 2.8  --   --   --   --   --    < > = values in this interval not displayed.   Lipid Panel:     Component Value Date/Time   CHOL 146 01/01/2019 0823   TRIG 115 01/02/2019 0438   HDL 30 (L) 01/01/2019 0823   CHOLHDL 4.9 01/01/2019 0823   VLDL 39 01/01/2019 0823   LDLCALC 77 01/01/2019 0823   HgbA1c:  Lab Results  Component Value Date   HGBA1C 5.7 (H) 01/27/2019   Urine Drug Screen:     Component Value Date/Time   LABOPIA NONE DETECTED 01/01/2019 2036   COCAINSCRNUR NONE DETECTED 01/20/2019 2036   COCAINSCRNUR Negative 12/28/2018 2036   LABBENZ NONE DETECTED 01/02/2019 2036   AMPHETMU NONE DETECTED 01/21/2019 2036   THCU NONE DETECTED 12/30/2018 2036   LABBARB NONE DETECTED 12/30/2018 2036   LABBARB Negative 01/19/2019 2036    Alcohol Level No results  found for: ETH  IMAGING Ct Head Wo Contrast 01/13/2019 1. No acute intracranial abnormality. 2. 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 Flow in the major left intracranial vessels is demonstrated, consistent with external to internal collateral, anterior communicating and posterior communicating contributions. There is low-density in the left parieto-occipital brain probably representing acute to subacute infarction. No correctable large or medium vessel occlusion is identified presently. Watershed pattern affects the left hemisphere with insult to the deep white matter and the left parieto-occipital cortex. Endotracheal tube in left mainstem bronchus. Dependent pulmonary infiltrate and hazy opacity elsewhere. Findings could be  due to aspiration or community acquired bacterial or viral pneumonia.   Cerebral angiogram 01/18/2019 S/P bilateral common carotid arteriogram followed by complete revascularization of occluded Lt ICA, intracranially and extracranially , and Lt MCA and Lt ACA with x 2 passes with 44mm x 50mm embotrap and x 1 pass with 20mmx 40 mm solitaire FR and x 1 pass with solitaire 46mm x 40 mm X device achieving a TICI 3 revascularization.  Mr Brain Wo Contrast 01/01/2019 1. Multifocal acute ischemia, predominantly within the left hemisphere, with the largest area of infarct located in the left anterior cerebral artery territory. 2. 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 3. 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   2D Echocardiogram  1. The left ventricle has normal systolic function with an ejection fraction of 60-65%. The cavity size was normal. There is moderate concentric left ventricular hypertrophy. Left ventricular  diastolic Doppler parameters are consistent with  pseudonormalization.  2. The right ventricle has normal systolic function. The cavity was normal. There is no increase in right ventricular wall thickness.  3. Left atrial size was mildly dilated.  4. Right atrial size was severely dilated.  5. Aortic valve regurgitation is mild by color flow Doppler. No stenosis of the aortic valve.  6. The aorta is normal in size and structure.  7. The aortic root, ascending aorta and aortic arch are normal in size and structure.  8. There is moderate dilatation of the ascending aorta.  9. The atrial septum is grossly normal.  EEG This study is suggestive of severe diffuse encephalopathy. No seizures or epileptiform discharges were seen throughout the recording.  TEE Normal size and systolic function of the left ventricle, with severe left ventricular hypertrophy. The left atrium is dilated, but there is no thrombus and no spontaneous echo contrast seen. Left atrial appendage velocities are normal. No significant valve abnormalities. Normal aorta. No evidence of intracardiac shunt by saline contrast study.  Renal US 01/03/2019 No significant or acute finding by renal ultrasound. Negative for Hydronephrosis.   PHYSICAL EXAM:  Temp:  [99 F (37.2 C)-100.4 F (38 C)] 100.3 F (37.9 C) (08/13 0809) Pulse Rate:  [72-96] 82 (08/13 0800) Resp:  [17-24] 21 (08/13 0800) BP: (141-191)/(69-95) 168/83 (08/13 0800) SpO2:  [95 %-100 %] 98 % (08/13 0809) FiO2 (%):  [30 %] 30 % (08/13 0809) Weight:  [114.3 kg] 114.3 kg (08/13 0500)  General - Well nourished, well developed, intubated not on sedation.  Ophthalmologic - fundi not visualized due to noncooperation.  Cardiovascular - Regular rate and rhythm.  Neuro - intubated not on sedation, eyes open, not following commands. Eyes in left gaze preference, not blinking to visual threat on the right, but blinking to the visual threat on the left, tracking on  the left, PERRL. Corneal reflex present, gag and cough present. Breathing over the vent.  Facial symmetry not able to test due to ET tube.  Tongue is swollen and protruded outside mouth. Spontaneous movement of LUE and LLE against gravity. On pain stimulation, RUE 0/5 and RLE 2/5 withdraw. DTR 1+ and right positive babinski. Sensation, coordination and gait not tested.   ASSESSMENT/PLAN Ms. JELIYAH MIDDLEBROOKS is a 63 y.o. female with history of hypertension, heart murmur, asthma ID presenting following a fall to the right.  Found to be hypertensive with progressive shortness of breath.  Exam found severe right hemapheresis. Not a tPA candidate. Taken to IR.  Stroke:  Multifocal left brain infarcts  s/p  IR w/ TICI3 revascularization of occluded L proximal ICA, L MCA and L ACA - embolic secondary to unknown source  CT head No acute abnormality. Old R cerebellar infarct.   CTA head & neck, CT perfusion -  occlusion L ICA at bulb.  Low density L parietal-occipital lobe with watershed pattern.    IR showed occluded L ICA intra and extra cranially - revascularized L ICA, L MCA and L ASA w/ embotrap, solitaire->TICI3  MRI multifocal L brain ischemia, mainly in the ACA territory.  Chronic small vessel disease.  Multiple chronic hypertensive micro bleeds.  R cerebellar and L occipital infarcts.  2D Echo EF 60-65%. No source of embolus   EEG no sz  TEE no SOE, LA dilated, neg bubble  May consider Loop placement if pt makes neuro improvement  LDL 77  HgbA1c 5.7  HIV negative  SARS coronavirus 2 neg  Heparin 5000 units sq tid for VTE prophylaxis  No antithrombotic prior to admission, now on aspirin 324 mg daily. Avoid DAPT for now given potential trach and PEG.   Therapy recommendations:  pending   Disposition:  pending   Waiting for family decision on [place].   Acute hypercarbic and hypoxemic respiratory failure Hx Asthma  History of intermittent asthma   intubated in ED  BNP  2574.5  Tolerated weaning so far but not able to protect airway with tongue swollen  CCM feels she will need a trach to assist weaning from vent  CCM on board  Pending daughter decision on [place].    Aspiration pneumonia w/ Enterobacter  Tmax 100.4->100.3  Sputum cx positive enterobacter  IV Rocephin - started 01/03/19 for 7 day course, D#7  CCM on board   Hypertensive emergency  Hypertension  BP on arrival 269/193  Home meds: none listed  off Cleviprex   SBP goal now < 180 . BP remains on the hight side  . Continue amlodipine 10, and increase losartan to 50 bid today . Long-term BP goal normotensive  Hyperlipidemia  Home meds:  lipitor 20, resumed in hospital  LDL 77, goal < 70  Continue statin at discharge  Dysphagia . Secondary to stroke and intubation . NPO . On tube feeding @ 50 . Speech on board   Other Stroke Risk Factors  Morbid obesity, Body mass index is 41.93 kg/m., recommend weight loss, diet and exercise as appropriate   Other Active Problems  Asthma  Acute blood loss anemia post IR Hb 13.1->10.0->9.5->8.4->8.9->9.0->9.3  Prolonged QT  Hypernatremia Na 148->147 -> on free water->148-> increase free water to 268ml Q6h  Hospital day # 9  This patient is critically ill due to stroke with ICA occlusion s/p IR thrombectomy, intubated, aspiration, hypertensive emergency and at significant risk of neurological worsening, death form recurrent stroke, hemorrhagic conversion, seizure, hypertensive encephalopathy. This patient's care requires constant monitoring of vital signs, hemodynamics, respiratory and cardiac monitoring, review of multiple databases, neurological assessment, discussion with family, other specialists and medical decision making of high complexity. I spent 30 minutes of neurocritical care time in the care of this patient. Pending daughter decision on [place].    Rosalin Hawking, MD PhD Stroke Neurology 01/09/2019 9:44 AM  To contact  Stroke Continuity provider, please refer to [email or web address] After hours, contact General Neurology

## 2019-01-10 MED ORDER — ACETAMINOPHEN 10 MG/ML IV SOLN
1000.0000 mg | Freq: Four times a day (QID) | INTRAVENOUS | Status: AC | PRN
  Administered 2019-01-11: 1000 mg via INTRAVENOUS
  Filled 2019-01-10: qty 100

## 2019-01-10 MED ORDER — LORAZEPAM 2 MG/ML IJ SOLN
1.0000 mg | INTRAMUSCULAR | Status: DC | PRN
  Administered 2019-01-10: 1 mg via INTRAVENOUS
  Filled 2019-01-10: qty 1

## 2019-01-10 MED ORDER — LORAZEPAM 1 MG PO TABS: 1.0000 mg | ORAL_TABLET | ORAL | Status: DC | PRN

## 2019-01-10 MED ORDER — LORAZEPAM 2 MG/ML PO CONC: 1.0000 mg | ORAL | Status: DC | PRN

## 2019-01-10 NOTE — Social Work (Signed)
CSW acknowledging pt under comfort care. Will follow for disposition should hospice services or residential hospice become appropriate.   Emmely Bittinger H Nazariah Cadet, LCSWA [place] Clinical Social Work [phone]   

## 2019-01-10 NOTE — Progress Notes (Signed)
STROKE TEAM PROGRESS NOTE   INTERVAL HISTORY  Pt daughter is at the bedside. Pt on morphine drip and appears comfortable. earlier today, pt had some agitation and received haldol. SW on board for potential placement.   Vitals:   01/09/19 1600 01/09/19 1700 01/09/19 1800 01/10/19 0434  BP:    (!) 164/79  Pulse: (!) 103 (!) 104 (!) 105 91  Resp: 20 19 19 12   Temp:    (!) 101.3 F (38.5 C)  TempSrc:    Axillary  SpO2: (!) 81% (!) 80% (!) 79% (!) 70%  Weight:      Height:        CBC:  Recent Labs  Lab 01/08/19 0205 01/09/19 0239  WBC 12.9* 14.7*  HGB 9.0* 9.3*  HCT 30.8* 31.4*  MCV 82.8 82.2  PLT 317 564    Basic Metabolic Panel:  Recent Labs  Lab 01/03/19 1831 01/04/19 0526  01/06/19 0902 01/07/19 0150 01/08/19 0205 01/09/19 0239  NA  --  142   < > 146* 148* 147* 148*  K  --  3.6   < > 3.6 3.3* 3.2* 3.1*  CL  --  111   < > 108 109 109 109  CO2  --  22   < > 29 28 28 29   GLUCOSE  --  116*   < > 123* 123* 123* 129*  BUN  --  29*   < > 35* 36* 36* 29*  CREATININE  --  1.12*   < > 0.98 1.02* 0.96 0.85  CALCIUM  --  8.9   < > 9.5 9.5 9.4 9.5  MG 2.6* 2.3  --  2.3 2.4  --   --   PHOS 2.9 2.8  --   --   --   --   --    < > = values in this interval not displayed.   Lipid Panel:     Component Value Date/Time   CHOL 146 01/01/2019 0823   TRIG 115 01/02/2019 0438   HDL 30 (L) 01/01/2019 0823   CHOLHDL 4.9 01/01/2019 0823   VLDL 39 01/01/2019 0823   LDLCALC 77 01/01/2019 0823   HgbA1c:  Lab Results  Component Value Date   HGBA1C 5.7 (H) 01/10/2019   Urine Drug Screen:     Component Value Date/Time   LABOPIA NONE DETECTED 12/30/2018 2036   COCAINSCRNUR NONE DETECTED 01/21/2019 2036   COCAINSCRNUR Negative 01/21/2019 2036   LABBENZ NONE DETECTED 01/19/2019 2036   AMPHETMU NONE DETECTED 01/24/2019 2036   THCU NONE DETECTED 01/02/2019 2036   LABBARB NONE DETECTED 01/24/2019 2036   LABBARB Negative 01/14/2019 2036    Alcohol Level No results found for:  ETH  IMAGING Ct Head Wo Contrast 01/10/2019 1. No acute intracranial abnormality. 2. 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 Flow in the major left intracranial vessels is demonstrated, consistent with external to internal collateral, anterior communicating and posterior communicating contributions. There is low-density in the left parieto-occipital brain probably representing acute to subacute infarction. No correctable large or medium vessel occlusion is identified presently. Watershed pattern affects the left hemisphere with insult to the deep white matter and the left parieto-occipital cortex. Endotracheal tube in left mainstem bronchus. Dependent pulmonary infiltrate and hazy opacity elsewhere. Findings could be due  to aspiration or community acquired bacterial or viral pneumonia.   Cerebral angiogram 01/05/2019 S/P bilateral common carotid arteriogram followed by complete revascularization of occluded Lt ICA, intracranially and extracranially , and Lt MCA and Lt ACA with x 2 passes with 63mm x 54mm embotrap and x 1 pass with 64mmx 40 mm solitaire FR and x 1 pass with solitaire 56mm x 40 mm X device achieving a TICI 3 revascularization.  Mr Brain Wo Contrast 01/01/2019 1. Multifocal acute ischemia, predominantly within the left hemisphere, with the largest area of infarct located in the left anterior cerebral artery territory. 2. 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 3. 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   2D Echocardiogram  1. The left ventricle has normal systolic function with an ejection fraction of 60-65%. The cavity size was normal. There is moderate concentric left ventricular hypertrophy. Left ventricular diastolic  Doppler parameters are consistent with  pseudonormalization.  2. The right ventricle has normal systolic function. The cavity was normal. There is no increase in right ventricular wall thickness.  3. Left atrial size was mildly dilated.  4. Right atrial size was severely dilated.  5. Aortic valve regurgitation is mild by color flow Doppler. No stenosis of the aortic valve.  6. The aorta is normal in size and structure.  7. The aortic root, ascending aorta and aortic arch are normal in size and structure.  8. There is moderate dilatation of the ascending aorta.  9. The atrial septum is grossly normal.  EEG This study is suggestive of severe diffuse encephalopathy. No seizures or epileptiform discharges were seen throughout the recording.  TEE Normal size and systolic function of the left ventricle, with severe left ventricular hypertrophy. The left atrium is dilated, but there is no thrombus and no spontaneous echo contrast seen. Left atrial appendage velocities are normal. No significant valve abnormalities. Normal aorta. No evidence of intracardiac shunt by saline contrast study.  Renal US 01/03/2019 No significant or acute finding by renal ultrasound. Negative for Hydronephrosis.   PHYSICAL EXAM:  Temp:  [101.3 F (38.5 C)] 101.3 F (38.5 C) (08/14 0434) Pulse Rate:  [91-106] 91 (08/14 0434) Resp:  [12-23] 12 (08/14 0434) BP: (164)/(79) 164/79 (08/14 0434) SpO2:  [70 %-96 %] 70 % (08/14 0434) FiO2 (%):  [30 %] 30 % (08/13 1235)  Exam limited due to comfort care measures  General - Well nourished, well developed, comatose but appears comfort on morphine drip. Pulse regular rate and rhythm.  Neuro - comatose, eyes closed, not following commands, not responsive to voice. Breathing comfortable and on Cheyne stokes pattern. Mild right facial droop.  No spontaneous movement in all extremities. Sensation, coordination and gait not tested.   ASSESSMENT/PLAN Alejandra Vega is a 63 y.o. female with history of hypertension, heart murmur, asthma ID presenting following a fall to the right.  Found to be hypertensive with progressive shortness of breath.  Exam found severe right hemapheresis. Not a tPA candidate. Taken to IR.  Stroke:  Multifocal left brain infarcts  s/p IR w/ TICI3 revascularization of occluded L proximal ICA, L MCA and L ACA - embolic secondary to unknown source  CT head No acute abnormality. Old R cerebellar infarct.   CTA head & neck, CT perfusion -  occlusion L ICA at bulb.  Low density L parietal-occipital lobe with watershed pattern.    IR showed occluded L ICA intra and extra cranially - revascularized L ICA, L MCA and  L ASA w/ embotrap, solitaire->TICI3  MRI multifocal L brain ischemia, mainly in the ACA territory.  Chronic small vessel disease.  Multiple chronic hypertensive micro bleeds.  R cerebellar and L occipital infarcts.  2D Echo EF 60-65%. No source of embolus   EEG no sz  TEE no SOE, LA dilated, neg bubble  LDL 77  HgbA1c 5.7  HIV negative  SARS coronavirus 2 neg  No antithrombotic prior to admission, now on no antithrombotics due to comfort care  SW on board for potential placement, but at this time expect hospital death   Acute hypercarbic and hypoxemic respiratory failure Hx Asthma  History of intermittent asthma   intubated in ED  BNP 2574.5  Tolerated weaning so far but not able to protect airway with tongue swollen  Terminally weaned off vent 01/09/19    Aspiration pneumonia w/ Enterobacter  Tmax 100.4->100.3  Sputum cx positive enterobacter  IV Rocephin - started 01/03/19 competed 7 day course  Hypertensive emergency  Hypertension  BP on arrival 269/193  Home meds: none listed  off Cleviprex   Hyperlipidemia  Home meds:  lipitor 20  LDL 77, goal < 70  Dysphagia . Secondary to stroke and intubation . NPO   Other Stroke Risk Factors  Morbid obesity, Body mass index is  41.93 kg/m., recommend weight loss, diet and exercise as appropriate   Other Active Problems  Asthma  Acute blood loss anemia post IR Hb 13.1->10.0->9.5->8.4->8.9->9.0->9.3  Prolonged QT  Hypernatremia Na 148->147 -> on free water->148  Hospital day # 10  Alejandra Hawking, Alejandra Vega Stroke Neurology 01/10/2019 12:02 PM  To contact Stroke Continuity provider, please refer to [email or web address] After hours, contact General Neurology

## 2019-01-10 NOTE — Progress Notes (Signed)
Nutrition Brief Note  Chart reviewed. Pt now transitioning to comfort care.  No further nutrition interventions warranted at this time.  Please re-consult as needed.   Rashard Ryle A. Merion Caton, RD, LDN, CDCES Registered Dietitian II Certified Diabetes Care and Education Specialist Pager: [phone] After hours Pager: [phone]  

## 2019-01-13 ENCOUNTER — Telehealth: Payer: Self-pay | Admitting: *Deleted

## 2019-01-13 NOTE — Telephone Encounter (Signed)
Spoke with pt's daughter Fulton Mole who stated pt passed on 02-06-2019. Per Dr. Jaynee Eagles, FMLA forms to be completed for daughter tending to pt's needs/visitation during pt's hospitalization 01/02/2019-02-06-2019.   Forms completed, signed, and sent to medical records for processing.

## 2019-01-28 NOTE — Progress Notes (Signed)
STROKE TEAM PROGRESS NOTE   INTERVAL HISTORY  Pt `s sister  is at the bedside. Pt on morphine drip and appears in mild respiratory distress.  Vital signs appear stable.  Temperature is elevated.  SW on board for potential placement.   Vitals:   01/09/19 1800 01/10/19 0434 01/10/19 1400 01/14/2019 0539  BP:  (!) 164/79 (!) 159/68 117/61  Pulse: (!) 105 91 90 98  Resp: 19 12 12 12   Temp:  (!) 101.3 F (38.5 C) 100.1 F (37.8 C) (!) 102.7 F (39.3 C)  TempSrc:  Axillary Oral Axillary  SpO2: (!) 79% (!) 70% (!) 74% (!) 54%  Weight:      Height:        CBC:  Recent Labs  Lab 01/08/19 0205 01/09/19 0239  WBC 12.9* 14.7*  HGB 9.0* 9.3*  HCT 30.8* 31.4*  MCV 82.8 82.2  PLT 317 469    Basic Metabolic Panel:  Recent Labs  Lab 01/06/19 0902 01/07/19 0150 01/08/19 0205 01/09/19 0239  NA 146* 148* 147* 148*  K 3.6 3.3* 3.2* 3.1*  CL 108 109 109 109  CO2 29 28 28 29   GLUCOSE 123* 123* 123* 129*  BUN 35* 36* 36* 29*  CREATININE 0.98 1.02* 0.96 0.85  CALCIUM 9.5 9.5 9.4 9.5  MG 2.3 2.4  --   --    Lipid Panel:     Component Value Date/Time   CHOL 146 01/01/2019 0823   TRIG 115 01/02/2019 0438   HDL 30 (L) 01/01/2019 0823   CHOLHDL 4.9 01/01/2019 0823   VLDL 39 01/01/2019 0823   LDLCALC 77 01/01/2019 0823   HgbA1c:  Lab Results  Component Value Date   HGBA1C 5.7 (H) 01/07/2019   Urine Drug Screen:     Component Value Date/Time   LABOPIA NONE DETECTED 01/07/2019 2036   COCAINSCRNUR NONE DETECTED 01/10/2019 2036   COCAINSCRNUR Negative 01/13/2019 2036   LABBENZ NONE DETECTED 01/09/2019 2036   AMPHETMU NONE DETECTED 01/10/2019 2036   THCU NONE DETECTED 01/18/2019 2036   LABBARB NONE DETECTED 01/01/2019 2036   LABBARB Negative 01/05/2019 2036    Alcohol Level No results found for: ETH  IMAGING Ct Head Wo Contrast 01/21/2019 1. No acute intracranial abnormality.  2. 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 Flow in the major left intracranial vessels is demonstrated, consistent with external to internal collateral, anterior communicating and posterior communicating contributions. There is low-density in the left parieto-occipital brain probably representing acute to subacute infarction. No correctable large or medium vessel occlusion is identified presently. Watershed pattern affects the left hemisphere with insult to the deep white matter and the left parieto-occipital cortex. Endotracheal tube in left mainstem bronchus. Dependent pulmonary infiltrate and hazy opacity elsewhere. Findings could be due to aspiration or community acquired bacterial or viral pneumonia.   Cerebral angiogram 01/03/2019 S/P bilateral common carotid arteriogram followed by complete revascularization of occluded Lt ICA, intracranially and extracranially , and Lt MCA and Lt ACA with x 2 passes with 11mm x 23mm embotrap and x 1 pass with 97mmx 40 mm solitaire FR and x 1 pass with solitaire 53mm x 40 mm X device achieving a TICI 3 revascularization.  Mr Brain Wo Contrast 01/01/2019 1. Multifocal acute ischemia, predominantly within the left hemisphere, with the largest  area of infarct located in the left anterior cerebral artery territory. 2. 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 3. 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   2D Echocardiogram  1. The left ventricle has normal systolic function with an ejection fraction of 60-65%. The cavity size was normal. There is moderate concentric left ventricular hypertrophy. Left ventricular diastolic Doppler parameters are consistent with  pseudonormalization.  2. The right ventricle has normal systolic function. The cavity was normal. There is no increase in right ventricular wall  thickness.  3. Left atrial size was mildly dilated.  4. Right atrial size was severely dilated.  5. Aortic valve regurgitation is mild by color flow Doppler. No stenosis of the aortic valve.  6. The aorta is normal in size and structure.  7. The aortic root, ascending aorta and aortic arch are normal in size and structure.  8. There is moderate dilatation of the ascending aorta.  9. The atrial septum is grossly normal.  EEG This study is suggestive of severe diffuse encephalopathy. No seizures or epileptiform discharges were seen throughout the recording.  TEE Normal size and systolic function of the left ventricle, with severe left ventricular hypertrophy. The left atrium is dilated, but there is no thrombus and no spontaneous echo contrast seen. Left atrial appendage velocities are normal. No significant valve abnormalities. Normal aorta. No evidence of intracardiac shunt by saline contrast study.  Renal US 01/03/2019 No significant or acute finding by renal ultrasound. Negative for Hydronephrosis.   PHYSICAL EXAM:  Temp:  [100.1 F (37.8 C)-102.7 F (39.3 C)] 102.7 F (39.3 C) (08/15 0539) Pulse Rate:  [90-98] 98 (08/15 0539) Resp:  [12] 12 (08/15 0539) BP: (117-159)/(61-68) 117/61 (08/15 0539) SpO2:  [54 %-74 %] 54 % (08/15 0539)  Exam limited due to comfort care measures  General - Well nourished, well developed, comatose but appears comfort on morphine drip. Pulse regular rate and rhythm.  Neuro - comatose, eyes closed, not following commands, not responsive to voice. Breathing comfortable and on Cheyne stokes pattern. Mild right facial droop.  No spontaneous movement in all extremities. Sensation, coordination and gait not tested.   ASSESSMENT/PLAN [name] is a 63 y.o. female with history of hypertension, heart murmur, asthma ID presenting following a fall to the right.  Found to be hypertensive with progressive shortness of breath.  Exam found severe  right hemapheresis. Not a tPA candidate. Taken to IR.  Stroke:  Multifocal left brain infarcts  s/p IR w/ TICI3 revascularization of occluded L proximal ICA, L MCA and L ACA - embolic secondary to unknown source  CT head No acute abnormality. Old R cerebellar infarct.   CTA head & neck, CT perfusion -  occlusion L ICA at bulb.  Low density L parietal-occipital lobe with watershed pattern.    IR showed occluded L ICA intra and extra cranially - revascularized L ICA, L MCA and L ASA w/ embotrap, solitaire->TICI3  MRI multifocal L brain ischemia, mainly in the ACA territory.  Chronic small vessel disease.  Multiple chronic hypertensive micro bleeds.  R cerebellar and L occipital infarcts.  2D Echo EF 60-65%. No source of embolus   EEG no sz  TEE no SOE, LA dilated, neg bubble  LDL 77  HgbA1c 5.7  HIV negative  SARS coronavirus 2 neg  No antithrombotic prior to admission, now on no antithrombotics due to comfort care  SW on board for potential placement, but at this time expect hospital  death   Acute hypercarbic and hypoxemic respiratory failure Hx Asthma  History of intermittent asthma   intubated in ED  BNP 2574.5  Tolerated weaning so far but not able to protect airway with tongue swollen  Terminally weaned off vent 01/09/19    Aspiration pneumonia w/ Enterobacter  Tmax 100.4->100.3->102.7 ax  O2 Sat - 74%->54%  Sputum cx positive enterobacter  IV Rocephin - started 01/03/19 competed 7 day course  Hypertensive emergency  Hypertension  BP on arrival 269/193  Home meds: none listed  off Cleviprex   Hyperlipidemia  Home meds:  lipitor 20  LDL 77, goal < 70  Dysphagia . Secondary to stroke and intubation . NPO   Other Stroke Risk Factors  Morbid obesity, Body mass index is 41.93 kg/m., recommend weight loss, diet and exercise as appropriate   Other Active Problems  Asthma  Acute blood loss anemia post IR Hb  13.1->10.0->9.5->8.4->8.9->9.0->9.3  Prolonged QT  Hypernatremia Na 148->147 -> on free water->148  Hospital day # 11 Patient is full comfort care measures only.  She is resting peacefully on morphine drip.  I spoke to the patient's family at the bedside and advise plan of care.  They seem satisfied.  Continue present treatment. Antony Contras, MD  To contact Stroke Continuity provider, please refer to [email or web address] After hours, contact General Neurology

## 2019-01-28 NOTE — Progress Notes (Signed)
[facility] called with reference no. [id number] per Retia Passe

## 2019-01-28 NOTE — Progress Notes (Signed)
Called to patient room, no respiration, no Hr noted at this time. Another RN verfied assessment. MD made aware. Emotional support given to the family.

## 2019-01-28 NOTE — Discharge Summary (Signed)
Patient ID: Alejandra Vega MRN: [id number] DOB/AGE: June 17, 1955 63 y.o.  Admit date: 01/08/19 Death date: 01-21-2019  Admission Diagnoses: stroke  Cause of Death: Respiratory failure secondary to aspiration pneumonia due to large left hemispheric infarct due to left carotid artery occlusion status post mechanical thrombectomy.  Patient made DNR and comfort care by family and ventilatory support withdrawn  Pertinent Medical Diagnosis: Active Problems:   Acute metabolic encephalopathy   Middle cerebral artery embolism, left   Acute respiratory failure [facility])    Hospital Course:  Alejandra Vega is a 63 y.o. female with hypertension, heart murmur, asthma, anxiety and anemia with pregnancy.  Per emergency room MD, the history is that of patient had fallen to the right to the point where she did leave a dent in the drywall.  At that point patient called  her daughter 10 am and stated that she was short of breath.  EMS arrived and noted that her blood pressure was significantly high.  Upon entering the ED she continued to complain that she was short of breath.  Blood pressure was found to be as high as 178/113 and 196/111. CT-scan of the brain- no acute intracranial abnormalities, mild chronic microvascular ischemic changes including prior right cerebellar infarct.CTA revealed left  carotid occlusion.The perfusion imaging does reveal an area of penumbra, and though there may be slightly overestimate due to infarct, given the severe symptoms and small area of CT based infarct,. She underwent emergentbilateral common carotid arteriogram followed by complete revascularization of occluded Lt ICA, intracranially and extracranially , and Lt MCA and Lt ACA with x 2 passes with 69mm x 11mm embotrap and x 1 pass with 38mmx 40 mm solitaire FR and x 1 pass with solitaire 29mm x 40 mm X device achieving a TICI 3 revascularization.  MRI scan of the brain showed Multifocal acute ischemia, predominantly within the  left hemisphere, with the largest area of infarct located in the left anterior cerebral artery territory.2D Echo EF 60-65%. No source of embolus .EEG no sz..TEE no SOE, LA dilated, neg bubble.LDL 77.HgbA1c 5.7Patient's hospital course was complicated by aspiration pneumonia due to Enterobacter.  Patient was unable to tolerate weaning.  It was clear that she needed prolonged ventilatory support tracheostomy PEG tube and nursing home care patient family did not want that.  They made the patient DNR and comfort care and ventilatory support was withdrawn on 01/09/2019.  She was kept comfortable on IV morphine drip.  Her condition gradually declined and she passed away peacefully on January 19, 2019 at 2:13 PM. Signed: Antony Contras 2019/01/21, 1:11 PM

## 2019-01-28 DEATH — deceased

## 2020-01-01 IMAGING — CT CT CHEST WITHOUT CONTRAST
2 of 3 series · 15 of 36 positions shown, 18 images · non-contrast
Comparison: Chest radiograph, 07/17/2018

CLINICAL DATA: Shortness of breath, dyspnea

EXAM:
CT CHEST WITHOUT CONTRAST
TECHNIQUE: Multidetector CT imaging of the chest was performed following the
standard protocol without IV contrast.

[Series 2: thorax · axial · 0.81mm/px · z∈[+1280,+1502]mm · 12 of 131 slices shown, 15 images]
[im 10/131  mediastinal]
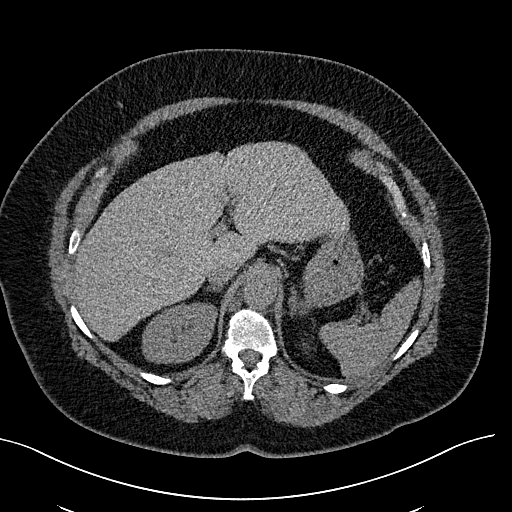
[im 10/131  lung]
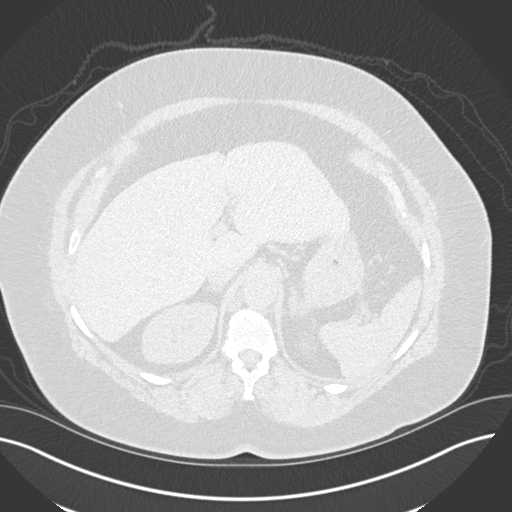
[im 20/131  lung]
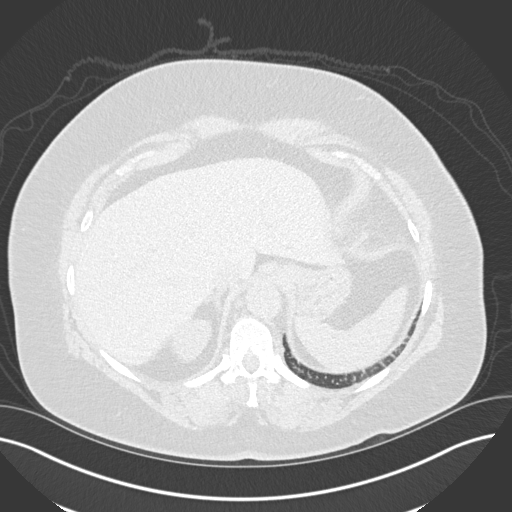
[im 29/131  lung]
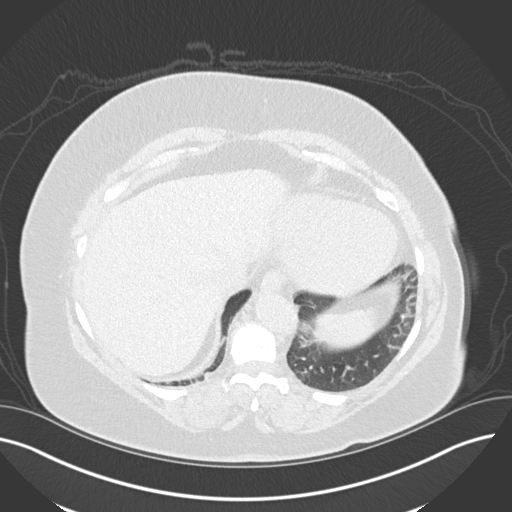
[im 39/131  lung]
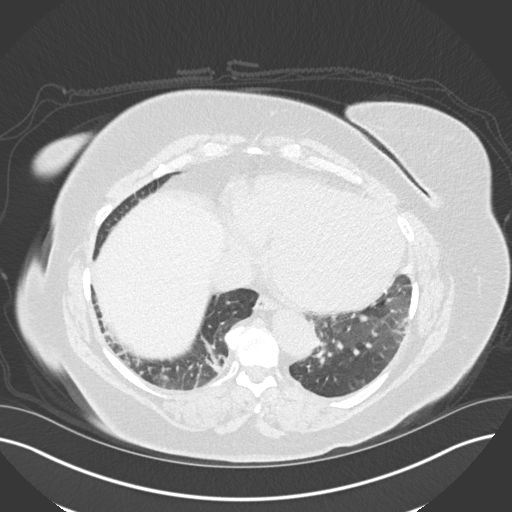
[im 49/131  mediastinal]
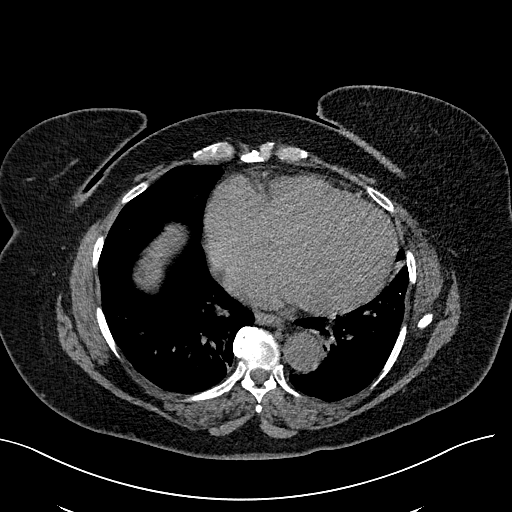
[im 49/131  lung]
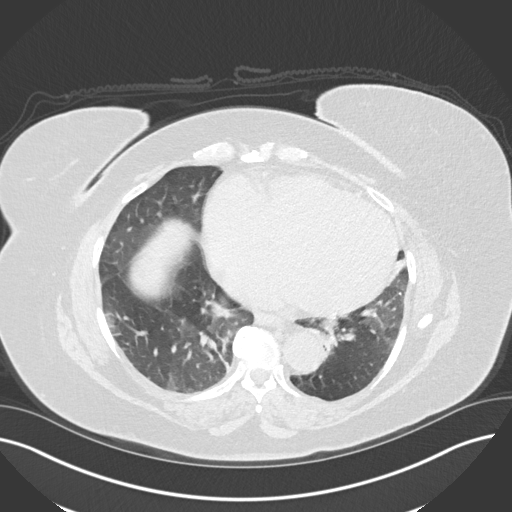
[im 58/131  lung]
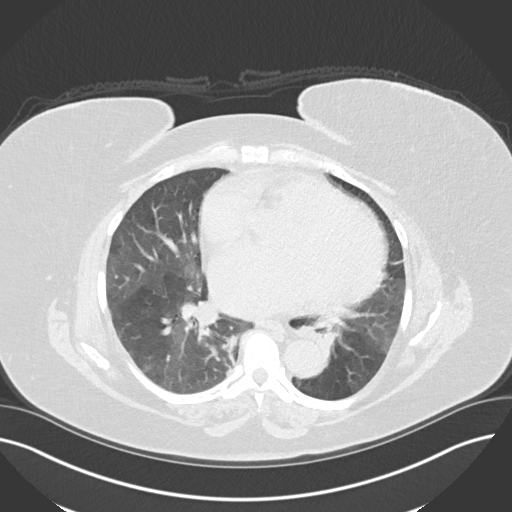
[im 73/131  lung]
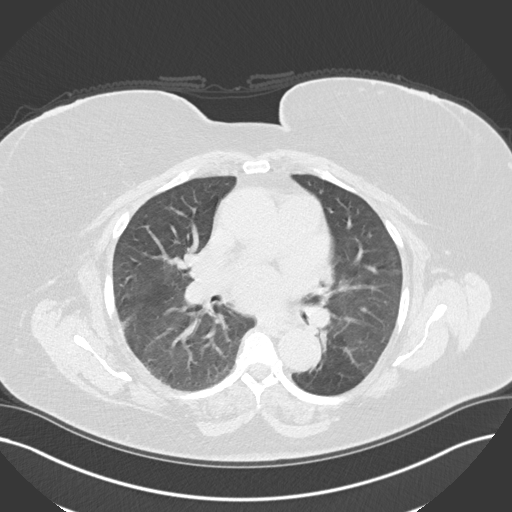
[im 82/131  lung]
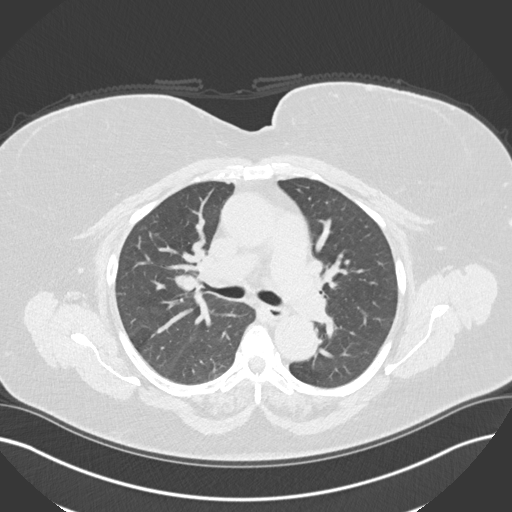
[im 92/131  mediastinal]
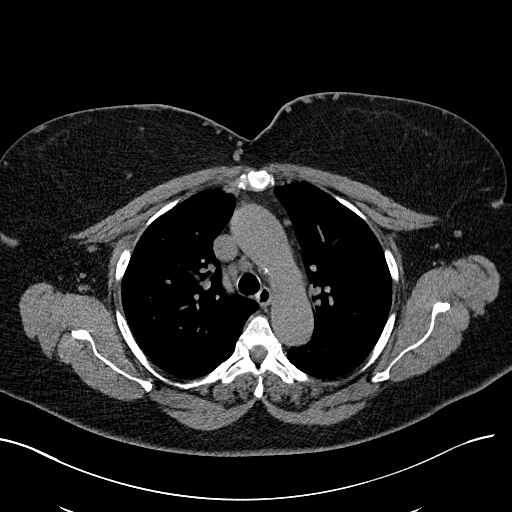
[im 92/131  lung]
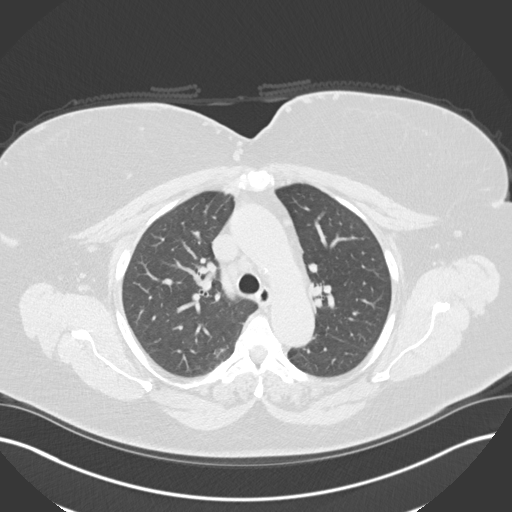
[im 102/131  lung]
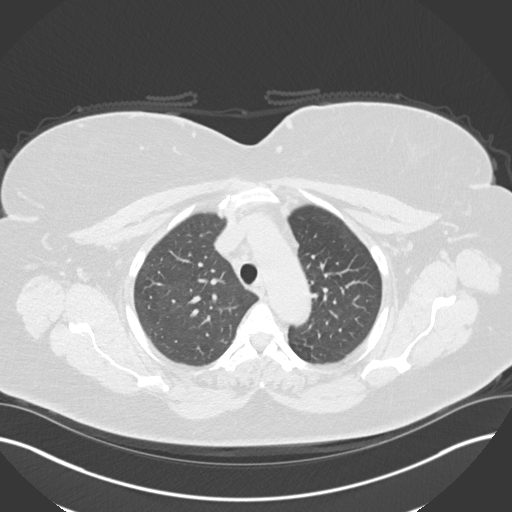
[im 111/131  lung]
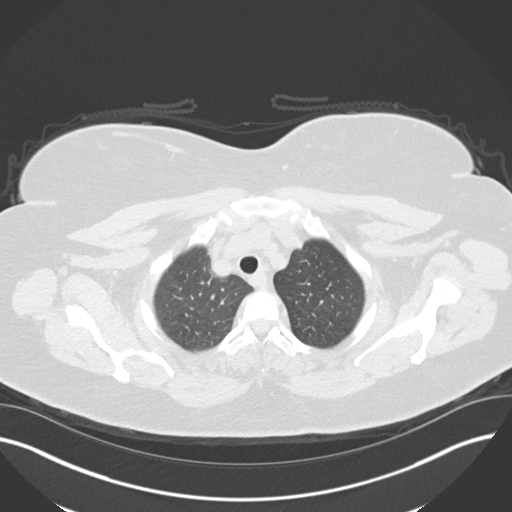
[im 121/131  lung]
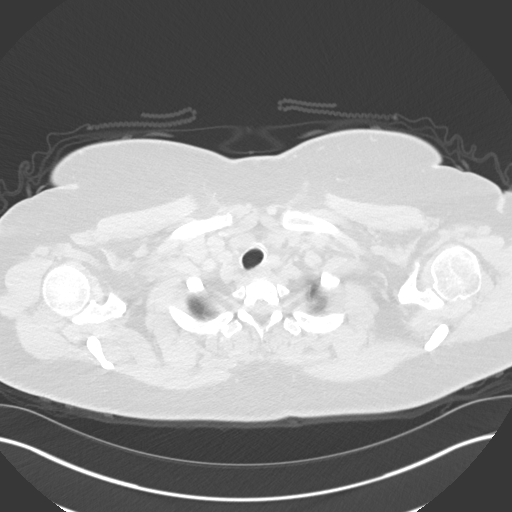

[Series 5: coronal · coronal · 0.53mm/px · 3 of 134 slices shown]
[im 27/134  lung]
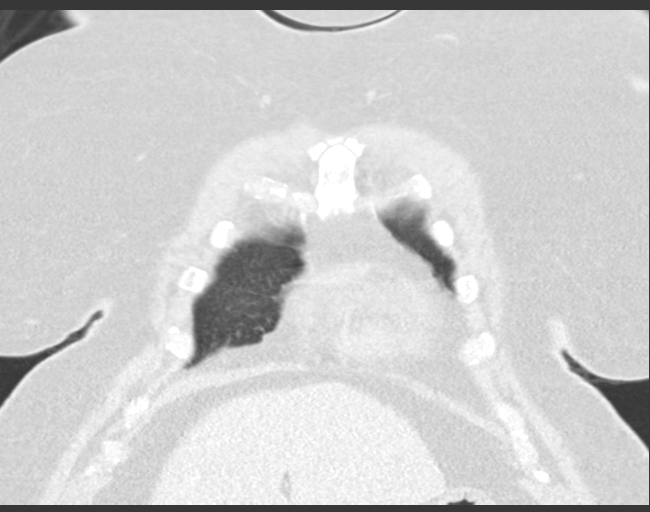
[im 54/134  lung]
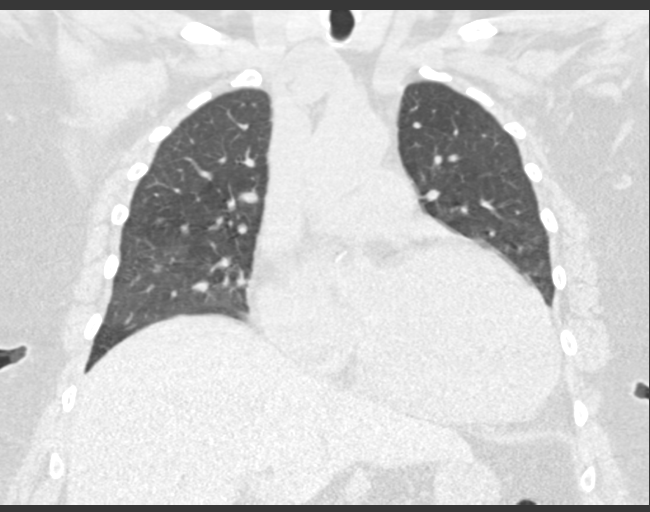
[im 80/134  lung]
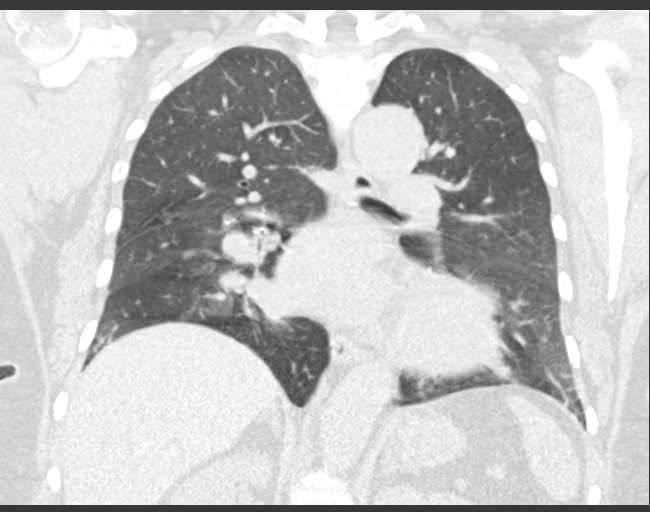

[15 of 36 positions shown; findings below may reference images not displayed]

FINDINGS: Cardiovascular: Cardiomegaly. Scattered coronary artery
calcifications. No pericardial effusion. There appears to be
narrowing of the right brachiocephalic vein and superficial venous
collateralization about the anterior and right chest wall.

Mediastinum/Nodes: No enlarged mediastinal, hilar, or axillary lymph
nodes. Thyroid gland, trachea, and esophagus demonstrate no
significant findings.

Lungs/Pleura: Mild bibasilar atelectasis or scarring. There is some
geographic attenuation of the airspaces. No pleural effusion or
pneumothorax.

Upper Abdomen: No acute abnormality.

Musculoskeletal: No chest wall mass or suspicious bone lesions
identified.
IMPRESSION: 1. Mild bibasilar atelectasis or scarring. There is some geographic
attenuation of the airspaces suggestive of small airways disease. No
evidence of fibrotic interstitial lung disease.

2.  Cardiomegaly.

3. There appears to be narrowing of the right brachiocephalic vein
and superficial venous collateralization about the anterior and
right chest wall. Correlate with relevant clinical history, such as
history of central venous catheterization. Patency of the upper
extremity veins could be initially further evaluated by ultrasound
if there are relevant clinical symptoms such as vascular congestion
of the right upper extremity.

## 2020-05-26 IMAGING — DX PORTABLE CHEST - 1 VIEW
1 series · 1 of 1 positions shown · non-contrast
Comparison: January 07, 2019

CLINICAL DATA: Hypoxia

EXAM:
PORTABLE CHEST 1 VIEW

[chest ap]
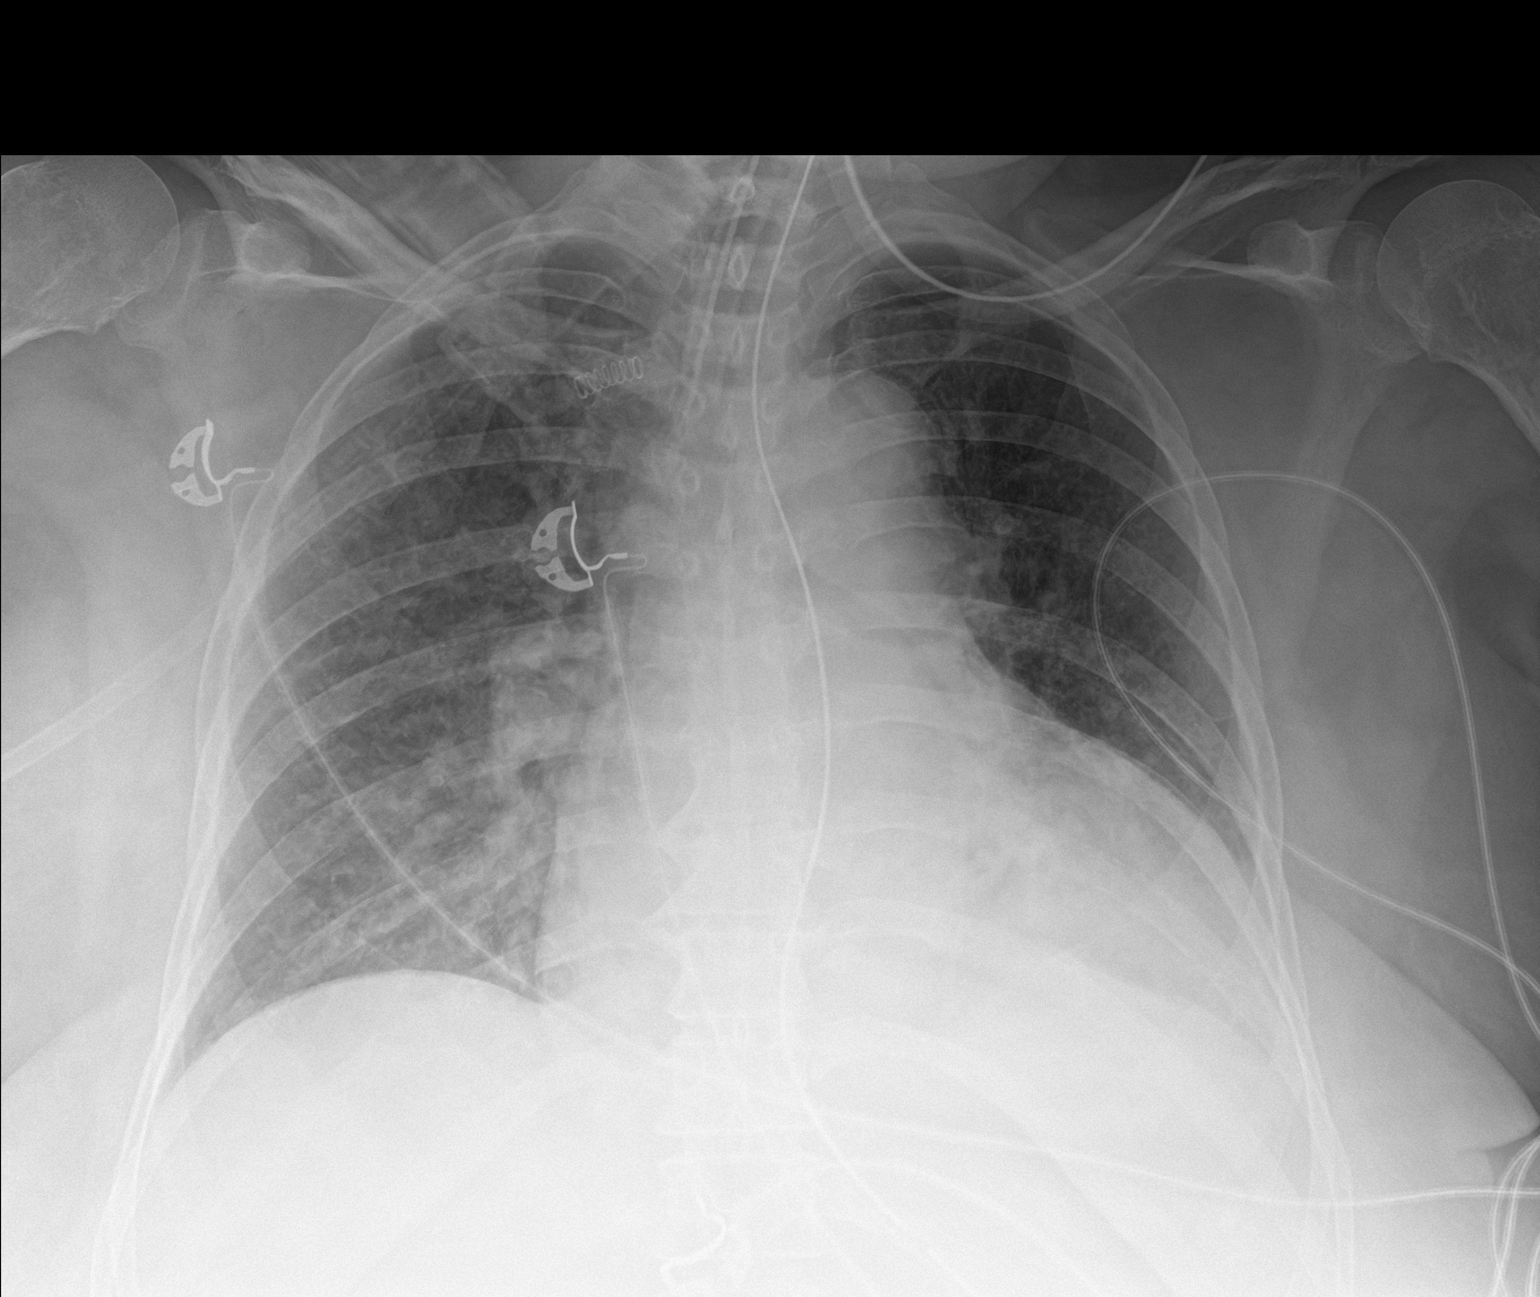

[1 of 1 positions shown; findings below may reference images not displayed]

FINDINGS: Endotracheal tube tip is 3.6 cm above the carina. Nasogastric tube
tip and side port are below the diaphragm. No pneumothorax. There is
consolidation in the left lower lobe with minimal left pleural
effusion. Lungs elsewhere are clear. Heart is enlarged with
pulmonary venous hypertension. No adenopathy. No bone lesions.
IMPRESSION: Pulmonary vascular congestion. Patchy airspace consolidation
concerning for pneumonia left lower lobe with minimal left pleural
effusion. Lungs elsewhere clear. Tube positions as described without
evident pneumothorax.
